# Patient Record
Sex: Female | Born: 1995 | Race: Black or African American | Hispanic: No | Marital: Single | Smoking: Never smoker
Health system: Southern US, Community
[De-identification: ages and names within clinical notes are randomized; demographics above are authoritative.]

## PROBLEM LIST (undated history)

## (undated) ENCOUNTER — Inpatient Hospital Stay (HOSPITAL_COMMUNITY): Payer: Self-pay

## (undated) DIAGNOSIS — Z9109 Other allergy status, other than to drugs and biological substances: Secondary | ICD-10-CM

## (undated) DIAGNOSIS — L309 Dermatitis, unspecified: Secondary | ICD-10-CM

## (undated) DIAGNOSIS — K8020 Calculus of gallbladder without cholecystitis without obstruction: Secondary | ICD-10-CM

## (undated) DIAGNOSIS — J302 Other seasonal allergic rhinitis: Secondary | ICD-10-CM

## (undated) DIAGNOSIS — B999 Unspecified infectious disease: Secondary | ICD-10-CM

## (undated) DIAGNOSIS — K5900 Constipation, unspecified: Secondary | ICD-10-CM

## (undated) DIAGNOSIS — R011 Cardiac murmur, unspecified: Secondary | ICD-10-CM

## (undated) DIAGNOSIS — B9689 Other specified bacterial agents as the cause of diseases classified elsewhere: Secondary | ICD-10-CM

## (undated) DIAGNOSIS — N760 Acute vaginitis: Secondary | ICD-10-CM

## (undated) HISTORY — PX: TONSILLECTOMY: SUR1361

## (undated) HISTORY — PX: ADENOIDECTOMY: SUR15

## (undated) HISTORY — PX: THERAPEUTIC ABORTION: SHX798

## (undated) HISTORY — DX: Dermatitis, unspecified: L30.9

---
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
## 2016-02-17 IMAGING — CR DG ABDOMEN ACUTE W/ 1V CHEST
3 series · 3 of 3 positions shown · non-contrast
Comparison: None.

CLINICAL DATA: Abdominal pain, headache

EXAM:
DG ABDOMEN ACUTE W/ 1V CHEST

[w chest pa]
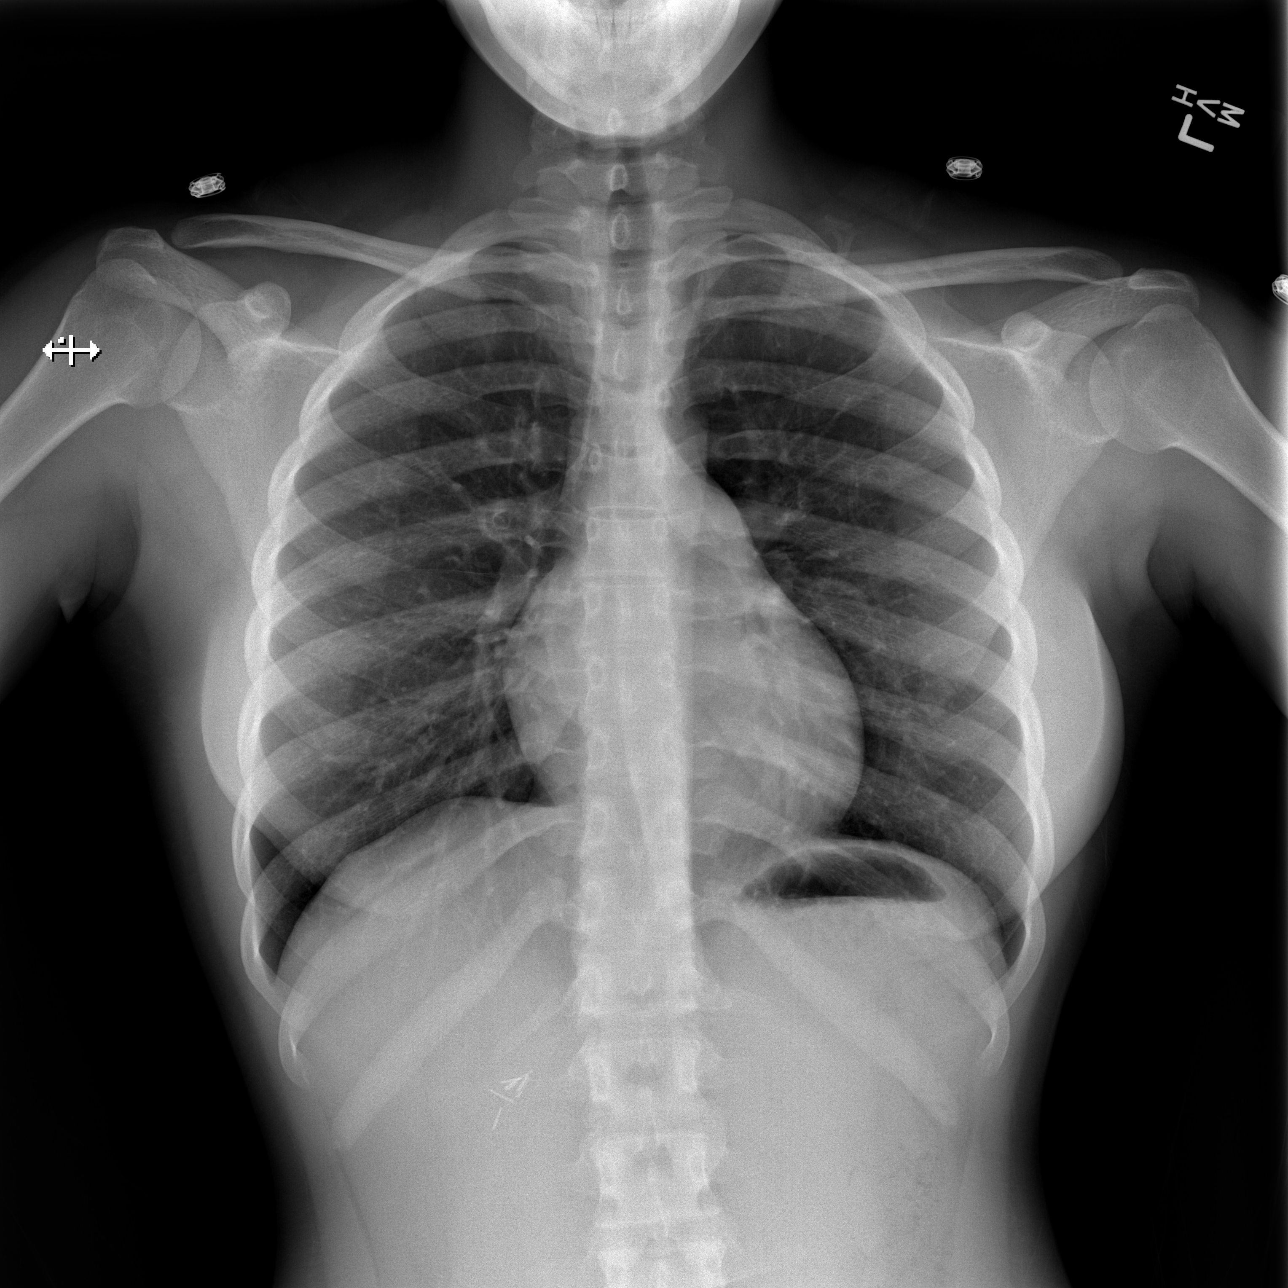

[w abdomen upright]
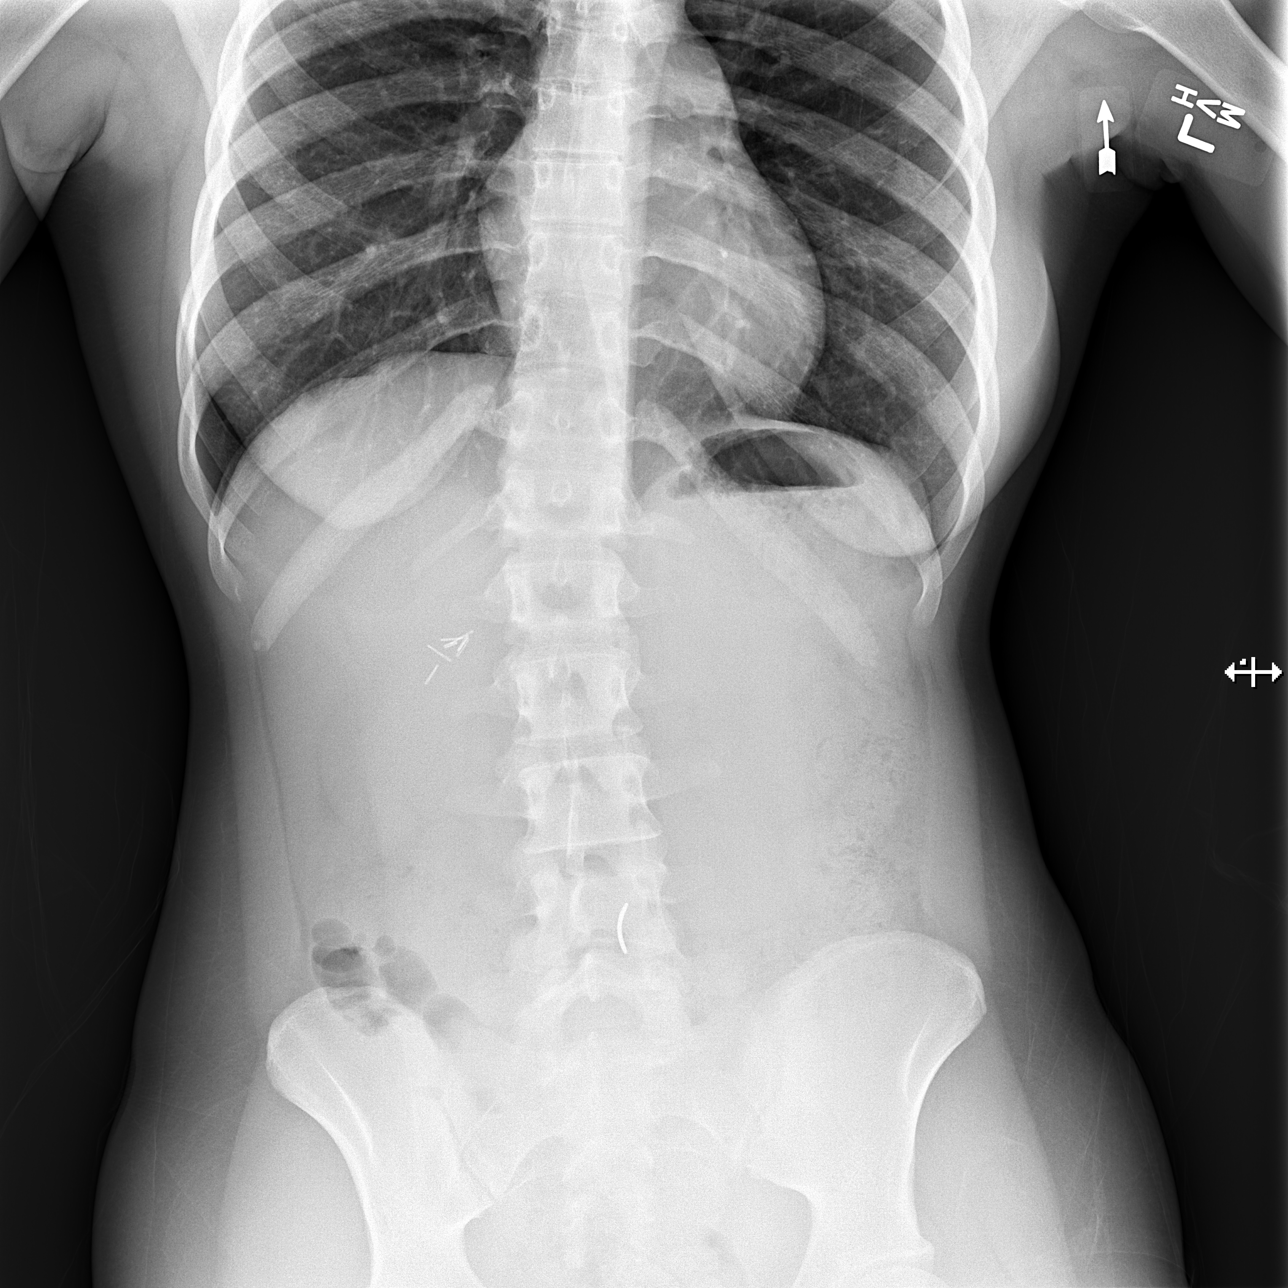

[t abdomen supine]
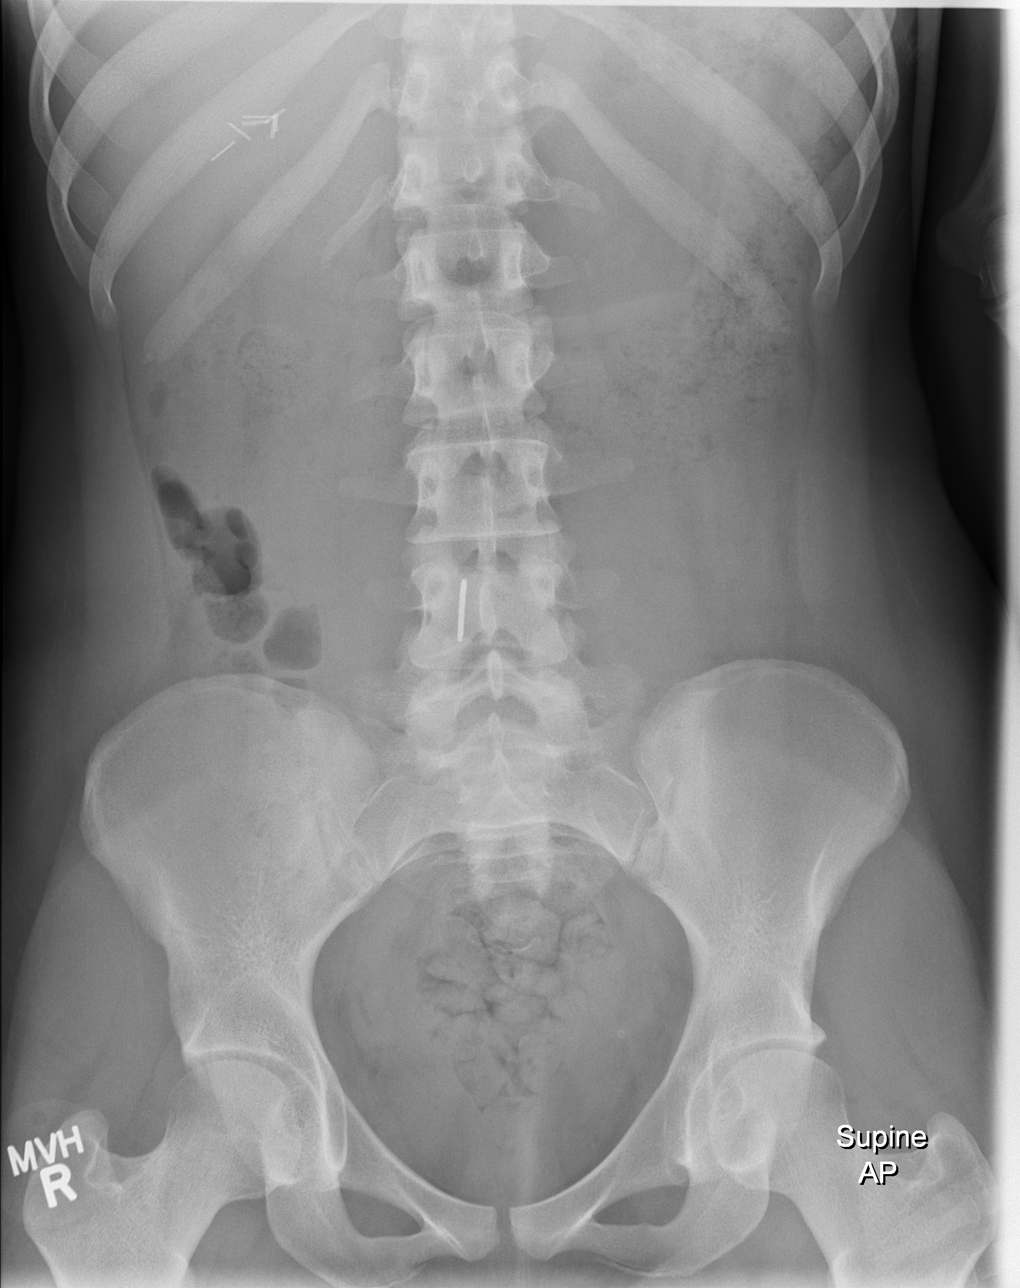

[3 of 3 positions shown; findings below may reference images not displayed]

FINDINGS: There is no evidence of dilated bowel loops or free intraperitoneal
air. Heart size and mediastinal contours are within normal limits.
Both lungs are clear. Postcholecystectomy surgical clips are noted.
Moderate stool noted in transverse colon. There is a metallic
artifact in mid lower abdomen is measures 1.9 cm. This may be in
umbilical region. Clinical correlation is necessary. Moderate stool
noted within rectum.
IMPRESSION: Moderate stool noted in transverse colon and rectum.
Postcholecystectomy surgical clips are noted. Linear metallic
artifact is noted in mid lower abdomen measures about 1.9 cm length.
This may be in umbilical region or represent a foreign body.
Clinical correlation is necessary No acute cardiopulmonary disease.

## 2016-03-21 ENCOUNTER — Encounter (HOSPITAL_COMMUNITY): Payer: Self-pay

## 2016-03-21 ENCOUNTER — Emergency Department (HOSPITAL_COMMUNITY)
Admission: EM | Admit: 2016-03-21 | Discharge: 2016-03-21 | Disposition: A | Discharge status: Home / Self Care | Payer: No Typology Code available for payment source | Attending: Emergency Medicine | Admitting: Emergency Medicine

## 2016-03-21 DIAGNOSIS — B379 Candidiasis, unspecified: Secondary | ICD-10-CM | POA: Diagnosis not present

## 2016-03-21 DIAGNOSIS — N3000 Acute cystitis without hematuria: Secondary | ICD-10-CM | POA: Diagnosis not present

## 2016-03-21 DIAGNOSIS — Z79899 Other long term (current) drug therapy: Secondary | ICD-10-CM | POA: Diagnosis not present

## 2016-03-21 DIAGNOSIS — R1030 Lower abdominal pain, unspecified: Secondary | ICD-10-CM | POA: Diagnosis present

## 2016-03-21 LAB — CBC
HEMATOCRIT: 39.9 % (ref 36.0–46.0)
HEMOGLOBIN: 13.8 g/dL (ref 12.0–15.0)
MCH: 31.3 pg (ref 26.0–34.0)
MCHC: 34.6 g/dL (ref 30.0–36.0)
MCV: 90.5 fL (ref 78.0–100.0)
Platelets: 224 10*3/uL (ref 150–400)
RBC: 4.41 MIL/uL (ref 3.87–5.11)
RDW: 12.6 % (ref 11.5–15.5)
WBC: 5.7 10*3/uL (ref 4.0–10.5)

## 2016-03-21 LAB — COMPREHENSIVE METABOLIC PANEL
ALBUMIN: 4.6 g/dL (ref 3.5–5.0)
ALT: 23 U/L (ref 14–54)
ANION GAP: 7 (ref 5–15)
AST: 24 U/L (ref 15–41)
Alkaline Phosphatase: 61 U/L (ref 38–126)
BILIRUBIN TOTAL: 0.6 mg/dL (ref 0.3–1.2)
BUN: 8 mg/dL (ref 6–20)
CO2: 25 mmol/L (ref 22–32)
Calcium: 9.4 mg/dL (ref 8.9–10.3)
Chloride: 107 mmol/L (ref 101–111)
Creatinine, Ser: 0.76 mg/dL (ref 0.44–1.00)
GFR calc non Af Amer: >60 mL/min (ref >60)
GLUCOSE: 95 mg/dL (ref 65–99)
POTASSIUM: 3.8 mmol/L (ref 3.5–5.1)
SODIUM: 139 mmol/L (ref 135–145)
TOTAL PROTEIN: 7.4 g/dL (ref 6.5–8.1)

## 2016-03-21 LAB — URINALYSIS, ROUTINE W REFLEX MICROSCOPIC
Glucose, UA: NEGATIVE mg/dL
Hgb urine dipstick: NEGATIVE
Ketones, ur: NEGATIVE mg/dL
NITRITE: POSITIVE — AB
PH: 7 (ref 5.0–8.0)
Protein, ur: NEGATIVE mg/dL
SPECIFIC GRAVITY, URINE: 1.028 (ref 1.005–1.030)

## 2016-03-21 LAB — URINE MICROSCOPIC-ADD ON

## 2016-03-21 LAB — POC URINE PREG, ED: PREG TEST UR: NEGATIVE

## 2016-03-21 LAB — LIPASE, BLOOD: Lipase: 25 U/L (ref 11–51)

## 2016-03-21 MED ORDER — CIPROFLOXACIN HCL 500 MG PO TABS: 500.0000 mg | ORAL_TABLET | Freq: Two times a day (BID) | ORAL | 0 refills | Status: DC

## 2016-03-21 NOTE — Discharge Instructions (Signed)
Please take antibiotics as directed. Please follow-up with her OB/GYN for reevaluation further management of her ongoing symptoms.

## 2016-03-21 NOTE — ED Triage Notes (Signed)
Pt presents with c/o lower abdominal pain for over a week. Pt reports that she has cramping pain in her lower abdomen with some nausea. Pt reports that she also has bilateral flank pain. Pt reports her urine was dark brown last night and yellow with a brown tint this morning. Pt denies any hx of kidney stones. Pt was recently diagnosed with BV last week.

## 2016-03-21 NOTE — ED Notes (Signed)
Urine sample collected in triage 

## 2016-03-21 NOTE — ED Provider Notes (Signed)
[facility]-EMERGENCY DEPT Provider Note   CSN: [id number] Arrival date & time: 03/21/16  1025     History   Chief Complaint Chief Complaint  Patient presents with  . Abdominal Pain    HPI Andrea Carson is a 20 y.o. female.  HPI   20 year old female presents today with complaints of abdominal cramping and dysuria. Patient reports approximately 2 weeks ago she started having dark colored urine and urinary frequency. She reports symptoms continue to persist and now is having very minor abdominal cramping. Patient reports she has vague back pain associated with cramping, denies any fever or chills, nausea or vomiting. Patient reports that she gets frequent bacterial vaginosis infections, most recently stopped taking antibiotics several days ago. She reports that every time she treated for BV she develops vaginal discharge consistent with yeast infection. She notes that she has 2 pills of fluconazole home that she supposed to take. Patient denies any abnormal vaginal discharge, pelvic pain.     Past Medical History:  Diagnosis Date  . Constipation   . Eczema   . Environmental allergies   . Gall stones   . Seasonal allergies     Patient Active Problem List   Diagnosis Date Noted  . Cholelithiasis with acute cholecystitis 07/30/2014  . Indication for care in labor and delivery, antepartum 05/31/2014  . NSVD (normal spontaneous vaginal delivery) 05/31/2014    Past Surgical History:  Procedure Laterality Date  . ADENOIDECTOMY    . CHOLECYSTECTOMY N/A 07/31/2014   Procedure: LAPAROSCOPIC CHOLECYSTECTOMY WITH INTRAOPERATIVE CHOLANGIOGRAM;  Surgeon: Avel Peace, MD;  Location: [facility];  Service: General;  Laterality: N/A;  . TONSILLECTOMY      OB History    Gravida Para Term Preterm AB Living   1 1 1  0 0 1   SAB TAB Ectopic Multiple Live Births   0 0 0 0 1       Home Medications    Prior to Admission medications   Medication Sig Start Date End Date Taking? Authorizing  Provider  EPINEPHrine 0.3 mg/0.3 mL IJ SOAJ injection Inject 0.3 mg into the muscle once as needed (For anaphylaxis.).  11/09/15  Yes Historical Provider, MD  etonogestrel (NEXPLANON) 68 MG IMPL implant 1 each by Subdermal route once.   Yes Historical Provider, MD  ibuprofen (ADVIL,MOTRIN) 200 MG tablet Take 400 mg by mouth every 6 (six) hours as needed for headache.   Yes Historical Provider, MD  azithromycin (ZITHROMAX) 250 MG tablet Take 1 tablet (250 mg total) by mouth daily. Take first 2 tablets together day 1 PO, then 1 every day until finished. Patient not taking: Reported on 03/21/2016 12/01/15   [name], PA-C  ciprofloxacin (CIPRO) 500 MG tablet Take 1 tablet (500 mg total) by mouth every 12 (twelve) hours. 03/21/16   Eyvonne Mechanic, PA-C  ibuprofen (ADVIL,MOTRIN) 600 MG tablet Take 1 tablet (600 mg total) by mouth every 6 (six) hours as needed. Patient not taking: Reported on 03/21/2016 12/01/15   [name], PA-C  polyethylene glycol powder (GLYCOLAX/MIRALAX) powder Take 17 g by mouth daily. Patient not taking: Reported on 03/21/2016 10/30/15   Danelle Berry, PA-C    Family History Family History  Problem Relation Age of Onset  . Hypertension Maternal Grandmother     Social History Social History  Substance Use Topics  . Smoking status: Never Smoker  . Smokeless tobacco: Never Used  . Alcohol use No     Allergies   Bee venom and Amoxicillin   Review of  Systems Review of Systems  All other systems reviewed and are negative.   Physical Exam Updated Vital Signs BP 131/79   Pulse 75   Temp 98.7 F (37.1 C) (Oral)   Resp 18   Ht 5' (1.524 m)   Wt 66.2 kg   LMP 03/03/2016 (Approximate)   SpO2 100%   BMI 28.51 kg/m   Physical Exam  Constitutional: She is oriented to person, place, and time. She appears well-developed and well-nourished.  HENT:  Head: Normocephalic and atraumatic.  Eyes: Conjunctivae are normal. Pupils are equal, round, and  reactive to light. Right eye exhibits no discharge. Left eye exhibits no discharge. No scleral icterus.  Neck: Normal range of motion. No JVD present. No tracheal deviation present.  Pulmonary/Chest: Effort normal. No stridor.  Abdominal: Soft. She exhibits no distension and no mass. There is tenderness. There is no rebound and no guarding. No hernia.  No CVA tenderness, very minor discomfort palpation of the suprapubic region  Neurological: She is alert and oriented to person, place, and time. Coordination normal.  Psychiatric: She has a normal mood and affect. Her behavior is normal. Judgment and thought content normal.  Nursing note and vitals reviewed.    ED Treatments / Results  Labs (all labs ordered are listed, but only abnormal results are displayed) Labs Reviewed  URINALYSIS, ROUTINE W REFLEX MICROSCOPIC ([facility]) - Abnormal; Notable for the following:       Result Value   Color, Urine RED (*)    APPearance CLOUDY (*)    Bilirubin Urine SMALL (*)    Nitrite POSITIVE (*)    Leukocytes, UA MODERATE (*)    All other components within normal limits  URINE MICROSCOPIC-ADD ON - Abnormal; Notable for the following:    Squamous Epithelial / LPF TOO NUMEROUS TO COUNT (*)    Bacteria, UA MANY (*)    All other components within normal limits  LIPASE, BLOOD  COMPREHENSIVE METABOLIC PANEL  CBC  POC URINE PREG, ED    EKG  EKG Interpretation None       Radiology No results found.  Procedures Procedures (including critical care time)  Medications Ordered in ED Medications - No data to display   Initial Impression / Assessment and Plan / ED Course  I have reviewed the triage vital signs and the nursing notes.  Pertinent labs & imaging results that were available during my care of the patient were reviewed by me and considered in my medical decision making (see chart for details).  Clinical Course      Final Clinical Impressions(s) / ED Diagnoses   Final  diagnoses:  Acute cystitis without hematuria  Yeast infection    Labs:Lipase, CMP, CBC, urinalysis  Imaging:  Consults:  Therapeutics:  Discharge Meds:   Assessment/Plan:   Patient's presentation is most consistent with cystitis. Patient has urinalysis consistent with this. She has vague complaints of back pain, no CVA tenderness afebrile nontoxic with no elevation in her white blood cells. Patient has no systemic signs that would indicate pyelonephritis, but with vague back pain complaints in 2 weeks of dysuria she will be treated with Cipro to cover for Pilo. Patient also having vaginal discharge, she reports this is normal after antibiotic exposure, an consistent with her yeast infections. She denies any abnormal complaints, and refused pelvic exam. Patient has fluconazole at home, she is instructed to take antibiotics for urinary tract infection followed by fluconazole as needed for yeast infection. She is instructed to follow-up  with her primary care for reevaluation, return to emergency room immediately if she expresses any new or worsening signs or symptoms. She verbalized understanding and agreement today's plan had no further questions or concerns   . New Prescriptions Discharge Medication List as of 03/21/2016  3:09 PM       Eyvonne Mechanic, PA-C 03/21/16 1906    Pricilla Loveless, MD 03/25/16 [phone]

## 2016-05-20 ENCOUNTER — Encounter (HOSPITAL_COMMUNITY): Payer: Self-pay | Admitting: *Deleted

## 2016-05-20 ENCOUNTER — Emergency Department (HOSPITAL_COMMUNITY)
Admission: EM | Admit: 2016-05-20 | Discharge: 2016-05-20 | Disposition: A | Discharge status: Home / Self Care | Payer: No Typology Code available for payment source | Attending: Emergency Medicine | Admitting: Emergency Medicine

## 2016-05-20 DIAGNOSIS — Z79899 Other long term (current) drug therapy: Secondary | ICD-10-CM | POA: Insufficient documentation

## 2016-05-20 DIAGNOSIS — Y939 Activity, unspecified: Secondary | ICD-10-CM | POA: Insufficient documentation

## 2016-05-20 DIAGNOSIS — S0083XA Contusion of other part of head, initial encounter: Secondary | ICD-10-CM | POA: Diagnosis not present

## 2016-05-20 DIAGNOSIS — Y92410 Unspecified street and highway as the place of occurrence of the external cause: Secondary | ICD-10-CM | POA: Insufficient documentation

## 2016-05-20 DIAGNOSIS — S0993XA Unspecified injury of face, initial encounter: Secondary | ICD-10-CM | POA: Diagnosis present

## 2016-05-20 DIAGNOSIS — Y999 Unspecified external cause status: Secondary | ICD-10-CM | POA: Insufficient documentation

## 2016-05-20 MED ORDER — CYCLOBENZAPRINE HCL 5 MG PO TABS: 5.0000 mg | ORAL_TABLET | Freq: Three times a day (TID) | ORAL | 0 refills | Status: DC | PRN

## 2016-05-20 MED ORDER — CYCLOBENZAPRINE HCL 10 MG PO TABS
10.0000 mg | ORAL_TABLET | Freq: Once | ORAL | Status: AC
  Administered 2016-05-20: 10 mg via ORAL
  Filled 2016-05-20: qty 1

## 2016-05-20 MED ORDER — NAPROXEN 375 MG PO TABS: 375.0000 mg | ORAL_TABLET | Freq: Two times a day (BID) | ORAL | 0 refills | Status: DC
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
## 2016-05-25 IMAGING — CR DG ABDOMEN 1V
1 series · 1 of 1 positions shown · non-contrast
Comparison: 11/26/2014

CLINICAL DATA: Mid abdominal pain for 1 month. History of
constipation.

EXAM:
ABDOMEN - 1 VIEW

[abdomen kub]
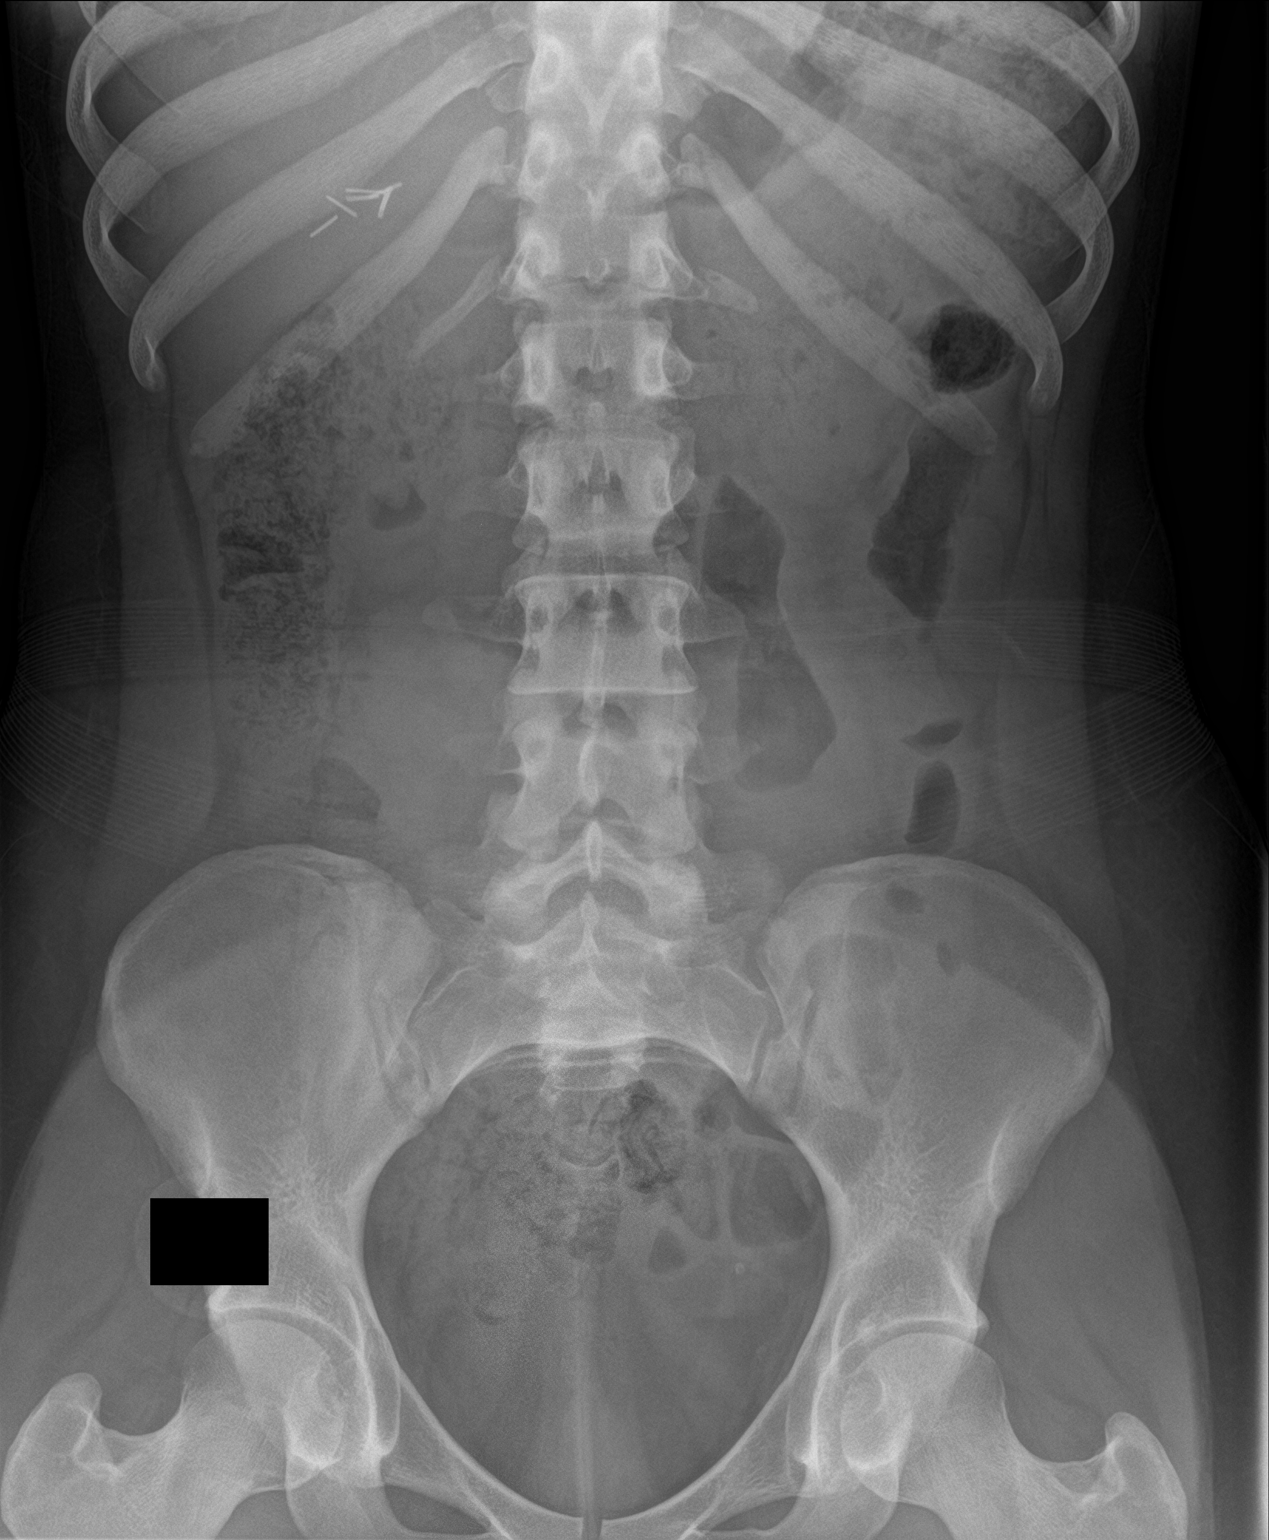

[1 of 1 positions shown; findings below may reference images not displayed]

FINDINGS: Moderate stool throughout the colon, similar to prior study. Prior
cholecystectomy. There is a non obstructive bowel gas pattern. No
supine evidence of free air. No organomegaly or suspicious
calcification.No acute bony abnormality.
IMPRESSION: Stable moderate stool burden.  No acute findings.

## 2016-07-16 ENCOUNTER — Encounter (HOSPITAL_COMMUNITY): Payer: Self-pay | Admitting: Vascular Surgery

## 2016-07-16 ENCOUNTER — Emergency Department (HOSPITAL_COMMUNITY)
Admission: EM | Admit: 2016-07-16 | Discharge: 2016-07-16 | Disposition: A | Discharge status: Home / Self Care | Payer: Self-pay | Attending: Emergency Medicine | Admitting: Emergency Medicine

## 2016-07-16 DIAGNOSIS — B3731 Acute candidiasis of vulva and vagina: Secondary | ICD-10-CM

## 2016-07-16 DIAGNOSIS — B373 Candidiasis of vulva and vagina: Secondary | ICD-10-CM | POA: Insufficient documentation

## 2016-07-16 LAB — URINALYSIS, ROUTINE W REFLEX MICROSCOPIC
Bilirubin Urine: NEGATIVE
GLUCOSE, UA: NEGATIVE mg/dL
HGB URINE DIPSTICK: NEGATIVE
Ketones, ur: NEGATIVE mg/dL
NITRITE: NEGATIVE
Protein, ur: NEGATIVE mg/dL
SPECIFIC GRAVITY, URINE: 1.019 (ref 1.005–1.030)
pH: 8 (ref 5.0–8.0)

## 2016-07-16 LAB — WET PREP, GENITAL
CLUE CELLS WET PREP: NONE SEEN
Sperm: NONE SEEN
Trich, Wet Prep: NONE SEEN
Yeast Wet Prep HPF POC: NONE SEEN

## 2016-07-16 LAB — POC URINE PREG, ED: Preg Test, Ur: NEGATIVE

## 2016-07-16 MED ORDER — FLUCONAZOLE 100 MG PO TABS
150.0000 mg | ORAL_TABLET | Freq: Once | ORAL | Status: AC
  Administered 2016-07-16: 150 mg via ORAL
  Filled 2016-07-16: qty 2

## 2016-07-16 NOTE — ED Triage Notes (Signed)
Pt reports to the ED for eval of vaginal itching and irritation. States that she and her boyfriend used some KY jelly the next day she developed these symptoms. She states she has sensitive skin there and often develops irritation. Pt denies any new sexual partners.

## 2016-07-16 NOTE — ED Provider Notes (Signed)
[facility]ERG[facility] DEPT Provider Note   CSN: [id number] Arrival date & time: 07/16/16 1843     History    Chief Complaint  Patient presents with  . Vaginal Itching     HPI Chancy Milroykera Soderholm is a 21 y.o. female.  20yo F who p/w Vaginal itching. Pt endorses 2-3 days of vaginal itching and irritation ever since she and her boyfriend use KY jelly during intercourse. She does have a history of skin sensitivity. She denies any abnormal vaginal discharge or bleeding. No abdominal pain, fevers, or vomiting. She otherwise feels well. She is sexually active with one partner, sometimes uses condoms.   Past Medical History:  Diagnosis Date  . Constipation   . Eczema   . Environmental allergies   . Gall stones   . Seasonal allergies      Patient Active Problem List   Diagnosis Date Noted  . Cholelithiasis with acute cholecystitis 07/30/2014  . Indication for care in labor and delivery, antepartum 05/31/2014  . NSVD (normal spontaneous vaginal delivery) 05/31/2014    Past Surgical History:  Procedure Laterality Date  . ADENOIDECTOMY    . CHOLECYSTECTOMY N/A 07/31/2014   Procedure: LAPAROSCOPIC CHOLECYSTECTOMY WITH INTRAOPERATIVE CHOLANGIOGRAM;  Surgeon: Avel Peaceodd Rosenbower, MD;  Location: [facility];  Service: General;  Laterality: N/A;  . TONSILLECTOMY      OB History    Gravida Para Term Preterm AB Living   1 1 1  0 0 1   SAB TAB Ectopic Multiple Live Births   0 0 0 0 1        Home Medications    Prior to Admission medications   Medication Sig Start Date End Date Taking? Authorizing Provider  azithromycin (ZITHROMAX) 250 MG tablet Take 1 tablet (250 mg total) by mouth daily. Take first 2 tablets together day 1 PO, then 1 every day until finished. Patient not taking: Reported on 03/21/2016 12/01/15   [name], PA-C  ciprofloxacin (CIPRO) 500 MG tablet Take 1 tablet (500 mg total) by mouth every 12 (twelve) hours. 03/21/16   [name], PA-C  cyclobenzaprine (FLEXERIL) 5 MG  tablet Take 1 tablet (5 mg total) by mouth 3 (three) times daily as needed for muscle spasms. 05/20/16   Hope Orlene OchM Neese, NP  EPINEPHrine 0.3 mg/0.3 mL IJ SOAJ injection Inject 0.3 mg into the muscle once as needed (For anaphylaxis.).  11/09/15   Historical Provider, MD  etonogestrel (NEXPLANON) 68 MG IMPL implant 1 each by Subdermal route once.    Historical Provider, MD  ibuprofen (ADVIL,MOTRIN) 200 MG tablet Take 400 mg by mouth every 6 (six) hours as needed for headache.    Historical Provider, MD  ibuprofen (ADVIL,MOTRIN) 600 MG tablet Take 1 tablet (600 mg total) by mouth every 6 (six) hours as needed. Patient not taking: Reported on 03/21/2016 12/01/15   [name], PA-C  naproxen (NAPROSYN) 375 MG tablet Take 1 tablet (375 mg total) by mouth 2 (two) times daily. 05/20/16   Hope Orlene OchM Neese, NP  polyethylene glycol powder (GLYCOLAX/MIRALAX) powder Take 17 g by mouth daily. Patient not taking: Reported on 03/21/2016 10/30/15   Danelle BerryLeisa Tapia, PA-C      Family History  Problem Relation Age of Onset  . Hypertension Maternal Grandmother      Social History  Substance Use Topics  . Smoking status: Never Smoker  . Smokeless tobacco: Never Used  . Alcohol use No     Allergies     Bee venom and Amoxicillin    Review of  Systems  10 Systems reviewed and are negative for acute change except as noted in the HPI.   Physical Exam Updated Vital Signs BP 109/57 (BP Location: Right Arm)   Pulse 78   Temp 98.8 F (37.1 C) (Oral)   Resp 16   SpO2 100%   Physical Exam  Constitutional: She is oriented to person, place, and time. She appears well-developed and well-nourished. No distress.  HENT:  Head: Normocephalic and atraumatic.  Moist mucous membranes  Eyes: Conjunctivae are normal. Pupils are equal, round, and reactive to light.  Neck: Neck supple.  Cardiovascular: Normal rate, regular rhythm and normal heart sounds.   No murmur heard. Pulmonary/Chest: Effort normal and  breath sounds normal.  Abdominal: Soft. Bowel sounds are normal. She exhibits no distension. There is no tenderness.  Genitourinary: Vaginal discharge found.  Genitourinary Comments: White, cottage cheese-like discharge in vaginal vault No cervical motion or adnexal tenderness  Musculoskeletal: She exhibits no edema.  Neurological: She is alert and oriented to person, place, and time.  Fluent speech  Skin: Skin is warm and dry.  Psychiatric: She has a normal mood and affect. Judgment normal.  Nursing note and vitals reviewed. Chaperone was present during exam.     ED Treatments / Results  Labs (all labs ordered are listed, but only abnormal results are displayed) Labs Reviewed  WET PREP, GENITAL - Abnormal; Notable for the following:       Result Value   WBC, Wet Prep HPF POC MANY (*)    All other components within normal limits  URINALYSIS, ROUTINE W REFLEX MICROSCOPIC - Abnormal; Notable for the following:    APPearance HAZY (*)    Leukocytes, UA SMALL (*)    Bacteria, UA RARE (*)    Squamous Epithelial / LPF 6-30 (*)    All other components within normal limits  POC URINE PREG, ED  GC/CHLAMYDIA PROBE AMP ([place]) NOT AT [facility]     EKG  EKG Interpretation  Date/Time:    Ventricular Rate:    PR Interval:    QRS Duration:   QT Interval:    QTC Calculation:   R Axis:     Text Interpretation:           Radiology No results found.  Procedures Procedures (including critical care time) Procedures  Medications Ordered in ED  Medications - No data to display   Initial Impression / Assessment and Plan / ED Course  I have reviewed the triage vital signs and the nursing notes.  Pertinent labs that were available during my care of the patient were reviewed by me and considered in my medical decision making (see chart for details).     PT w/ a few days of vaginal itching and irritation. White discharge on exam c/w yeast. Wet prep shows no clue cells or  trich. Given immediate onset of sx after sex, I suspect vaginal candidiasis especially given appearance. Gave diflucan.  Discussed supportive care and instructed to discontinue any scented soaps. Counseled patient on safe sex practices. Reviewed return precautions including fever, abdominal pain, or vomiting. Patient voiced understanding and was discharged in satisfactory condition.  Final Clinical Impressions(s) / ED Diagnoses   Final diagnoses:  None     New Prescriptions   No medications on file       Laurence Spates, MD 07/16/16 2159

## 2016-07-18 LAB — GC/CHLAMYDIA PROBE AMP (~~LOC~~) NOT AT ARMC
CHLAMYDIA, DNA PROBE: NEGATIVE
NEISSERIA GONORRHEA: NEGATIVE

## 2017-02-14 ENCOUNTER — Inpatient Hospital Stay (HOSPITAL_COMMUNITY)
Admission: AD | Admit: 2017-02-14 | Discharge: 2017-02-14 | Disposition: A | Discharge status: Home / Self Care | Payer: Self-pay | Source: Ambulatory Visit | Attending: Family Medicine | Admitting: Family Medicine

## 2017-02-14 ENCOUNTER — Encounter (HOSPITAL_COMMUNITY): Payer: Self-pay | Admitting: *Deleted

## 2017-02-14 DIAGNOSIS — O219 Vomiting of pregnancy, unspecified: Secondary | ICD-10-CM | POA: Insufficient documentation

## 2017-02-14 DIAGNOSIS — Z3A08 8 weeks gestation of pregnancy: Secondary | ICD-10-CM | POA: Insufficient documentation

## 2017-02-14 LAB — URINALYSIS, ROUTINE W REFLEX MICROSCOPIC
Bilirubin Urine: NEGATIVE
Glucose, UA: NEGATIVE mg/dL
Hgb urine dipstick: NEGATIVE
Ketones, ur: 80 mg/dL — AB
Leukocytes, UA: NEGATIVE
Nitrite: NEGATIVE
PROTEIN: 30 mg/dL — AB
Specific Gravity, Urine: 1.03 (ref 1.005–1.030)
pH: 5 (ref 5.0–8.0)

## 2017-02-14 LAB — POCT PREGNANCY, URINE: PREG TEST UR: POSITIVE — AB

## 2017-02-14 MED ORDER — PROMETHAZINE HCL 25 MG/ML IJ SOLN
25.0000 mg | Freq: Once | INTRAVENOUS | Status: AC
  Administered 2017-02-14: 25 mg via INTRAVENOUS
  Filled 2017-02-14: qty 1

## 2017-02-14 MED ORDER — DOXYLAMINE-PYRIDOXINE 10-10 MG PO TBEC: DELAYED_RELEASE_TABLET | ORAL | 0 refills | Status: DC

## 2017-02-14 MED ORDER — PROMETHAZINE HCL 25 MG RE SUPP: 25.0000 mg | Freq: Four times a day (QID) | RECTAL | 0 refills | Status: DC | PRN

## 2017-02-14 NOTE — MAU Provider Note (Signed)
Chief Complaint: Nausea and Emesis   SUBJECTIVE HPI: Andrea Carson is a 21 y.o. G2P1001 at [redacted]w[redacted]d who presents to MAU with nausea vomiting. She states that for the last 2 days she has been super sick. She states that she always gets hyperemesis gravidarum with her pregnancies. Has not taken any medications. She does have a Rx for promethazine but states that it does not work so has not taken it in over a week. Patient states that yesterday she had approximately 11 episodes of emesis. She denies any blood. She has emesis with food and drink. Says that she also started to develop a headache after she was throwing up. Denies headache at this time. She has not started prenatal care yet. Denies abdominal pain, fevers, vaginal discharge.  Patient's last menstrual period was 12/22/2016.   Past Medical History:  Diagnosis Date  . Constipation   . Eczema   . Environmental allergies   . Gall stones   . Seasonal allergies    OB History  Gravida Para Term Preterm AB Living  0 0 1  SAB TAB Ectopic Multiple Live Births  0 0 0 0 1    # Outcome Date GA Lbr Len/2nd Weight Sex Delivery Anes PTL Lv  2 Current           1 Term 05/31/14 [redacted]w[redacted]d 06:31 / 00:50 2.92 kg (6 lb 7 oz) F Vag-Spont EPI  LIV     Past Surgical History:  Procedure Laterality Date  . ADENOIDECTOMY    . CHOLECYSTECTOMY N/A 07/31/2014   Procedure: LAPAROSCOPIC CHOLECYSTECTOMY WITH INTRAOPERATIVE CHOLANGIOGRAM;  Surgeon: Avel Peace, MD;  Location: [facility];  Service: General;  Laterality: N/A;  . TONSILLECTOMY     Social History   Social History  . Marital status: Single    Spouse name: N/A  . Number of children: N/A  . Years of education: N/A   Occupational History  . Not on file.   Social History Main Topics  . Smoking status: Never Smoker  . Smokeless tobacco: Never Used  . Alcohol use No  . Drug use: No  . Sexual activity: Yes   Other Topics Concern  . Not on file   Social History Narrative  . No narrative  on file   No current facility-administered medications on file prior to encounter.    Current Outpatient Prescriptions on File Prior to Encounter  Medication Sig Dispense Refill  . azithromycin (ZITHROMAX) 250 MG tablet Take 1 tablet (250 mg total) by mouth daily. Take first 2 tablets together day 1 PO, then 1 every day until finished. (Patient not taking: Reported on 03/21/2016) 6 tablet 0  . ciprofloxacin (CIPRO) 500 MG tablet Take 1 tablet (500 mg total) by mouth every 12 (twelve) hours. 14 tablet 0  . cyclobenzaprine (FLEXERIL) 5 MG tablet Take 1 tablet (5 mg total) by mouth 3 (three) times daily as needed for muscle spasms. 30 tablet 0  . EPINEPHrine 0.3 mg/0.3 mL IJ SOAJ injection Inject 0.3 mg into the muscle once as needed (For anaphylaxis.).     Marland Kitchen etonogestrel (NEXPLANON) 68 MG IMPL implant 1 each by Subdermal route once.    Marland Kitchen ibuprofen (ADVIL,MOTRIN) 200 MG tablet Take 400 mg by mouth every 6 (six) hours as needed for headache.    . ibuprofen (ADVIL,MOTRIN) 600 MG tablet Take 1 tablet (600 mg total) by mouth every 6 (six) hours as needed. (Patient not taking: Reported on 03/21/2016) 30 tablet 0  . naproxen (NAPROSYN)  375 MG tablet Take 1 tablet (375 mg total) by mouth 2 (two) times daily. 20 tablet 0  . polyethylene glycol powder (GLYCOLAX/MIRALAX) powder Take 17 g by mouth daily. (Patient not taking: Reported on 03/21/2016) 255 g 0   Allergies  Allergen Reactions  . Bee Venom Anaphylaxis and Other (See Comments)    Pt has an epipen  . Amoxicillin Rash    Has patient had a PCN reaction causing immediate rash, facial/tongue/throat swelling, SOB or lightheadedness with hypotension: Yes Has patient had a PCN reaction causing severe rash involving mucus membranes or skin necrosis: No Has patient had a PCN reaction that required hospitalization No Has patient had a PCN reaction occurring within the last 10 years: No  If all of the above answers are "NO", then may proceed with  Cephalosporin use.     I have reviewed the past Medical Hx, Surgical Hx, Social Hx, Allergies and Medications.   REVIEW OF SYSTEMS All systems reviewed and are negative for acute change except as noted in the HPI.   OBJECTIVE BP 119/73 (BP Location: Left Arm)   Pulse 66   Temp 98.5 F (36.9 C) (Oral)   Resp 18   Wt 57.2 kg (126 lb)   LMP 12/22/2016   SpO2 100%   BMI 24.61 kg/m    PHYSICAL EXAM Constitutional: Well-developed, well-nourished female in no acute distress.  Cardiovascular: normal rate and rhythm, pulses intact Respiratory: normal rate and effort.  Throat: o/p clear, tachy mucous membranes GI: Abd soft, non-tender, non-distended. Pos BS x 4 MS: Extremities nontender, no edema, normal ROM Neurologic: Alert and oriented x 4. No focal deficits  LAB RESULTS Results for orders placed or performed during the hospital encounter of 02/14/17 (from the past 72 hour(s))  Urinalysis, Routine w reflex microscopic     Status: Abnormal   Collection Time: 02/14/17 11:33 AM  Result Value Ref Range   Color, Urine YELLOW YELLOW   APPearance CLOUDY (A) CLEAR   Specific Gravity, Urine 1.030 1.005 - 1.030   pH 5.0 5.0 - 8.0   Glucose, UA NEGATIVE NEGATIVE mg/dL   Hgb urine dipstick NEGATIVE NEGATIVE   Bilirubin Urine NEGATIVE NEGATIVE   Ketones, ur 80 (A) NEGATIVE mg/dL   Protein, ur 30 (A) NEGATIVE mg/dL   Nitrite NEGATIVE NEGATIVE   Leukocytes, UA NEGATIVE NEGATIVE   RBC / HPF 0-5 0 - 5 RBC/hpf   WBC, UA 0-5 0 - 5 WBC/hpf   Bacteria, UA RARE (A) NONE SEEN   Squamous Epithelial / LPF 6-30 (A) NONE SEEN   Mucus PRESENT   Pregnancy, urine POC     Status: Abnormal   Collection Time: 02/14/17 11:52 AM  Result Value Ref Range   Preg Test, Ur POSITIVE (A) NEGATIVE    Comment:        THE SENSITIVITY OF THIS METHODOLOGY IS >24 mIU/mL     IMAGING No results found.  MAU COURSE Vitals and nursing notes reviewed I have ordered labs and reviewed them Urine consistent  with presentation of n/v Treatments given in MAU: !L fluid bolus with phenergan Patietn had PO trial after fluids and tolerated without emesis  MDM Plan of care reviewed with patient, including labs and tests ordered and medical treatment.   ASSESSMENT 1. Nausea and vomiting during pregnancy     PLAN Discharge home in stable condition Rx given for phenergan suppository and Diclegis to help with n/v Encouraged patient to set up prenatal care Handout given   Caryl AdaJazma Phelps,  DO [facility], [facility] 02/14/2017, 12:14 PM

## 2017-02-14 NOTE — MAU Note (Signed)
Pt was arguing with significant other when RN entered room. Pt teary eyed but both stopped talking when RN entered

## 2017-02-14 NOTE — MAU Note (Signed)
Last 2 days, has been getting super sick.  Was given an rx for promethazine, doesn't work, hasn't taken it in over a wk.  Just makes her sleep and then she wakes up sick again.

## 2017-02-14 NOTE — Discharge Instructions (Signed)
Morning Sickness °Morning sickness is when you feel sick to your stomach (nauseous) during pregnancy. You may feel sick to your stomach and throw up (vomit). You may feel sick in the morning, but you can feel this way any time of day. Some women feel very sick to their stomach and cannot stop throwing up (hyperemesis gravidarum). °Follow these instructions at home: °· Only take medicines as told by your doctor. °· Take multivitamins as told by your doctor. Taking multivitamins before getting pregnant can stop or lessen the harshness of morning sickness. °· Eat dry toast or unsalted crackers before getting out of bed. °· Eat 5 to 6 small meals a day. °· Eat dry and bland foods like rice and baked potatoes. °· Do not drink liquids with meals. Drink between meals. °· Do not eat greasy, fatty, or spicy foods. °· Have someone cook for you if the smell of food causes you to feel sick or throw up. °· If you feel sick to your stomach after taking prenatal vitamins, take them at night or with a snack. °· Eat protein when you need a snack (nuts, yogurt, cheese). °· Eat unsweetened gelatins for dessert. °· Wear a bracelet used for sea sickness (acupressure wristband). °· Go to a doctor that puts thin needles into certain body points (acupuncture) to improve how you feel. °· Do not smoke. °· Use a humidifier to keep the air in your house free of odors. °· Get lots of fresh air. °Contact a doctor if: °· You need medicine to feel better. °· You feel dizzy or lightheaded. °· You are losing weight. °Get help right away if: °· You feel very sick to your stomach and cannot stop throwing up. °· You pass out (faint). °This information is not intended to replace advice given to you by your health care provider. Make sure you discuss any questions you have with your health care provider. °Document Released: 06/30/2004 Document Revised: 10/29/2015 Document Reviewed: 11/07/2012 °Elsevier Interactive Patient Education © 2017 [employer] °Eating Plan for Hyperemesis Gravidarum °Hyperemesis gravidarum is a severe form of morning sickness. Because this condition causes severe nausea and vomiting, it can lead to dehydration, malnutrition, and weight loss. One way to lessen the symptoms of nausea and vomiting is to follow the eating plan for hyperemesis gravidarum. It is often used along with prescribed medicines to control your symptoms. °What can I do to relieve my symptoms? °Listen to your body. Everyone is different and has different preferences. Find what works best for you. Take any of the following actions that are helpful to you: °· Eat and drink slowly. °· Eat 5-6 small meals daily instead of 3 large meals. °· Eat crackers before you get out of bed in the morning. °· Try having a snack in the middle of the night. °· Starchy foods are usually tolerated well. Examples include cereal, toast, bread, potatoes, pasta, rice, and pretzels. °· Ginger may help with nausea. Add ¼ tsp ground ginger to hot tea or choose ginger tea. °· Try drinking 100% fruit juice or an electrolyte drink. An electrolyte drink contains sodium, potassium, and chloride. °· Continue to take your prenatal vitamins as told by your health care provider. If you are having trouble taking your prenatal vitamins, talk with your health care provider about different options. °· Include at least 1 serving of protein with your meals and snacks. Protein options include meats or poultry, beans, nuts, eggs, and yogurt. Try eating a protein-rich snack before bed. Examples   of these snacks include cheese and crackers or half of a peanut butter or turkey sandwich. °· Consider eliminating foods that trigger your symptoms. These may include spicy foods, coffee, high-fat foods, very sweet foods, and acidic foods. °· Try meals that have more protein combined with bland, salty, lower-fat, and dry foods, such as nuts, seeds, pretzels, crackers, and cereal. °· Talk with your healthcare provider  about starting a supplement of vitamin B6. °· Have fluids that are cold, clear, and carbonated or sour. Examples include lemonade, ginger ale, lemon-lime soda, ice water, and sparkling water. °· Try lemon or mint tea. °· Try brushing your teeth or using a mouth rinse after meals. ° °What should I avoid to reduce my symptoms? °Avoiding some of the following things may help reduce your symptoms. °· Foods with strong smells. Try eating meals in well-ventilated areas that are free of odors. °· Drinking water or other beverages with meals. Try not to drink anything during the 30 minutes before and after your meals. °· Drinking more than 1 cup of fluid at a time. Sometimes using a straw helps. °· Fried or high-fat foods, such as butter and cream sauces. °· Spicy foods. °· Skipping meals as best as you can. Nausea can be more intense on an empty stomach. If you cannot tolerate food at that time, do not force it. Try sucking on ice chips or other frozen items, and make up for missed calories later. °· Lying down within 2 hours after eating. °· Environmental triggers. These may include smoky rooms, closed spaces, rooms with strong smells, warm or humid places, overly loud and noisy rooms, and rooms with motion or flickering lights. °· Quick and sudden changes in your movement. ° °This information is not intended to replace advice given to you by your health care provider. Make sure you discuss any questions you have with your health care provider. °Document Released: 03/20/2007 Document Revised: 01/20/2016 Document Reviewed: 12/22/2015 °Elsevier Interactive Patient Education © 2018 Elsevier Inc. ° °

## 2017-02-14 NOTE — Progress Notes (Signed)
Dr Doroteo GlassmanPhelps in to discuss d/c plan with pt. Written and verbal d/c instructions given and understanding voiced

## 2017-04-16 ENCOUNTER — Encounter (HOSPITAL_COMMUNITY): Payer: Self-pay | Admitting: *Deleted

## 2017-04-16 ENCOUNTER — Inpatient Hospital Stay (HOSPITAL_COMMUNITY)
Admission: AD | Admit: 2017-04-16 | Discharge: 2017-04-16 | Disposition: A | Discharge status: Home / Self Care | Payer: Self-pay | Source: Ambulatory Visit | Attending: Obstetrics & Gynecology | Admitting: Obstetrics & Gynecology

## 2017-04-16 DIAGNOSIS — R109 Unspecified abdominal pain: Secondary | ICD-10-CM

## 2017-04-16 DIAGNOSIS — Z880 Allergy status to penicillin: Secondary | ICD-10-CM | POA: Insufficient documentation

## 2017-04-16 DIAGNOSIS — N946 Dysmenorrhea, unspecified: Secondary | ICD-10-CM | POA: Insufficient documentation

## 2017-04-16 DIAGNOSIS — O26891 Other specified pregnancy related conditions, first trimester: Secondary | ICD-10-CM

## 2017-04-16 LAB — WET PREP, GENITAL
Clue Cells Wet Prep HPF POC: NONE SEEN
SPERM: NONE SEEN
TRICH WET PREP: NONE SEEN
YEAST WET PREP: NONE SEEN

## 2017-04-16 LAB — CBC WITH DIFFERENTIAL/PLATELET
BASOS ABS: 0 10*3/uL (ref 0.0–0.1)
BASOS PCT: 0 %
EOS ABS: 0.1 10*3/uL (ref 0.0–0.7)
EOS PCT: 1 %
HCT: 38 % (ref 36.0–46.0)
Hemoglobin: 13.2 g/dL (ref 12.0–15.0)
Lymphocytes Relative: 36 %
Lymphs Abs: 2.6 10*3/uL (ref 0.7–4.0)
MCH: 32.8 pg (ref 26.0–34.0)
MCHC: 34.7 g/dL (ref 30.0–36.0)
MCV: 94.3 fL (ref 78.0–100.0)
MONO ABS: 0.5 10*3/uL (ref 0.1–1.0)
MONOS PCT: 6 %
Neutro Abs: 4.1 10*3/uL (ref 1.7–7.7)
Neutrophils Relative %: 56 %
PLATELETS: 225 10*3/uL (ref 150–400)
RBC: 4.03 MIL/uL (ref 3.87–5.11)
RDW: 12.6 % (ref 11.5–15.5)
WBC: 7.2 10*3/uL (ref 4.0–10.5)

## 2017-04-16 LAB — URINALYSIS, ROUTINE W REFLEX MICROSCOPIC
Bacteria, UA: NONE SEEN
Bilirubin Urine: NEGATIVE
GLUCOSE, UA: NEGATIVE mg/dL
KETONES UR: NEGATIVE mg/dL
LEUKOCYTES UA: NEGATIVE
NITRITE: NEGATIVE
PH: 6 (ref 5.0–8.0)
PROTEIN: 30 mg/dL — AB
Specific Gravity, Urine: 1.029 (ref 1.005–1.030)

## 2017-04-16 LAB — POCT PREGNANCY, URINE: Preg Test, Ur: NEGATIVE

## 2017-04-16 LAB — HCG, QUANTITATIVE, PREGNANCY: HCG, BETA CHAIN, QUANT, S: 1 m[IU]/mL (ref <5)

## 2017-04-16 MED ORDER — NAPROXEN SODIUM 275 MG PO TABS: 275.0000 mg | ORAL_TABLET | Freq: Two times a day (BID) | ORAL | 0 refills | Status: DC

## 2017-04-16 NOTE — Discharge Instructions (Signed)
Follow up with the health department to discuss birth control, until then be sure to use condoms.

## 2017-04-16 NOTE — MAU Provider Note (Signed)
[facility] Provider [facility]   CSN: [id number] Arrival date & time: 04/16/17  1630     History   Chief Complaint Chief Complaint  Patient presents with  . Vaginal Bleeding  . Abdominal Pain    HPI Andrea Carson is a 21 y.o. [id number] who presents to the MAU with vaginal bleeding. Patient reports that she was pregnant and had EAB September 17. She has not had a period since then and has had unprotected sex. For the past week patient has had abdominal cramping. The bleeding started today. Patient states she has not taken a HPT but when the cramping started today and then passing clots she wanted to be sure she wasn't pregnant and having a miscarriage.   HPI  Past Medical History:  Diagnosis Date  . Constipation   . Eczema   . Environmental allergies   . Gall stones   . Seasonal allergies     Patient Active Problem List   Diagnosis Date Noted  . Cholelithiasis with acute cholecystitis 07/30/2014  . Indication for care in labor and delivery, antepartum 05/31/2014  . NSVD (normal spontaneous vaginal delivery) 05/31/2014    Past Surgical History:  Procedure Laterality Date  . ADENOIDECTOMY    . TONSILLECTOMY      OB History    Gravida Para Term Preterm AB Living   3 1 1  0 1 1   SAB TAB Ectopic Multiple Live Births   0 0 0 0 1       Home Medications    Prior to Admission medications   Medication Sig Start Date End Date Taking? Authorizing Provider  Doxylamine-Pyridoxine 10-10 MG TBEC Two tablets at bedtime on day 1 and 2; if symptoms persist, take 1 tablet in morning and 2 tablets at bedtime on day 3. 02/14/17   Pincus LargePhelps, Jazma Y, DO  EPINEPHrine 0.3 mg/0.3 mL IJ SOAJ injection Inject 0.3 mg into the muscle once as needed (For anaphylaxis.).  11/09/15   [provider]  naproxen sodium (ANAPROX) 275 MG tablet Take 1 tablet (275 mg total) 2 (two) times daily with a meal by mouth. 04/16/17   Janne NapoleonNeese, Hope M, NP  promethazine  (PHENERGAN) 25 MG suppository Place 1 suppository (25 mg total) rectally every 6 (six) hours as needed for nausea or vomiting. 02/14/17   Pincus LargePhelps, Jazma Y, DO  promethazine (PHENERGAN) 25 MG tablet Take 25 mg by mouth every 6 (six) hours as needed for nausea or vomiting.    [provider]    Family History Family History  Problem Relation Age of Onset  . Hypertension Maternal Grandmother     Social History Social History   Tobacco Use  . Smoking status: Never Smoker  . Smokeless tobacco: Never Used  Substance Use Topics  . Alcohol use: No  . Drug use: No     Allergies   Bee venom and Amoxicillin   Review of Systems Review of Systems  Constitutional: Negative for chills and fever.  HENT: Negative.   Eyes: Negative for visual disturbance.  Respiratory: Negative for cough.   Gastrointestinal: Positive for abdominal pain and nausea. Negative for vomiting.  Genitourinary: Positive for frequency and vaginal bleeding. Negative for dysuria and urgency.  Musculoskeletal: Positive for back pain.  Skin: Negative for rash.  Neurological: Positive for headaches.  Psychiatric/Behavioral: Negative for confusion.     Physical Exam Updated Vital Signs BP 96/69   Pulse (!) 18   Temp 98 F (36.7 C)  Resp 18   Ht 5' (1.524 m)   Wt 131 lb (59.4 kg)   LMP 12/22/2016   BMI 25.58 kg/m   Physical Exam  Constitutional: She is oriented to person, place, and time. She appears well-developed and well-nourished. No distress.  HENT:  Head: Normocephalic and atraumatic.  Eyes: EOM are normal.  Neck: Neck supple.  Cardiovascular: Normal rate and regular rhythm.  Pulmonary/Chest: Effort normal and breath sounds normal.  Abdominal: Soft. Normal appearance and bowel sounds are normal. There is no tenderness. There is no CVA tenderness.  Genitourinary: Uterus is not enlarged. Cervix exhibits no motion tenderness. Right adnexum displays no tenderness. Left adnexum displays no  tenderness. There is bleeding (moderate) in the vagina. No foreign body in the vagina.  Musculoskeletal: Normal range of motion.  Neurological: She is alert and oriented to person, place, and time. No cranial nerve deficit.  Skin: Skin is warm and dry.  Psychiatric: She has a normal mood and affect.  Nursing note and vitals reviewed.    ED Treatments / Results  Labs (all labs ordered are listed, but only abnormal results are displayed) Labs Reviewed  WET PREP, GENITAL - Abnormal; Notable for the following components:      Result Value   WBC, Wet Prep HPF POC FEW (*)    All other components within normal limits  URINALYSIS, ROUTINE W REFLEX MICROSCOPIC - Abnormal; Notable for the following components:   APPearance HAZY (*)    Hgb urine dipstick LARGE (*)    Protein, ur 30 (*)    Squamous Epithelial / LPF 6-30 (*)    All other components within normal limits  CBC WITH DIFFERENTIAL/PLATELET  HCG, QUANTITATIVE, PREGNANCY  RPR  HIV ANTIBODY (ROUTINE TESTING)  POCT PREGNANCY, URINE  GC/CHLAMYDIA PROBE AMP ([place]) [facility]    Radiology No results found.  Procedures Procedures (including critical care time)  Medications Ordered in ED Medications - No data to display   Initial Impression / Assessment and Plan / ED Course  I have reviewed the triage vital signs and the nursing notes. 21 y.o. female s/p EAB 02/20/17 here today with heavy vaginal bleeding, stable for d/c without heavy bleeding and Bhcg<1. Discussed with the patient importance of protection during intercourse and need for f/u with the [facility] if she wants birth control other than condoms. Patient agrees with plan. Rx anaprox for cramping.   Final Clinical Impressions(s) / ED Diagnoses   Final diagnoses:  Dysmenorrhea    ED Discharge Orders        Ordered    naproxen sodium (ANAPROX) 275 MG tablet  2 times daily with meals     04/16/17 1805

## 2017-04-16 NOTE — MAU Note (Addendum)
Pt was pregnant in September. Had an Abortion on September 17 . Has not had period since and has had unprotected sex. Pt reports she has ben having abd cramping off and on for 1 week. Pt went to the [facility] and and lot of blood came out.

## 2017-04-17 LAB — GC/CHLAMYDIA PROBE AMP (~~LOC~~) NOT AT ARMC
Chlamydia: NEGATIVE
Neisseria Gonorrhea: NEGATIVE

## 2017-04-17 LAB — RPR: RPR Ser Ql: NONREACTIVE

## 2017-04-17 LAB — HIV ANTIBODY (ROUTINE TESTING W REFLEX): HIV Screen 4th Generation wRfx: NONREACTIVE

## 2017-05-25 ENCOUNTER — Emergency Department (HOSPITAL_COMMUNITY)
Admission: EM | Admit: 2017-05-25 | Discharge: 2017-05-25 | Disposition: A | Discharge status: Home / Self Care | Payer: Self-pay | Attending: Emergency Medicine | Admitting: Emergency Medicine

## 2017-05-25 ENCOUNTER — Other Ambulatory Visit: Payer: Self-pay

## 2017-05-25 ENCOUNTER — Emergency Department (HOSPITAL_COMMUNITY): Payer: Self-pay

## 2017-05-25 ENCOUNTER — Encounter (HOSPITAL_COMMUNITY): Payer: Self-pay | Admitting: Emergency Medicine

## 2017-05-25 DIAGNOSIS — Z3A01 Less than 8 weeks gestation of pregnancy: Secondary | ICD-10-CM

## 2017-05-25 DIAGNOSIS — R197 Diarrhea, unspecified: Secondary | ICD-10-CM

## 2017-05-25 DIAGNOSIS — R112 Nausea with vomiting, unspecified: Secondary | ICD-10-CM

## 2017-05-25 DIAGNOSIS — O9989 Other specified diseases and conditions complicating pregnancy, childbirth and the puerperium: Secondary | ICD-10-CM | POA: Insufficient documentation

## 2017-05-25 LAB — COMPREHENSIVE METABOLIC PANEL
ALK PHOS: 61 U/L (ref 38–126)
ALT: 15 U/L (ref 14–54)
ANION GAP: 5 (ref 5–15)
AST: 19 U/L (ref 15–41)
Albumin: 4.5 g/dL (ref 3.5–5.0)
BUN: 7 mg/dL (ref 6–20)
CALCIUM: 9.1 mg/dL (ref 8.9–10.3)
CO2: 23 mmol/L (ref 22–32)
Chloride: 108 mmol/L (ref 101–111)
Creatinine, Ser: 0.64 mg/dL (ref 0.44–1.00)
GFR calc non Af Amer: >60 mL/min (ref >60)
Glucose, Bld: 89 mg/dL (ref 65–99)
POTASSIUM: 3.8 mmol/L (ref 3.5–5.1)
SODIUM: 136 mmol/L (ref 135–145)
TOTAL PROTEIN: 7 g/dL (ref 6.5–8.1)
Total Bilirubin: 0.5 mg/dL (ref 0.3–1.2)

## 2017-05-25 LAB — URINALYSIS, ROUTINE W REFLEX MICROSCOPIC
Bilirubin Urine: NEGATIVE
Glucose, UA: NEGATIVE mg/dL
Hgb urine dipstick: NEGATIVE
Ketones, ur: NEGATIVE mg/dL
Leukocytes, UA: NEGATIVE
NITRITE: NEGATIVE
Protein, ur: 30 mg/dL — AB
SPECIFIC GRAVITY, URINE: 1.023 (ref 1.005–1.030)
pH: 8 (ref 5.0–8.0)

## 2017-05-25 LAB — CBC
HCT: 38.6 % (ref 36.0–46.0)
HEMOGLOBIN: 13.5 g/dL (ref 12.0–15.0)
MCH: 32.7 pg (ref 26.0–34.0)
MCHC: 35 g/dL (ref 30.0–36.0)
MCV: 93.5 fL (ref 78.0–100.0)
Platelets: 237 10*3/uL (ref 150–400)
RBC: 4.13 MIL/uL (ref 3.87–5.11)
RDW: 12.3 % (ref 11.5–15.5)
WBC: 6.3 10*3/uL (ref 4.0–10.5)

## 2017-05-25 LAB — I-STAT BETA HCG BLOOD, ED (MC, WL, AP ONLY)

## 2017-05-25 LAB — LIPASE, BLOOD: LIPASE: 22 U/L (ref 11–51)

## 2017-05-25 MED ORDER — SODIUM CHLORIDE 0.9 % IV BOLUS (SEPSIS): 1000.0000 mL | Freq: Once | INTRAVENOUS | Status: DC

## 2017-05-25 MED ORDER — PRENATAL COMPLETE 14-0.4 MG PO TABS: 1.0000 | ORAL_TABLET | Freq: Once | ORAL | 0 refills | Status: AC

## 2017-05-25 MED ORDER — DIPHENHYDRAMINE HCL 25 MG PO CAPS
25.0000 mg | ORAL_CAPSULE | Freq: Once | ORAL | Status: AC
  Administered 2017-05-25: 25 mg via ORAL
  Filled 2017-05-25: qty 1

## 2017-05-25 MED ORDER — DIPHENHYDRAMINE HCL 50 MG/ML IJ SOLN
25.0000 mg | Freq: Once | INTRAMUSCULAR | Status: DC
  Filled 2017-05-25: qty 1

## 2017-05-25 NOTE — ED Provider Notes (Signed)
[facility]-EMERGENCY DEPT Provider Note   CSN: [id number] Arrival date & time: 05/25/17  1055     History   Chief Complaint Chief Complaint  Patient presents with  . Diarrhea  . Nausea    HPI Andrea Carson is a 21 y.o. female.  HPI 21 year old African-American female past medical history significant for cholecystitis presents to the emergency department today with complaints of nausea and diarrhea.  Patient states her symptoms have been ongoing and are intermittent for the past week.  Denies any vomiting.  Does report some abdominal cramping with diarrhea however currently denies.  Denies any cramping without diarrhea.  She denies any associated fevers, urinary symptoms, vaginal bleeding or discharge.  Patient states that she is sexually active.  Last period was 1 month ago.  Patient denies any recent antibiotic use, recent travel.  States that she does have a history of cholecystectomy that was removed 3 years ago during her last pregnancy.  Patient reports prior history of hyperemesis during pregnancy.  Patient has not training of her symptoms.  Nothing makes better or worse.  Patient reports passing flatulence.  Pt denies any fever, chill, ha, vision changes, lightheadedness, dizziness, congestion, neck pain, cp, sob, cough, abd pain, v/d, urinary symptoms, melena, hematochezia, lower extremity paresthesias.  Past Medical History:  Diagnosis Date  . Constipation   . Eczema   . Environmental allergies   . Gall stones   . Seasonal allergies     Patient Active Problem List   Diagnosis Date Noted  . Cholelithiasis with acute cholecystitis 07/30/2014  . Indication for care in labor and delivery, antepartum 05/31/2014  . NSVD (normal spontaneous vaginal delivery) 05/31/2014    Past Surgical History:  Procedure Laterality Date  . ADENOIDECTOMY    . CHOLECYSTECTOMY N/A 07/31/2014   Procedure: LAPAROSCOPIC CHOLECYSTECTOMY WITH INTRAOPERATIVE CHOLANGIOGRAM;   Surgeon: Avel Peaceodd Rosenbower, MD;  Location: [facility];  Service: General;  Laterality: N/A;  . TONSILLECTOMY      OB History    Gravida Para Term Preterm AB Living   3 1 1  0 1 1   SAB TAB Ectopic Multiple Live Births   0 0 0 0 1       Home Medications    Prior to Admission medications   Medication Sig Start Date End Date Taking? Authorizing Provider  Doxylamine-Pyridoxine 10-10 MG TBEC Two tablets at bedtime on day 1 and 2; if symptoms persist, take 1 tablet in morning and 2 tablets at bedtime on day 3. Patient not taking: Reported on 05/25/2017 02/14/17   Pincus LargePhelps, Jazma Y, DO  naproxen sodium (ANAPROX) 275 MG tablet Take 1 tablet (275 mg total) 2 (two) times daily with a meal by mouth. Patient not taking: Reported on 05/25/2017 04/16/17   Janne NapoleonNeese, Hope M, NP  promethazine (PHENERGAN) 25 MG suppository Place 1 suppository (25 mg total) rectally every 6 (six) hours as needed for nausea or vomiting. Patient not taking: Reported on 05/25/2017 02/14/17   Pincus LargePhelps, Jazma Y, DO    Family History Family History  Problem Relation Age of Onset  . Hypertension Maternal Grandmother     Social History Social History   Tobacco Use  . Smoking status: Never Smoker  . Smokeless tobacco: Never Used  Substance Use Topics  . Alcohol use: No  . Drug use: No     Allergies   Bee venom; Fire [employer]; and Amoxicillin   Review of Systems Review of Systems  Constitutional: Negative for chills and fever.  HENT:  Negative for congestion.   Eyes: Negative for visual disturbance.  Respiratory: Negative for cough and shortness of breath.   Cardiovascular: Negative for chest pain.  Gastrointestinal: Positive for abdominal pain (with diarrhea), diarrhea and nausea. Negative for blood in stool, rectal pain and vomiting.  Genitourinary: Negative for dysuria, flank pain, frequency, hematuria, urgency, vaginal bleeding and vaginal discharge.  Musculoskeletal: Negative for arthralgias and myalgias.  Skin: Negative  for rash.  Neurological: Negative for dizziness, syncope, weakness, light-headedness, numbness and headaches.  Psychiatric/Behavioral: Negative for sleep disturbance. The patient is not nervous/anxious.      Physical Exam Updated Vital Signs BP 123/73 (BP Location: Right Arm)   Pulse 75   Temp 98.3 F (36.8 C) (Oral)   Resp 18   Ht 5' (1.524 m)   Wt 59 kg (130 lb)   LMP 04/16/2017   SpO2 100%   Breastfeeding? Unknown   BMI 25.39 kg/m   Physical Exam  Constitutional: She is oriented to person, place, and time. She appears well-developed and well-nourished.  Non-toxic appearance. No distress.  Resting comfortably on the bed playing on her cell phone.  HENT:  Head: Normocephalic and atraumatic.  Nose: Nose normal.  Mouth/Throat: Oropharynx is clear and moist.  Eyes: Conjunctivae are normal. Pupils are equal, round, and reactive to light. Right eye exhibits no discharge. Left eye exhibits no discharge.  Neck: Normal range of motion. Neck supple.  Cardiovascular: Normal rate, regular rhythm, normal heart sounds and intact distal pulses. Exam reveals no gallop and no friction rub.  No murmur heard. Pulmonary/Chest: Effort normal and breath sounds normal. No stridor. No respiratory distress. She has no wheezes. She has no rales. She exhibits no tenderness.  Abdominal: Soft. Bowel sounds are normal. There is no tenderness. There is no rigidity, no rebound, no guarding, no CVA tenderness, no tenderness at McBurney's point and negative Murphy's sign.  Musculoskeletal: Normal range of motion. She exhibits no tenderness.  Lymphadenopathy:    She has no cervical adenopathy.  Neurological: She is alert and oriented to person, place, and time.  Skin: Skin is warm and dry. Capillary refill takes less than 2 seconds.  Psychiatric: Her behavior is normal. Judgment and thought content normal.  Nursing note and vitals reviewed.    ED Treatments / Results  Labs (all labs ordered are listed,  but only abnormal results are displayed) Labs Reviewed  URINALYSIS, ROUTINE W REFLEX MICROSCOPIC - Abnormal; Notable for the following components:      Result Value   Protein, ur 30 (*)    All other components within normal limits  I-STAT BETA HCG BLOOD, ED ([facility], [facility], [facility] ONLY) - Abnormal; Notable for the following components:   I-stat hCG, quantitative >2,000.0 (*)    All other components within normal limits  LIPASE, BLOOD  COMPREHENSIVE METABOLIC PANEL  CBC    EKG  EKG Interpretation None       Radiology No results found.  Procedures Procedures (including critical care time)  Medications Ordered in ED Medications  diphenhydrAMINE (BENADRYL) capsule 25 mg (25 mg Oral Given 05/25/17 1514)     Initial Impression / Assessment and Plan / ED Course  I have reviewed the triage vital signs and the nursing notes.  Pertinent labs & imaging results that were available during my care of the patient were reviewed by me and considered in my medical decision making (see chart for details).     Patient presents to the ED with complaints of nausea and diarrhea.  She  reports some abdominal cramping with the diarrhea.  Denies any blood in her stool.  Denies any emesis.  Denies any associated fevers.  Denies any vaginal bleeding or discharge.  Patient is overall well-appearing and nontoxic.  Vital signs are very reassuring.  Patient is afebrile.  Lab work reveals no leukocytosis.  Lipase is normal.  Liver enzymes are normal.  Kidney function is normal.  Electrolytes are reassuring.  Her pregnancy test is positive.  UA shows no signs of infection or asymptomatic bacteriuria.  On exam abdominal exam shows no focal tenderness or signs of peritonitis.  Symptoms have been intermittent over the past week.  Patient has no acute pain at this time.  Doubt obstruction or intra-abdominal infection.  Given normal lipase without pancreatitis.  Ultrasound was ordered that shows a uterine pregnancy  without fetal heart tones at this time.  Recommends ultrasound in 10-14 days to assess for viability.  Note small subchorionic hemorrhage.  Patient is likely very early in her pregnancy.  Does have a history of hyperemesis in her past pregnancy which is likely causing symptoms today.  Have encouraged her to change her diet at home.  Also told her to take B6 and Unisom at night.  Also given her handout on food that can help with diarrhea.  Patient is passing gas.  No signs of peritonitis.  Doubt obstructive pathology.  Did discussed ultrasound findings with patient.  She is able to tolerate p.o. fluids in the ED without any emesis.  No further episodes of diarrhea in the ED.  Patient encouraged start taking prenatal vitamins.  Encouraged her to follow-up with her PCP and OB/GYN doctor.  Have given her strict return precautions if she is not passing gas, is having vomiting or worsening abdominal pain or fevers or vaginal bleeding to return to the ED for evaluation immediately.   Pt is hemodynamically stable, in NAD, & able to ambulate in the ED. Evaluation does not show pathology that would require ongoing emergent intervention or inpatient treatment. I explained the diagnosis to the patient. Pain has been managed & has no complaints prior to dc. Pt is comfortable with above plan and is stable for discharge at this time. All questions were answered prior to disposition. Strict return precautions for f/u to the ED were discussed. Encouraged follow up with PCP.    Final Clinical Impressions(s) / ED Diagnoses   Final diagnoses:  Nausea vomiting and diarrhea  Less than [redacted] weeks gestation of pregnancy    ED Discharge Orders    None       Wallace KellerLeaphart, Marybelle Giraldo T, PA-C 05/25/17 1535    Doug SouJacubowitz, Sam, MD 05/25/17 [phone]

## 2017-05-25 NOTE — ED Triage Notes (Signed)
Pt complaint of diarrhea and nausea for a week; denies vomiting or pain.

## 2017-05-25 NOTE — Discharge Instructions (Signed)
Your lab work has been very reassuring.  You are pregnant.  Very early in your pregnancy.  Have given you a few choices to help with diarrhea.  I would also try B6 3 times a day and Unisom at night for your nausea associated with her pregnancy.  Your ultrasound reveals an early pregnancy without any heartbeat.  Need a follow-up ultrasound in 10-14 days to assess for heartbeat of the fetus.  If you develop any worsening abdominal pain, vaginal bleeding return to the ED for evaluation.  Follow-up with a primary care doctor and OB/GYN doctor.  Have given you a referral.  Start taking prenatal vitamins.

## 2017-08-23 ENCOUNTER — Encounter (HOSPITAL_COMMUNITY): Payer: Self-pay | Admitting: *Deleted

## 2017-08-23 ENCOUNTER — Other Ambulatory Visit: Payer: Self-pay

## 2017-08-23 ENCOUNTER — Emergency Department (HOSPITAL_COMMUNITY)
Admission: EM | Admit: 2017-08-23 | Discharge: 2017-08-23 | Discharge status: Against Medical Advice | Payer: Self-pay | Attending: Emergency Medicine | Admitting: Emergency Medicine

## 2017-08-23 DIAGNOSIS — Z5321 Procedure and treatment not carried out due to patient leaving prior to being seen by health care provider: Secondary | ICD-10-CM | POA: Insufficient documentation

## 2017-08-23 LAB — I-STAT BETA HCG BLOOD, ED (MC, WL, AP ONLY): I-stat hCG, quantitative: <5 m[IU]/mL (ref <5)

## 2017-08-23 NOTE — ED Notes (Signed)
Drew a light green and lavender and sent to main lab

## 2017-08-23 NOTE — ED Triage Notes (Signed)
Pt states she is having abnormal vaginal bleeding, Period on Feb 11 for  9 days, started again on Feb 28 -March 9th another episode of vag bleeding, then followed by spotting on March 11. C/o of abd bloating.

## 2017-08-23 NOTE — ED Notes (Signed)
Pt gave stickers to registration and reported that she was leaving.

## 2017-08-24 ENCOUNTER — Other Ambulatory Visit: Payer: Self-pay

## 2017-08-24 ENCOUNTER — Inpatient Hospital Stay (HOSPITAL_COMMUNITY)
Admission: AD | Admit: 2017-08-24 | Discharge: 2017-08-24 | Disposition: A | Discharge status: Home / Self Care | Payer: Self-pay | Source: Ambulatory Visit | Attending: Obstetrics and Gynecology | Admitting: Obstetrics and Gynecology

## 2017-08-24 ENCOUNTER — Encounter (HOSPITAL_COMMUNITY): Payer: Self-pay | Admitting: Advanced Practice Midwife

## 2017-08-24 DIAGNOSIS — R197 Diarrhea, unspecified: Secondary | ICD-10-CM | POA: Insufficient documentation

## 2017-08-24 DIAGNOSIS — R195 Other fecal abnormalities: Secondary | ICD-10-CM

## 2017-08-24 DIAGNOSIS — R1084 Generalized abdominal pain: Secondary | ICD-10-CM | POA: Insufficient documentation

## 2017-08-24 DIAGNOSIS — R140 Abdominal distension (gaseous): Secondary | ICD-10-CM | POA: Insufficient documentation

## 2017-08-24 DIAGNOSIS — Z9189 Other specified personal risk factors, not elsewhere classified: Secondary | ICD-10-CM

## 2017-08-24 DIAGNOSIS — R1110 Vomiting, unspecified: Secondary | ICD-10-CM | POA: Insufficient documentation

## 2017-08-24 DIAGNOSIS — N926 Irregular menstruation, unspecified: Secondary | ICD-10-CM | POA: Insufficient documentation

## 2017-08-24 LAB — CBC WITH DIFFERENTIAL/PLATELET
Basophils Absolute: 0 10*3/uL (ref 0.0–0.1)
Basophils Relative: 1 %
EOS ABS: 0 10*3/uL (ref 0.0–0.7)
Eosinophils Relative: 0 %
HEMATOCRIT: 37.3 % (ref 36.0–46.0)
HEMOGLOBIN: 13 g/dL (ref 12.0–15.0)
LYMPHS ABS: 2.7 10*3/uL (ref 0.7–4.0)
Lymphocytes Relative: 42 %
MCH: 32.9 pg (ref 26.0–34.0)
MCHC: 34.9 g/dL (ref 30.0–36.0)
MCV: 94.4 fL (ref 78.0–100.0)
Monocytes Absolute: 0.3 10*3/uL (ref 0.1–1.0)
Monocytes Relative: 4 %
NEUTROS ABS: 3.4 10*3/uL (ref 1.7–7.7)
NEUTROS PCT: 53 %
Platelets: 191 10*3/uL (ref 150–400)
RBC: 3.95 MIL/uL (ref 3.87–5.11)
RDW: 12.8 % (ref 11.5–15.5)
WBC: 6.5 10*3/uL (ref 4.0–10.5)

## 2017-08-24 LAB — URINALYSIS, ROUTINE W REFLEX MICROSCOPIC
BILIRUBIN URINE: NEGATIVE
GLUCOSE, UA: NEGATIVE mg/dL
HGB URINE DIPSTICK: NEGATIVE
KETONES UR: NEGATIVE mg/dL
Leukocytes, UA: NEGATIVE
Nitrite: NEGATIVE
PROTEIN: NEGATIVE mg/dL
Specific Gravity, Urine: 1.01 (ref 1.005–1.030)
pH: 8.5 — ABNORMAL HIGH (ref 5.0–8.0)

## 2017-08-24 LAB — COMPREHENSIVE METABOLIC PANEL
ALBUMIN: 4.3 g/dL (ref 3.5–5.0)
ALT: 49 U/L (ref 14–54)
AST: 42 U/L — ABNORMAL HIGH (ref 15–41)
Alkaline Phosphatase: 56 U/L (ref 38–126)
Anion gap: 7 (ref 5–15)
BUN: 9 mg/dL (ref 6–20)
CO2: 25 mmol/L (ref 22–32)
CREATININE: 0.72 mg/dL (ref 0.44–1.00)
Calcium: 9 mg/dL (ref 8.9–10.3)
Chloride: 103 mmol/L (ref 101–111)
GFR calc non Af Amer: >60 mL/min (ref >60)
GLUCOSE: 82 mg/dL (ref 65–99)
Potassium: 3.7 mmol/L (ref 3.5–5.1)
SODIUM: 135 mmol/L (ref 135–145)
Total Bilirubin: 0.5 mg/dL (ref 0.3–1.2)
Total Protein: 7 g/dL (ref 6.5–8.1)

## 2017-08-24 LAB — WET PREP, GENITAL
CLUE CELLS WET PREP: NONE SEEN
Sperm: NONE SEEN
Trich, Wet Prep: NONE SEEN
Yeast Wet Prep HPF POC: NONE SEEN

## 2017-08-24 LAB — POCT PREGNANCY, URINE: Preg Test, Ur: NEGATIVE

## 2017-08-24 MED ORDER — SIMETHICONE 80 MG PO CHEW: 80.0000 mg | CHEWABLE_TABLET | Freq: Four times a day (QID) | ORAL | 0 refills | Status: DC | PRN

## 2017-08-24 NOTE — MAU Provider Note (Signed)
Chief Complaint:  Abdominal Pain; Abdominal Cramping; and Back Pain   First Provider Initiated Contact with Patient 08/24/17 2113      HPI: Andrea Carson is a 22 y.o. [id number] who presents to maternity admissions reporting abdominal cramping and bloating.  Worried she has PID.  States thinks boyfriend is cheating. Has had irregular bleeding but does admit to taking Plan B a week or two ago.  Had an abortion in December. Has had loose stools for a week and intermittent nausea and vomiting.  She reports vaginal bleeding, but no vaginal itching/burning, urinary symptoms, h/a, dizziness, or fever/chills.    Abdominal Pain  This is a new problem. The current episode started 1 to 4 weeks ago. The onset quality is gradual. The problem occurs intermittently. The problem has been unchanged. The pain is located in the generalized abdominal region. The pain is moderate. The quality of the pain is cramping and colicky. The abdominal pain does not radiate. Associated symptoms include diarrhea, nausea and vomiting. Pertinent negatives include no constipation, dysuria, fever, frequency, headaches or myalgias. Nothing aggravates the pain. The pain is relieved by nothing. She has tried nothing for the symptoms.  Abdominal Cramping  This is a new problem. The current episode started 1 to 4 weeks ago. The onset quality is gradual. The problem occurs intermittently. The pain is located in the generalized abdominal region. The quality of the pain is colicky and cramping. The abdominal pain does not radiate. Associated symptoms include diarrhea, nausea and vomiting. Pertinent negatives include no constipation, dysuria, fever, frequency, headaches or myalgias.  Back Pain  This is a new problem. The current episode started 1 to 4 weeks ago. The problem occurs intermittently. The problem has been waxing and waning since onset. The pain is present in the lumbar spine. Associated symptoms include abdominal pain and pelvic pain.  Pertinent negatives include no dysuria, fever, headaches, numbness, paresis, tingling or weakness.   RN note: Pt presents to MAU c/o abdominal swelling. Pt states she had some unusual bleeding after her period. Pt states she had her normal period in Feb then 3days later she started having heavy bleeding that bleeding subsided then more bleeding started after she had intercourse. Pt states the pain is in her lower abdomen she states it is a cramping feeling that comes and goes. Pt denies bleeding now. No d/c. Pt reports pain between her shoulder blades that comes and goes.     Past Medical History: Past Medical History:  Diagnosis Date  . Constipation   . Eczema   . Environmental allergies   . Gall stones   . Seasonal allergies     Past obstetric history: OB History  Gravida Para Term Preterm AB Living  3 1 1  0 2 1  SAB TAB Ectopic Multiple Live Births  0 2 0 0 1    # Outcome Date GA Lbr Len/2nd Weight Sex Delivery Anes PTL Lv  3 TAB 05/26/17 [redacted]w[redacted]d         2 TAB 02/20/17 [redacted]w[redacted]d         1 Term 05/31/14 [redacted]w[redacted]d 06:31 / 00:50 6 lb 7 oz (2.92 kg) F Vag-Spont EPI  LIV    Past Surgical History: Past Surgical History:  Procedure Laterality Date  . ADENOIDECTOMY    . CHOLECYSTECTOMY N/A 07/31/2014   Procedure: LAPAROSCOPIC CHOLECYSTECTOMY WITH INTRAOPERATIVE CHOLANGIOGRAM;  Surgeon: Avel Peace, MD;  Location: [facility];  Service: General;  Laterality: N/A;  . TONSILLECTOMY      Family History:  Family History  Problem Relation Age of Onset  . Hypertension Maternal Grandmother     Social History: Social History   Tobacco Use  . Smoking status: Never Smoker  . Smokeless tobacco: Never Used  Substance Use Topics  . Alcohol use: No  . Drug use: Yes    Types: Marijuana    Comment: last use 22 Aug 2017    Allergies:  Allergies  Allergen Reactions  . Bee Venom Anaphylaxis and Other (See Comments)    Pt has an epipen  . Fire [employer] Anaphylaxis  . Amoxicillin Rash    CHILDHOOD  ALLERGY Has patient had a PCN reaction causing immediate rash, facial/tongue/throat swelling, SOB or lightheadedness with hypotension: Yes Has patient had a PCN reaction causing severe rash involving mucus membranes or skin necrosis: No Has patient had a PCN reaction that required hospitalization No Has patient had a PCN reaction occurring within the last 10 years: No  If all of the above answers are "NO", then may proceed with Cephalosporin use.     Meds:  Medications Prior to Admission  Medication Sig Dispense Refill Last Dose  . Doxylamine-Pyridoxine 10-10 MG TBEC Two tablets at bedtime on day 1 and 2; if symptoms persist, take 1 tablet in morning and 2 tablets at bedtime on day 3. (Patient not taking: Reported on 05/25/2017) 60 tablet 0 More than a month at Unknown time  . naproxen sodium (ANAPROX) 275 MG tablet Take 1 tablet (275 mg total) 2 (two) times daily with a meal by mouth. (Patient not taking: Reported on 05/25/2017) 20 tablet 0 More than a month at Unknown time  . promethazine (PHENERGAN) 25 MG suppository Place 1 suppository (25 mg total) rectally every 6 (six) hours as needed for nausea or vomiting. (Patient not taking: Reported on 05/25/2017) 12 each 0 More than a month at Unknown time    I have reviewed patient's Past Medical Hx, Surgical Hx, Family Hx, Social Hx, medications and allergies.  ROS:  Review of Systems  Constitutional: Negative for fever.  Gastrointestinal: Positive for abdominal pain, diarrhea, nausea and vomiting. Negative for constipation.  Genitourinary: Positive for pelvic pain. Negative for dysuria and frequency.  Musculoskeletal: Positive for back pain. Negative for myalgias.  Neurological: Negative for tingling, weakness, numbness and headaches.   Other systems negative     Physical Exam   Patient Vitals for the past 24 hrs:  BP Temp Temp src Pulse Resp SpO2 Height Weight  08/24/17 2024 (!) 113/58 98.7 F (37.1 C) Oral 98 17 100 % 5' (1.524  m) 136 lb 0.6 oz (61.7 kg)   Constitutional: Well-developed, well-nourished female in no acute distress.  Cardiovascular: normal rate and rhythm Respiratory: normal effort, no distress.  GI: Abd soft, non-tender.  Nondistended.  No rebound, No guarding.  Bowel Sounds audible  MS: Extremities nontender, no edema, normal ROM Neurologic: Alert and oriented x 4.   Grossly nonfocal. GU: Neg CVAT. Skin:  Warm and Dry Psych:  Affect appropriate.  PELVIC EXAM: Cervix pink, visually closed, without lesion, scant white creamy discharge, vaginal walls and external genitalia normal Bimanual exam: Cervix firm, anterior, mild CMT, uterus mildly tender, nonenlarged, adnexa without tenderness, enlargement, or mass    Labs: Results for orders placed or performed during the hospital encounter of 08/24/17 (from the past 24 hour(s))  Urinalysis, Routine w reflex microscopic     Status: Abnormal   Collection Time: 08/24/17  8:25 PM  Result Value Ref Range   Color, Urine YELLOW YELLOW  APPearance CLEAR CLEAR   Specific Gravity, Urine 1.010 1.005 - 1.030   pH 8.5 (H) 5.0 - 8.0   Glucose, UA NEGATIVE NEGATIVE mg/dL   Hgb urine dipstick NEGATIVE NEGATIVE   Bilirubin Urine NEGATIVE NEGATIVE   Ketones, ur NEGATIVE NEGATIVE mg/dL   Protein, ur NEGATIVE NEGATIVE mg/dL   Nitrite NEGATIVE NEGATIVE   Leukocytes, UA NEGATIVE NEGATIVE  Pregnancy, urine POC     Status: None   Collection Time: 08/24/17  8:36 PM  Result Value Ref Range   Preg Test, Ur NEGATIVE NEGATIVE  Wet prep, genital     Status: Abnormal   Collection Time: 08/24/17  9:30 PM  Result Value Ref Range   Yeast Wet Prep HPF POC NONE SEEN NONE SEEN   Trich, Wet Prep NONE SEEN NONE SEEN   Clue Cells Wet Prep HPF POC NONE SEEN NONE SEEN   WBC, Wet Prep HPF POC FEW (A) NONE SEEN   Sperm NONE SEEN   CBC with Differential/Platelet     Status: None   Collection Time: 08/24/17  9:38 PM  Result Value Ref Range   WBC 6.5 4.0 - 10.5 K/uL   RBC 3.95  3.87 - 5.11 MIL/uL   Hemoglobin 13.0 12.0 - 15.0 g/dL   HCT 29.5 62.1 - 30.8 %   MCV 94.4 78.0 - 100.0 fL   MCH 32.9 26.0 - 34.0 pg   MCHC 34.9 30.0 - 36.0 g/dL   RDW 65.7 84.6 - 96.2 %   Platelets 191 150 - 400 K/uL   Neutrophils Relative % 53 %   Neutro Abs 3.4 1.7 - 7.7 K/uL   Lymphocytes Relative 42 %   Lymphs Abs 2.7 0.7 - 4.0 K/uL   Monocytes Relative 4 %   Monocytes Absolute 0.3 0.1 - 1.0 K/uL   Eosinophils Relative 0 %   Eosinophils Absolute 0.0 0.0 - 0.7 K/uL   Basophils Relative 1 %   Basophils Absolute 0.0 0.0 - 0.1 K/uL  Comprehensive metabolic panel     Status: Abnormal   Collection Time: 08/24/17  9:38 PM  Result Value Ref Range   Sodium 135 135 - 145 mmol/L   Potassium 3.7 3.5 - 5.1 mmol/L   Chloride 103 101 - 111 mmol/L   CO2 25 22 - 32 mmol/L   Glucose, Bld 82 65 - 99 mg/dL   BUN 9 6 - 20 mg/dL   Creatinine, Ser 9.52 0.44 - 1.00 mg/dL   Calcium 9.0 8.9 - 84.1 mg/dL   Total Protein 7.0 6.5 - 8.1 g/dL   Albumin 4.3 3.5 - 5.0 g/dL   AST 42 (H) 15 - 41 U/L   ALT 49 14 - 54 U/L   Alkaline Phosphatase 56 38 - 126 U/L   Total Bilirubin 0.5 0.3 - 1.2 mg/dL   GFR calc non Af Amer >60 >60 mL/min   GFR calc Af Amer >60 >60 mL/min   Anion gap 7 5 - 15      Imaging:  No results found.  MAU Course/MDM: I have ordered labs as follows: CBC and cmet to rule out abdominal processes or infection, UA which is clear of signs of infection.  Cultures and wet prep.  Cultures pending, Wet prep negative Imaging ordered: none Results reviewed. Discussed this bloating and cramping is likely due to gastroenteritis.  Recommend bland diet and probiotics. Will also add mylicon  Pt stable at time of discharge.  Assessment: Generalized abdominal pain - Plan: Discharge patient  Bloating - Plan:  Discharge patient  Loose stools - Plan: Discharge patient  Non-intractable vomiting, presence of nausea not specified, unspecified vomiting type - Plan: Discharge patient  At risk for  sexually transmitted disease due to unprotected sex - Plan: Discharge patient  Irregular menses - Due to multiple ABs and Recent Plan B - Plan: Discharge patient   Plan: Discharge home Recommend probiotics and greek yogurt.  Add Mylicon prn.  Advance diet slowly .  Rx sent for Mylicon  for bloating Discussed safe sex and contraception. Discussed risky unprotected IC can lead to more serious infections also, including HIV Recommend trying health dept for STD clinics and contraception clinics, since she has had 2 abortions and one use of morning after pill in past 6 months.  Encouraged to return here or to other Urgent Care/ED if she develops worsening of symptoms, increase in pain, fever, or other concerning symptoms.   Wynelle BourgeoisMarie Williams CNM, MSN Certified Nurse-Midwife 08/24/2017 10:31 PM

## 2017-08-24 NOTE — MAU Note (Signed)
Pt presents to MAU c/o abdominal swelling. Pt states she had some unusual bleeding after her period. Pt states she had her normal period in Feb then 3days later she started having heavy bleeding that bleeding subsided then more bleeding started after she had intercourse. Pt states the pain is in her lower abdomen she states it is a cramping feeling that comes and goes. Pt denies bleeding now. No d/c. Pt reports pain between her shoulder blades that comes and goes.

## 2017-08-24 NOTE — Discharge Instructions (Signed)
Safe Sex Practicing safe sex means taking steps before and during sex to reduce your risk of:  Getting an STD (sexually transmitted disease).  Giving your partner an STD.  Unwanted pregnancy.  How can I practice safe sex?  To practice safe sex:  Limit your sexual partners to only one partner who is having sex with only you.  Avoid using alcohol and recreational drugs before having sex. These substances can affect your judgment.  Before having sex with a new partner: ? Talk to your partner about past partners, past STDs, and drug use. ? You and your partner should be screened for STDs and discuss the results with each other.  Check your body regularly for sores, blisters, rashes, or unusual discharge. If you notice any of these problems, visit your health care provider.  If you have symptoms of an infection or you are being treated for an STD, avoid sexual contact.  While having sex, use a condom. Make sure to: ? Use a condom every time you have vaginal, oral, or anal sex. Both females and males should wear condoms during oral sex. ? Keep condoms in place from the beginning to the end of sexual activity. ? Use a latex condom, if possible. Latex condoms offer the best protection. ? Use only water-based lubricants or oils to lubricate a condom. Using petroleum-based lubricants or oils will weaken the condom and increase the chance that it will break.  See your health care provider for regular screenings, exams, and tests for STDs.  Talk with your health care provider about the form of birth control (contraception) that is best for you.  Get vaccinated against hepatitis B and human papillomavirus (HPV).  If you are at risk of being infected with HIV (human immunodeficiency virus), talk with your health care provider about taking a prescription medicine to prevent HIV infection. You are considered at risk for HIV if: ? You are a man who has sex with other men. ? You are a  heterosexual man or woman who is sexually active with more than one partner. ? You take drugs by injection. ? You are sexually active with a partner who has HIV.  This information is not intended to replace advice given to you by your health care provider. Make sure you discuss any questions you have with your health care provider. Document Released: 06/30/2004 Document Revised: 10/07/2015 Document Reviewed: 04/12/2015 Elsevier Interactive Patient Education  2018 [place] emergency contraceptive kit What is this medicine? LEVONORGESTREL (LEE voe nor jes trel) is an emergency contraceptive (birth control pill). It prevents pregnancy if taken within the 72 hours after unprotected sex. This medicine will not work if you are already pregnant. This medicine may be used for other purposes; ask your health care provider or pharmacist if you have questions. COMMON BRAND NAME(S): [place] EZ, Fallback Solo, My Way, Next Choice, Next Choice One Dose, [place] One-Step, Plan B, Plan B One-Step, React, Take Action What should I tell my health care provider before I take this medicine? They need to know if you have or ever had any of these conditions: -blood sugar problems, like diabetes -cancer of the breast, cervix, ovary, uterus, vagina, or unusual vaginal bleeding -fibroids -liver disease -menstrual problems -migraine headaches -an unusual or allergic reaction to levonorgestrel, other medicines, foods, dyes, or preservatives -pregnant or trying to get pregnant -breast-feeding How should I use this medicine? Take this medicine by mouth. Your doctor may want you to use a quick-response pregnancy test prior  to using the tablets. Take your medicine as soon as you can after having unprotected sex, preferably in the first 24 hours, but no later than 72 hours (3 days) after the event. Follow the dose instructions of your health care provider exactly. Do not take any extra pills. Extra  pills will not decrease your risk of pregnancy, but may increase your risk of side effects. A patient package insert for the product will be given with each prescription and refill. Read this sheet carefully each time. The sheet may change frequently. Contact your pediatrician regarding the use of this medicine in children. Special care may be needed. This medicine has been used in female children who have started having menstrual periods. Overdosage: If you think you have taken too much of this medicine contact a poison control center or emergency room at once. NOTE: This medicine is only for you. Do not share this medicine with others. What if I miss a dose? If you miss a dose or vomit within 1 hour of taking your dose, you MUST contact your health care professional for instructions. What may interact with this medicine? Do not take this medicine with any of the following medications: -amprenavir -bosentan -fosamprenavir This medicine may also interact with the following medications: -aprepitant -armodafinil -barbiturate medicines for inducing sleep or treating seizures -bexarotene -boceprevir -griseofulvin -medicines to treat seizures like carbamazepine, ethotoin, felbamate, oxcarbazepine, phenytoin, topiramate -modafinil -pioglitazone -rifabutin -rifampin -rifapentine -some medicines to treat HIV infection like atazanavir, efavirenz, indinavir, lopinavir, nelfinavir, tipranavir, ritonavir -St. John's wort -warfarin This list may not describe all possible interactions. Give your health care provider a list of all the medicines, herbs, non-prescription drugs, or dietary supplements you use. Also tell them if you smoke, drink alcohol, or use illegal drugs. Some items may interact with your medicine. What should I watch for while using this medicine? Emergency birth control is not to be used routinely to prevent pregnancy. Discuss birth control options with your health care provider.  Make a follow-up appointment to see your health care provider in 3 to 4 weeks after using this medicine. It is common to have spotting after using this medicine. If you miss your next period, the possibility of pregnancy must be considered. See your health care professional as soon as you can and get a pregnancy test. Smoking increases the risk of getting a blood clot or having a stroke while you are taking birth control pills, especially if you are more than 21 years old. You are strongly advised not to smoke. This medicine does not protect you against HIV infection (AIDS) or any other sexually transmitted diseases. What side effects may I notice from receiving this medicine? Side effects that you should report to your doctor or health care professional as soon as possible: -Severe side effects are not common. However, the potential for severe side effects may exist and you may want to discuss these with your health care provider. Side effects that usually do not require medical attention (report to your doctor or health care professional if they continue or are bothersome): -abdominal pain or cramping -breast tenderness -dizziness -nausea -spotting This list may not describe all possible side effects. Call your doctor for medical advice about side effects. You may report side effects to FDA at 1-800-FDA-1088. Where should I keep my medicine? Keep out of the reach of children. Store at room temperature between 15 and 30 degrees C (59 and 86 degrees F). Throw away any unused medicine after the expiration date.  NOTE: This sheet is a summary. It may not cover all possible information. If you have questions about this medicine, talk to your doctor, pharmacist, or health care provider.  2018 Elsevier/Gold Standard (2016-03-04 13:48:52) Viral Gastroenteritis, Adult Viral gastroenteritis is also known as the stomach flu. This condition is caused by certain germs (viruses). These germs can be passed  from person to person very easily (are very contagious). This condition can cause sudden watery poop (diarrhea), fever, and throwing up (vomiting). Having watery poop and throwing up can make you feel weak and cause you to get dehydrated. Dehydration can make you tired and thirsty, make you have a dry mouth, and make it so you pee (urinate) less often. Older adults and people with other diseases or a weak defense system (immune system) are at higher risk for dehydration. It is important to replace the fluids that you lose from having watery poop and throwing up. Follow these instructions at home: Follow instructions from your doctor about how to care for yourself at home. Eating and drinking  Follow these instructions as told by your doctor:  Take an oral rehydration solution (ORS). This is a drink that is sold at pharmacies and stores.  Drink clear fluids in small amounts as you are able, such as: ? Water. ? Ice chips. ? Diluted fruit juice. ? Low-calorie sports drinks.  Eat bland, easy-to-digest foods in small amounts as you are able, such as: ? Bananas. ? Applesauce. ? Rice. ? Low-fat (lean) meats. ? Toast. ? Crackers.  Avoid fluids that have a lot of sugar or caffeine in them.  Avoid alcohol.  Avoid spicy or fatty foods.  General instructions  Drink enough fluid to keep your pee (urine) clear or pale yellow.  Wash your hands often. If you cannot use soap and water, use hand sanitizer.  Make sure that all people in your home wash their hands well and often.  Rest at home while you get better.  Take over-the-counter and prescription medicines only as told by your doctor.  Watch your condition for any changes.  Take a warm bath to help with any burning or pain from having watery poop.  Keep all follow-up visits as told by your doctor. This is important. Contact a doctor if:  You cannot keep fluids down.  Your symptoms get worse.  You have new symptoms.  You  feel light-headed or dizzy.  You have muscle cramps. Get help right away if:  You have chest pain.  You feel very weak or you pass out (faint).  You see blood in your throw-up.  Your throw-up looks like coffee grounds.  You have bloody or black poop (stools) or poop that look like tar.  You have a very bad headache, a stiff neck, or both.  You have a rash.  You have very bad pain, cramping, or bloating in your belly (abdomen).  You have trouble breathing.  You are breathing very quickly.  Your heart is beating very quickly.  Your skin feels cold and clammy.  You feel confused.  You have pain when you pee.  You have signs of dehydration, such as: ? Dark pee, hardly any pee, or no pee. ? Cracked lips. ? Dry mouth. ? Sunken eyes. ? Sleepiness. ? Weakness. This information is not intended to replace advice given to you by your health care provider. Make sure you discuss any questions you have with your health care provider. Document Released: 11/09/2007 Document Revised: 12/11/2015 Document Reviewed: 01/27/2015 Elsevier  Interactive Patient Education  2017 [employer].

## 2017-08-25 LAB — GC/CHLAMYDIA PROBE AMP (~~LOC~~) NOT AT ARMC
Chlamydia: NEGATIVE
NEISSERIA GONORRHEA: NEGATIVE

## 2017-08-25 LAB — HIV ANTIBODY (ROUTINE TESTING W REFLEX): HIV SCREEN 4TH GENERATION: NONREACTIVE

## 2017-11-15 ENCOUNTER — Encounter (HOSPITAL_COMMUNITY): Payer: Self-pay | Admitting: *Deleted

## 2017-11-15 ENCOUNTER — Other Ambulatory Visit: Payer: Self-pay

## 2017-11-15 ENCOUNTER — Inpatient Hospital Stay (HOSPITAL_COMMUNITY): Payer: Medicaid Other

## 2017-11-15 ENCOUNTER — Inpatient Hospital Stay (HOSPITAL_COMMUNITY)
Admission: AD | Admit: 2017-11-15 | Discharge: 2017-11-15 | Disposition: A | Discharge status: Home / Self Care | Payer: Medicaid Other | Source: Ambulatory Visit | Attending: Obstetrics and Gynecology | Admitting: Obstetrics and Gynecology

## 2017-11-15 ENCOUNTER — Other Ambulatory Visit: Payer: Self-pay | Admitting: Student

## 2017-11-15 DIAGNOSIS — O3481 Maternal care for other abnormalities of pelvic organs, first trimester: Secondary | ICD-10-CM | POA: Insufficient documentation

## 2017-11-15 DIAGNOSIS — Z3A01 Less than 8 weeks gestation of pregnancy: Secondary | ICD-10-CM | POA: Insufficient documentation

## 2017-11-15 DIAGNOSIS — O9989 Other specified diseases and conditions complicating pregnancy, childbirth and the puerperium: Secondary | ICD-10-CM | POA: Diagnosis not present

## 2017-11-15 DIAGNOSIS — R109 Unspecified abdominal pain: Secondary | ICD-10-CM | POA: Diagnosis present

## 2017-11-15 DIAGNOSIS — R51 Headache: Secondary | ICD-10-CM | POA: Insufficient documentation

## 2017-11-15 DIAGNOSIS — O418X10 Other specified disorders of amniotic fluid and membranes, first trimester, not applicable or unspecified: Secondary | ICD-10-CM | POA: Diagnosis not present

## 2017-11-15 DIAGNOSIS — Z880 Allergy status to penicillin: Secondary | ICD-10-CM | POA: Diagnosis not present

## 2017-11-15 DIAGNOSIS — Z8249 Family history of ischemic heart disease and other diseases of the circulatory system: Secondary | ICD-10-CM | POA: Insufficient documentation

## 2017-11-15 DIAGNOSIS — N8312 Corpus luteum cyst of left ovary: Secondary | ICD-10-CM | POA: Insufficient documentation

## 2017-11-15 DIAGNOSIS — O3680X0 Pregnancy with inconclusive fetal viability, not applicable or unspecified: Secondary | ICD-10-CM

## 2017-11-15 DIAGNOSIS — O210 Mild hyperemesis gravidarum: Secondary | ICD-10-CM | POA: Diagnosis present

## 2017-11-15 DIAGNOSIS — O468X1 Other antepartum hemorrhage, first trimester: Secondary | ICD-10-CM | POA: Diagnosis not present

## 2017-11-15 DIAGNOSIS — R197 Diarrhea, unspecified: Secondary | ICD-10-CM | POA: Insufficient documentation

## 2017-11-15 DIAGNOSIS — O26891 Other specified pregnancy related conditions, first trimester: Secondary | ICD-10-CM

## 2017-11-15 LAB — URINALYSIS, ROUTINE W REFLEX MICROSCOPIC
Bilirubin Urine: NEGATIVE
GLUCOSE, UA: NEGATIVE mg/dL
HGB URINE DIPSTICK: NEGATIVE
Ketones, ur: 20 mg/dL — AB
Leukocytes, UA: NEGATIVE
NITRITE: NEGATIVE
Protein, ur: 30 mg/dL — AB
SPECIFIC GRAVITY, URINE: 1.025 (ref 1.005–1.030)
pH: 6 (ref 5.0–8.0)

## 2017-11-15 LAB — COMPREHENSIVE METABOLIC PANEL
ALK PHOS: 49 U/L (ref 38–126)
ALT: 27 U/L (ref 14–54)
AST: 21 U/L (ref 15–41)
Albumin: 4.3 g/dL (ref 3.5–5.0)
Anion gap: 10 (ref 5–15)
BILIRUBIN TOTAL: 0.8 mg/dL (ref 0.3–1.2)
BUN: 6 mg/dL (ref 6–20)
CALCIUM: 9.3 mg/dL (ref 8.9–10.3)
CO2: 22 mmol/L (ref 22–32)
CREATININE: 0.62 mg/dL (ref 0.44–1.00)
Chloride: 104 mmol/L (ref 101–111)
GFR calc Af Amer: >60 mL/min (ref >60)
GFR calc non Af Amer: >60 mL/min (ref >60)
GLUCOSE: 84 mg/dL (ref 65–99)
POTASSIUM: 3.8 mmol/L (ref 3.5–5.1)
Sodium: 136 mmol/L (ref 135–145)
TOTAL PROTEIN: 7.3 g/dL (ref 6.5–8.1)

## 2017-11-15 LAB — WET PREP, GENITAL
Clue Cells Wet Prep HPF POC: NONE SEEN
Sperm: NONE SEEN
Trich, Wet Prep: NONE SEEN
Yeast Wet Prep HPF POC: NONE SEEN

## 2017-11-15 LAB — CBC
HEMATOCRIT: 36.5 % (ref 36.0–46.0)
HEMOGLOBIN: 12.7 g/dL (ref 12.0–15.0)
MCH: 32.6 pg (ref 26.0–34.0)
MCHC: 34.8 g/dL (ref 30.0–36.0)
MCV: 93.6 fL (ref 78.0–100.0)
Platelets: 218 10*3/uL (ref 150–400)
RBC: 3.9 MIL/uL (ref 3.87–5.11)
RDW: 12.6 % (ref 11.5–15.5)
WBC: 6.4 10*3/uL (ref 4.0–10.5)

## 2017-11-15 LAB — GLUCOSE, CAPILLARY: Glucose-Capillary: 80 mg/dL (ref 65–99)

## 2017-11-15 LAB — HCG, QUANTITATIVE, PREGNANCY: hCG, Beta Chain, Quant, S: 38314 m[IU]/mL — ABNORMAL HIGH (ref <5)

## 2017-11-15 LAB — POCT PREGNANCY, URINE: PREG TEST UR: POSITIVE — AB

## 2017-11-15 MED ORDER — DOXYLAMINE-PYRIDOXINE 10-10 MG PO TBEC: DELAYED_RELEASE_TABLET | ORAL | 0 refills | Status: DC

## 2017-11-15 MED ORDER — ONDANSETRON 8 MG PO TBDP
8.0000 mg | ORAL_TABLET | Freq: Once | ORAL | Status: AC
  Administered 2017-11-15: 8 mg via ORAL
  Filled 2017-11-15: qty 1

## 2017-11-15 MED ORDER — DOXYLAMINE-PYRIDOXINE 10-10 MG PO TBEC: DELAYED_RELEASE_TABLET | ORAL | 1 refills | Status: DC

## 2017-11-15 MED ORDER — ONDANSETRON HCL 8 MG PO TABS: 8.0000 mg | ORAL_TABLET | Freq: Three times a day (TID) | ORAL | 0 refills | Status: DC | PRN

## 2017-11-15 NOTE — MAU Note (Signed)
3 days ago started being sick  (vomiting and diarrhea- loose not watery).  Started at 0400.  Could barely eat.  Was a little better yesterday, though still vomiting.  Woke about 0300, stomach was hurting really bad. Still throwing up and having loose stools. Body aches, has a headache and her eyes hurt.

## 2017-11-15 NOTE — MAU Provider Note (Addendum)
History     CSN: [id number]  Arrival date and time: 11/15/17 1116   None     Chief Complaint  Patient presents with  . Abdominal Pain  . Headache  . Emesis  . Diarrhea  . Generalized Body Aches   Andrea Carson is a 22 yo G3P1021 with a LMP of 5/11-5/15 who presents to MAU with 3 day history of N/V, loose stools, abdominal cramping, HA. She states that symptoms started suddenly with N/V 3 days ago. She has had N/V multiple times a day since then, with 6 episodes today. She describes the vomit as green and watery. She states that the smell of food makes her nauseous. She has not been able to keep down food or water for 3 days. She has about 4-5 loose stools a day. She states that last week she had eaten some crab legs with her BF and they both had felt sick for a few days afterwards, but neither had N/V. She states that nothing else has changed recently in her life. She also endorses fatigue and myalgia. She denies fever, cough, SOB, CP. She also states that she has had polyuria for the last 2 months which she states she needs to go to the bathroom every "15 minutes." She denies polyphagia, or polydipsia, or significant weight loss. She has had 2 abortions in the last year, in Oct 2018, which was a procedural abortion, and Dec 2018, which was medical.    OB History    Gravida  4   Para  1   Term  1   Preterm  0   AB  2   Living  1     SAB  0   TAB  2   Ectopic  0   Multiple  0   Live Births  1           Past Medical History:  Diagnosis Date  . Constipation   . Eczema   . Environmental allergies   . Gall stones   . Seasonal allergies     Past Surgical History:  Procedure Laterality Date  . ADENOIDECTOMY    . CHOLECYSTECTOMY N/A 07/31/2014   Procedure: LAPAROSCOPIC CHOLECYSTECTOMY WITH INTRAOPERATIVE CHOLANGIOGRAM;  Surgeon: Avel Peace, MD;  Location: [facility];  Service: General;  Laterality: N/A;  . TONSILLECTOMY      Family History  Problem Relation  Age of Onset  . Hypertension Maternal Grandmother   . Cancer Maternal Grandmother   . Hypertension Mother   . Cancer Maternal Aunt   . Anxiety disorder Maternal Uncle   . Diabetes Paternal Grandmother     Social History   Tobacco Use  . Smoking status: Never Smoker  . Smokeless tobacco: Never Used  Substance Use Topics  . Alcohol use: No  . Drug use: Yes    Types: Marijuana    Comment: last use 22 Aug 2017    Allergies:  Allergies  Allergen Reactions  . Bee Venom Anaphylaxis and Other (See Comments)    Pt has an epipen  . Fire [employer] Anaphylaxis  . Amoxicillin Rash    CHILDHOOD ALLERGY Has patient had a PCN reaction causing immediate rash, facial/tongue/throat swelling, SOB or lightheadedness with hypotension: Yes Has patient had a PCN reaction causing severe rash involving mucus membranes or skin necrosis: No Has patient had a PCN reaction that required hospitalization No Has patient had a PCN reaction occurring within the last 10 years: No  If all of the above answers are "  NO", then may proceed with Cephalosporin use.     Medications Prior to Admission  Medication Sig Dispense Refill Last Dose  . Doxylamine-Pyridoxine 10-10 MG TBEC Two tablets at bedtime on day 1 and 2; if symptoms persist, take 1 tablet in morning and 2 tablets at bedtime on day 3. (Patient not taking: Reported on 05/25/2017) 60 tablet 0 More than a month at Unknown time  . naproxen sodium (ANAPROX) 275 MG tablet Take 1 tablet (275 mg total) 2 (two) times daily with a meal by mouth. (Patient not taking: Reported on 05/25/2017) 20 tablet 0 More than a month at Unknown time  . promethazine (PHENERGAN) 25 MG suppository Place 1 suppository (25 mg total) rectally every 6 (six) hours as needed for nausea or vomiting. (Patient not taking: Reported on 05/25/2017) 12 each 0 More than a month at Unknown time  . simethicone (GAS-X) 80 MG chewable tablet Chew 1 tablet (80 mg total) by mouth every 6 (six) hours as  needed for flatulence. 30 tablet 0     Review of Systems  Constitutional: Positive for appetite change and fatigue. Negative for fever and unexpected weight change.  HENT: Negative.   Eyes: Negative.   Respiratory: Negative for cough, shortness of breath and wheezing.   Cardiovascular: Negative.   Gastrointestinal: Positive for abdominal pain, nausea and vomiting. Negative for blood in stool.  Endocrine: Positive for polyuria. Negative for polydipsia and polyphagia.  Genitourinary: Negative for dysuria, hematuria, urgency, vaginal bleeding, vaginal discharge and vaginal pain.  Musculoskeletal: Positive for myalgias.  Neurological: Positive for headaches. Negative for dizziness and light-headedness.  Psychiatric/Behavioral: Negative.    Physical Exam   Blood pressure 103/60, pulse 74, temperature 98.7 F (37.1 C), temperature source Oral, resp. rate 16, weight 59.3 kg (130 lb 12 oz), last menstrual period 10/14/2017, SpO2 100 %, unknown if currently breastfeeding.  Physical Exam  Constitutional: She is oriented to person, place, and time. She appears well-developed and well-nourished. No distress.  HENT:  Head: Normocephalic and atraumatic.  Right Ear: External ear normal.  Left Ear: External ear normal.  Nose: Nose normal.  Eyes: Pupils are equal, round, and reactive to light. Conjunctivae are normal. Right eye exhibits no discharge. Left eye exhibits no discharge. No scleral icterus.  Neck: Normal range of motion. No tracheal deviation present. No thyromegaly present.  Cardiovascular: Normal rate and regular rhythm. Exam reveals no gallop and no friction rub.  No murmur heard. Respiratory: Effort normal and breath sounds normal. No respiratory distress. She has no wheezes. She has no rales.  GI: Normal appearance. Bowel sounds are increased. There is tenderness in the right lower quadrant, suprapubic area and left lower quadrant. There is no rebound and no guarding.  Genitourinary:  Vagina normal and uterus normal. Uterus is not tender. Cervix exhibits no motion tenderness, no discharge and no friability. Right adnexum displays no mass, no tenderness and no fullness. Left adnexum displays no mass, no tenderness and no fullness. No erythema or bleeding in the vagina. No vaginal discharge found.  Genitourinary Comments: Uterus is gravid.  Musculoskeletal: Normal range of motion.  Neurological: She is alert and oriented to person, place, and time.  Skin: Skin is warm and dry. No rash noted. She is not diaphoretic. No erythema.  Psychiatric: She has a normal mood and affect. Her behavior is normal.    MAU Course  Procedures  MDM Pregnancy test, hCG, CBC, CMP, urinalysis, TVUS ordered for pregnancy/ectopic workup. GC/Chlamydia, Wet prep ordered for abdominal cramping workup.  Blood glucose ordered for diabetes rule out. Patient assessed and determined to be safe and stable for discharge after ectopic workup was negative.   Assessment and Plan   Assessment: Pregnancy  Plan: - Pregnancy test positive- results and education provided to pt - CBC, CMP, urinalysis wnl - hCG results support IUP - TVUS shows IUP at 5w 5d w/ fetal bradycardia (76 bpm) and small subchorionic hemorrhage - Pt education and return precautions provided on fetal bradycardia and subchorionic hemorrhage - Pt to have follow-up U/S in 1 week to reassess fetal heartrate - blood glucose wnl - Wet prep negative - Zofran prescribed for nausea - Patient education and return precautions given    Andrea Carson 11/15/2017, 12:49 PM   I confirm that I have verified the information documented in the medical student's note's note and that I have also personally reperformed the physical exam and all medical decision making activities.  Andrea Carson   Assessment and Plan:  Patient tolerated ginger ale; has had no episodes of  vomiting or diarrhea since she has been in MAU.   Discharge home with RX  for Zofran and  Diclegis. Instructed patient on how to take. Reviewed diet changes for morning sickness.   Patient stable for discharge; all questions answered.

## 2017-11-15 NOTE — Discharge Instructions (Signed)
Morning Sickness Morning sickness is when you feel sick to your stomach (nauseous) during pregnancy. You may feel sick to your stomach and throw up (vomit). You may feel sick in the morning, but you can feel this way any time of day. Some women feel very sick to their stomach and cannot stop throwing up (hyperemesis gravidarum). Follow these instructions at home:  Only take medicines as told by your doctor.  Take multivitamins as told by your doctor. Taking multivitamins before getting pregnant can stop or lessen the harshness of morning sickness.  Eat dry toast or unsalted crackers before getting out of bed.  Eat 5 to 6 small meals a day.  Eat dry and bland foods like rice and baked potatoes.  Do not drink liquids with meals. Drink between meals.  Do not eat greasy, fatty, or spicy foods.  Have someone cook for you if the smell of food causes you to feel sick or throw up.  If you feel sick to your stomach after taking prenatal vitamins, take them at night or with a snack.  Eat protein when you need a snack (nuts, yogurt, cheese).  Eat unsweetened gelatins for dessert.  Wear a bracelet used for sea sickness (acupressure wristband).  Go to a doctor that puts thin needles into certain body points (acupuncture) to improve how you feel.  Do not smoke.  Use a humidifier to keep the air in your house free of odors.  Get lots of fresh air. Contact a doctor if:  You need medicine to feel better.  You feel dizzy or lightheaded.  You are losing weight. Get help right away if:  You feel very sick to your stomach and cannot stop throwing up.  You pass out (faint). This information is not intended to replace advice given to you by your health care provider. Make sure you discuss any questions you have with your health care provider. Document Released: 06/30/2004 Document Revised: 10/29/2015 Document Reviewed: 11/07/2012 Elsevier Interactive Patient Education  2017 Elsevier  Inc. Subchorionic Hematoma A subchorionic hematoma is a gathering of blood between the outer wall of the placenta and the inner wall of the womb (uterus). The placenta is the organ that connects the fetus to the wall of the uterus. The placenta performs the feeding, breathing (oxygen to the fetus), and waste removal (excretory work) of the fetus. Subchorionic hematoma is the most common abnormality found on a result from ultrasonography done during the first trimester or early second trimester of pregnancy. If there has been little or no vaginal bleeding, early small hematomas usually shrink on their own and do not affect your baby or pregnancy. The blood is gradually absorbed over 1-2 weeks. When bleeding starts later in pregnancy or the hematoma is larger or occurs in an older pregnant woman, the outcome may not be as good. Larger hematomas may get bigger, which increases the chances for miscarriage. Subchorionic hematoma also increases the risk of premature detachment of the placenta from the uterus, preterm (premature) labor, and stillbirth. Follow these instructions at home:  Stay on bed rest if your health care provider recommends this. Although bed rest will not prevent more bleeding or prevent a miscarriage, your health care provider may recommend bed rest until you are advised otherwise.  Avoid heavy lifting (more than 10 lb [4.5 kg]), exercise, sexual intercourse, or douching as directed by your health care provider.  Keep track of the number of pads you use each day and how soaked (saturated) they are. Write  down this information.  Do not use tampons.  Keep all follow-up appointments as directed by your health care provider. Your health care provider may ask you to have follow-up blood tests or ultrasound tests or both. Get help right away if:  You have severe cramps in your stomach, back, abdomen, or pelvis.  You have a fever.  You pass large clots or tissue. Save any tissue for your  health care provider to look at.  Your bleeding increases or you become lightheaded, feel weak, or have fainting episodes. This information is not intended to replace advice given to you by your health care provider. Make sure you discuss any questions you have with your health care provider. Document Released: 09/07/2006 Document Revised: 10/29/2015 Document Reviewed: 12/20/2012 Elsevier Interactive Patient Education  2017 Elsevier Inc. Pelvic Rest Pelvic rest may be recommended if:  Your placenta is partially or completely covering the opening of your cervix (placenta previa).  There is bleeding between the wall of the uterus and the amniotic sac in the first trimester of pregnancy (subchorionic hemorrhage).  You went into labor too early (preterm labor).  Based on your overall health and the health of your baby, your health care provider will decide if pelvic rest is right for you. How do I rest my pelvis? For as long as told by your health care provider:  Do not have sex, sexual stimulation, or an orgasm.  Do not use tampons. Do not douche. Do not put anything in your vagina.  Do not lift anything that is heavier than 10 lb (4.5 kg).  Avoid activities that take a lot of effort (are strenuous).  Avoid any activity in which your pelvic muscles could become strained.  When should I seek medical care? Seek medical care if you have:  Cramping pain in your lower abdomen.  Vaginal discharge.  A low, dull backache.  Regular contractions.  Uterine tightening.  When should I seek immediate medical care? Seek immediate medical care if:  You have vaginal bleeding and you are pregnant.  This information is not intended to replace advice given to you by your health care provider. Make sure you discuss any questions you have with your health care provider. Document Released: 09/17/2010 Document Revised: 10/29/2015 Document Reviewed: 11/24/2014 Elsevier Interactive Patient  Education  [employer]2018 Elsevier Inc.

## 2017-11-15 NOTE — Progress Notes (Addendum)
G3P1 @ ? Pregancy. No HPT done. Here for n/v and "not really runny but loose stool".  Last vomitting was water she drank at 0940. Loose stool at 0830. Pain level 5/10 lower abd. "my body is aching" started yesterday. No fever. Denies bleeding.   1235: provider and medical student Fredricka BonineConnor at bs assessing. Pelvic exam and cultures collected.   Instructed pt to use call light once lab finish drawing labs for next test. Pt verbalized understanding.   BS done by tech. FSBS 80  1400: pt taken to U/S by sonographer via ambulatory   1409: Provider placed zofran PO med order. Pt presently in U/S.   1500: relinquished care over to RN Solmon Iceiki to go to lunch  1530: assumed care. Pending d/c orders  1650: d/c orders received. D/c instructions given with pt understanding. Pt left unit via ambulatory with family

## 2017-11-16 LAB — GC/CHLAMYDIA PROBE AMP (~~LOC~~) NOT AT ARMC
CHLAMYDIA, DNA PROBE: NEGATIVE
Neisseria Gonorrhea: NEGATIVE

## 2018-01-16 ENCOUNTER — Inpatient Hospital Stay (HOSPITAL_COMMUNITY): Payer: Medicaid Other

## 2018-01-16 ENCOUNTER — Other Ambulatory Visit: Payer: Self-pay

## 2018-01-16 ENCOUNTER — Inpatient Hospital Stay (HOSPITAL_COMMUNITY)
Admission: AD | Admit: 2018-01-16 | Discharge: 2018-01-16 | Disposition: A | Discharge status: Home / Self Care | Payer: Medicaid Other | Source: Ambulatory Visit | Attending: Obstetrics and Gynecology | Admitting: Obstetrics and Gynecology

## 2018-01-16 ENCOUNTER — Encounter (HOSPITAL_COMMUNITY): Payer: Self-pay | Admitting: *Deleted

## 2018-01-16 DIAGNOSIS — O26891 Other specified pregnancy related conditions, first trimester: Secondary | ICD-10-CM | POA: Insufficient documentation

## 2018-01-16 DIAGNOSIS — Z8249 Family history of ischemic heart disease and other diseases of the circulatory system: Secondary | ICD-10-CM | POA: Insufficient documentation

## 2018-01-16 DIAGNOSIS — R109 Unspecified abdominal pain: Secondary | ICD-10-CM | POA: Insufficient documentation

## 2018-01-16 DIAGNOSIS — O09291 Supervision of pregnancy with other poor reproductive or obstetric history, first trimester: Secondary | ICD-10-CM | POA: Insufficient documentation

## 2018-01-16 DIAGNOSIS — O211 Hyperemesis gravidarum with metabolic disturbance: Secondary | ICD-10-CM | POA: Insufficient documentation

## 2018-01-16 DIAGNOSIS — Z3A01 Less than 8 weeks gestation of pregnancy: Secondary | ICD-10-CM | POA: Insufficient documentation

## 2018-01-16 DIAGNOSIS — R1032 Left lower quadrant pain: Secondary | ICD-10-CM | POA: Diagnosis not present

## 2018-01-16 DIAGNOSIS — O219 Vomiting of pregnancy, unspecified: Secondary | ICD-10-CM

## 2018-01-16 DIAGNOSIS — Z880 Allergy status to penicillin: Secondary | ICD-10-CM | POA: Insufficient documentation

## 2018-01-16 DIAGNOSIS — O26899 Other specified pregnancy related conditions, unspecified trimester: Secondary | ICD-10-CM

## 2018-01-16 HISTORY — DX: Unspecified infectious disease: B99.9

## 2018-01-16 LAB — WET PREP, GENITAL
CLUE CELLS WET PREP: NONE SEEN
Sperm: NONE SEEN
TRICH WET PREP: NONE SEEN
Yeast Wet Prep HPF POC: NONE SEEN

## 2018-01-16 LAB — URINALYSIS, ROUTINE W REFLEX MICROSCOPIC
BILIRUBIN URINE: NEGATIVE
GLUCOSE, UA: NEGATIVE mg/dL
Hgb urine dipstick: NEGATIVE
Ketones, ur: 80 mg/dL — AB
Leukocytes, UA: NEGATIVE
Nitrite: NEGATIVE
Protein, ur: 100 mg/dL — AB
Specific Gravity, Urine: 1.029 (ref 1.005–1.030)
pH: 6 (ref 5.0–8.0)

## 2018-01-16 LAB — CBC WITH DIFFERENTIAL/PLATELET
Basophils Absolute: 0 10*3/uL (ref 0.0–0.1)
Basophils Relative: 0 %
EOS ABS: 0 10*3/uL (ref 0.0–0.7)
Eosinophils Relative: 0 %
HCT: 35.6 % — ABNORMAL LOW (ref 36.0–46.0)
Hemoglobin: 12.7 g/dL (ref 12.0–15.0)
LYMPHS PCT: 21 %
Lymphs Abs: 1.5 10*3/uL (ref 0.7–4.0)
MCH: 33.7 pg (ref 26.0–34.0)
MCHC: 35.7 g/dL (ref 30.0–36.0)
MCV: 94.4 fL (ref 78.0–100.0)
MONO ABS: 0.2 10*3/uL (ref 0.1–1.0)
Monocytes Relative: 2 %
Neutro Abs: 5.7 10*3/uL (ref 1.7–7.7)
Neutrophils Relative %: 77 %
Platelets: 185 10*3/uL (ref 150–400)
RBC: 3.77 MIL/uL — AB (ref 3.87–5.11)
RDW: 12.7 % (ref 11.5–15.5)
WBC: 7.4 10*3/uL (ref 4.0–10.5)

## 2018-01-16 LAB — COMPREHENSIVE METABOLIC PANEL
ALT: 19 U/L (ref 0–44)
AST: 21 U/L (ref 15–41)
Albumin: 4.3 g/dL (ref 3.5–5.0)
Alkaline Phosphatase: 54 U/L (ref 38–126)
Anion gap: 10 (ref 5–15)
BUN: 9 mg/dL (ref 6–20)
CO2: 21 mmol/L — AB (ref 22–32)
Calcium: 9.1 mg/dL (ref 8.9–10.3)
Chloride: 102 mmol/L (ref 98–111)
Creatinine, Ser: 0.63 mg/dL (ref 0.44–1.00)
GFR calc Af Amer: >60 mL/min (ref >60)
GFR calc non Af Amer: >60 mL/min (ref >60)
Glucose, Bld: 84 mg/dL (ref 70–99)
POTASSIUM: 3.7 mmol/L (ref 3.5–5.1)
SODIUM: 133 mmol/L — AB (ref 135–145)
Total Bilirubin: 1.5 mg/dL — ABNORMAL HIGH (ref 0.3–1.2)
Total Protein: 7.2 g/dL (ref 6.5–8.1)

## 2018-01-16 LAB — HCG, QUANTITATIVE, PREGNANCY: hCG, Beta Chain, Quant, S: 25402 m[IU]/mL — ABNORMAL HIGH (ref <5)

## 2018-01-16 MED ORDER — PROMETHAZINE HCL 25 MG PO TABS
12.5000 mg | ORAL_TABLET | Freq: Once | ORAL | Status: AC
  Administered 2018-01-16: 12.5 mg via ORAL
  Filled 2018-01-16: qty 1

## 2018-01-16 MED ORDER — ONDANSETRON HCL 4 MG/2ML IJ SOLN
4.0000 mg | Freq: Once | INTRAMUSCULAR | Status: AC
  Administered 2018-01-16: 4 mg via INTRAVENOUS
  Filled 2018-01-16: qty 2

## 2018-01-16 MED ORDER — PROMETHAZINE HCL 12.5 MG PO TABS: 12.5000 mg | ORAL_TABLET | Freq: Four times a day (QID) | ORAL | 0 refills | Status: DC | PRN

## 2018-01-16 MED ORDER — LACTATED RINGERS IV BOLUS
1000.0000 mL | Freq: Once | INTRAVENOUS | Status: AC
  Administered 2018-01-16: 1000 mL via INTRAVENOUS

## 2018-01-16 MED ORDER — DOXYLAMINE-PYRIDOXINE 10-10 MG PO TBEC: DELAYED_RELEASE_TABLET | ORAL | 1 refills | Status: DC

## 2018-01-16 NOTE — MAU Note (Signed)
+  preg test today, additional labs added. eval ? Residual, new preg, r/o ectopic due to pain.

## 2018-01-16 NOTE — MAU Note (Addendum)
Started throwing up yesterday.   Having pain on left side of abd.  4 loose stools in the morning, then started throwing up. No bm since. No longer preg, had abortion in June

## 2018-01-16 NOTE — Discharge Instructions (Signed)
Eating Plan for Nausea and Vomiting in Pregnancy °Hyperemesis gravidarum is a severe form of morning sickness. Because this condition causes severe nausea and vomiting, it can lead to dehydration, malnutrition, and weight loss. One way to lessen the symptoms of nausea and vomiting is to follow the eating plan for hyperemesis gravidarum. It is often used along with prescribed medicines to control your symptoms. °What can I do to relieve my symptoms? °Listen to your body. Everyone is different and has different preferences. Find what works best for you. Take any of the following actions that are helpful to you: °· Eat and drink slowly. °· Eat 5-6 small meals daily instead of 3 large meals. °· Eat crackers before you get out of bed in the morning. °· Try having a snack in the middle of the night. °· Starchy foods are usually tolerated well. Examples include cereal, toast, bread, potatoes, pasta, rice, and pretzels. °· Ginger may help with nausea. Add ¼ tsp ground ginger to hot tea or choose ginger tea. °· Try drinking 100% fruit juice or an electrolyte drink. An electrolyte drink contains sodium, potassium, and chloride. °· Continue to take your prenatal vitamins as told by your health care provider. If you are having trouble taking your prenatal vitamins, talk with your health care provider about different options. °· Include at least 1 serving of protein with your meals and snacks. Protein options include meats or poultry, beans, nuts, eggs, and yogurt. Try eating a protein-rich snack before bed. Examples of these snacks include cheese and crackers or half of a peanut butter or turkey sandwich. °· Consider eliminating foods that trigger your symptoms. These may include spicy foods, coffee, high-fat foods, very sweet foods, and acidic foods. °· Try meals that have more protein combined with bland, salty, lower-fat, and dry foods, such as nuts, seeds, pretzels, crackers, and cereal. °· Talk with your healthcare  provider about starting a supplement of vitamin B6. °· Have fluids that are cold, clear, and carbonated or sour. Examples include lemonade, ginger ale, lemon-lime soda, ice water, and sparkling water. °· Try lemon or mint tea. °· Try brushing your teeth or using a mouth rinse after meals. ° °What should I avoid to reduce my symptoms? °Avoiding some of the following things may help reduce your symptoms. °· Foods with strong smells. Try eating meals in well-ventilated areas that are free of odors. °· Drinking water or other beverages with meals. Try not to drink anything during the 30 minutes before and after your meals. °· Drinking more than 1 cup of fluid at a time. Sometimes using a straw helps. °· Fried or high-fat foods, such as butter and cream sauces. °· Spicy foods. °· Skipping meals as best as you can. Nausea can be more intense on an empty stomach. If you cannot tolerate food at that time, do not force it. Try sucking on ice chips or other frozen items, and make up for missed calories later. °· Lying down within 2 hours after eating. °· Environmental triggers. These may include smoky rooms, closed spaces, rooms with strong smells, warm or humid places, overly loud and noisy rooms, and rooms with motion or flickering lights. °· Quick and sudden changes in your movement. ° °This information is not intended to replace advice given to you by your health care provider. Make sure you discuss any questions you have with your health care provider. °Document Released: 03/20/2007 Document Revised: 01/20/2016 Document Reviewed: 12/22/2015 °Elsevier Interactive Patient Education © 2018 Elsevier Inc. ° °

## 2018-01-16 NOTE — MAU Provider Note (Signed)
History     CSN: [id number]  Arrival date and time: 01/16/18 [id number]   First Provider Initiated Contact with Patient 01/16/18 0920      Chief Complaint  Patient presents with  . Abdominal Pain  . Emesis   HPI  Ms. Andrea Carson is a 22 y.o. [phone] at 7128w5d who presents to MAU today with complaint of N/V/D since yesterday. She states she should no longer be pregnant and had an abortion in June. She had LMP 12/14/17. She has been sexually active since her last TAB and has used Plan B when condoms were not used. She denies fever, sick contact, or vaginal bleeding. She has not tried anything at home for her symptoms. She has had LLQ abdominal pain that is mild.   OB History    Gravida  5   Para  1   Term  1   Preterm  0   AB  3   Living  1     SAB  0   TAB  3   Ectopic  0   Multiple  0   Live Births  1           Past Medical History:  Diagnosis Date  . Constipation   . Eczema   . Eczema   . Environmental allergies   . Gall stones   . Infection    UTI  . Seasonal allergies     Past Surgical History:  Procedure Laterality Date  . ADENOIDECTOMY    . CHOLECYSTECTOMY N/A 07/31/2014   Procedure: LAPAROSCOPIC CHOLECYSTECTOMY WITH INTRAOPERATIVE CHOLANGIOGRAM;  Surgeon: Avel Peaceodd Rosenbower, MD;  Location: [facility];  Service: General;  Laterality: N/A;  . THERAPEUTIC ABORTION     x3  . TONSILLECTOMY      Family History  Problem Relation Age of Onset  . Hypertension Maternal Grandmother   . Cancer Maternal Grandmother   . Hypertension Mother   . Cancer Maternal Aunt   . Anxiety disorder Maternal Uncle   . Diabetes Paternal Grandmother     Social History   Tobacco Use  . Smoking status: Never Smoker  . Smokeless tobacco: Never Used  Substance Use Topics  . Alcohol use: No  . Drug use: Yes    Frequency: 7.0 times per week    Types: Marijuana    Allergies:  Allergies  Allergen Reactions  . Bee Venom Anaphylaxis and Other (See Comments)    Pt has an  epipen  . Fire [employer] Anaphylaxis  . Amoxicillin Rash    CHILDHOOD ALLERGY Has patient had a PCN reaction causing immediate rash, facial/tongue/throat swelling, SOB or lightheadedness with hypotension: Yes Has patient had a PCN reaction causing severe rash involving mucus membranes or skin necrosis: No Has patient had a PCN reaction that required hospitalization No Has patient had a PCN reaction occurring within the last 10 years: No  If all of the above answers are "NO", then may proceed with Cephalosporin use.     Medications Prior to Admission  Medication Sig Dispense Refill Last Dose  . ondansetron (ZOFRAN) 8 MG tablet Take 1 tablet (8 mg total) by mouth every 8 (eight) hours as needed for nausea or vomiting. 20 tablet 0   . [DISCONTINUED] Doxylamine-Pyridoxine 10-10 MG TBEC Two tablets at bedtime on day 1 and 2; if symptoms persist, take 1 tablet in morning and 2 tablets at bedtime on day 3. 60 tablet 1     Review of Systems  Constitutional: Negative for fever.  Gastrointestinal:  Positive for diarrhea, nausea and vomiting. Negative for abdominal pain and constipation.  Genitourinary: Negative for dysuria, frequency, urgency, vaginal bleeding and vaginal discharge.   Physical Exam   Blood pressure 116/78, pulse 70, temperature 98.7 F (37.1 C), resp. rate 16, last menstrual period 12/14/2017, unknown if currently breastfeeding.  Physical Exam  Nursing note and vitals reviewed. Constitutional: She is oriented to person, place, and time. She appears well-developed and well-nourished. No distress.  HENT:  Head: Normocephalic and atraumatic.  Cardiovascular: Normal rate.  Respiratory: Effort normal.  GI: Soft. She exhibits no distension. There is no tenderness.  Neurological: She is alert and oriented to person, place, and time.  Skin: Skin is warm and dry. No erythema.  Psychiatric: She has a normal mood and affect.    Results for orders placed or performed during the hospital  encounter of 01/16/18 (from the past 24 hour(s))  Urinalysis, Routine w reflex microscopic     Status: Abnormal   Collection Time: 01/16/18  8:45 AM  Result Value Ref Range   Color, Urine YELLOW YELLOW   APPearance HAZY (A) CLEAR   Specific Gravity, Urine 1.029 1.005 - 1.030   pH 6.0 5.0 - 8.0   Glucose, UA NEGATIVE NEGATIVE mg/dL   Hgb urine dipstick NEGATIVE NEGATIVE   Bilirubin Urine NEGATIVE NEGATIVE   Ketones, ur 80 (A) NEGATIVE mg/dL   Protein, ur 716 (A) NEGATIVE mg/dL   Nitrite NEGATIVE NEGATIVE   Leukocytes, UA NEGATIVE NEGATIVE   RBC / HPF 0-5 0 - 5 RBC/hpf   WBC, UA 0-5 0 - 5 WBC/hpf   Bacteria, UA RARE (A) NONE SEEN   Squamous Epithelial / LPF 21-50 0 - 5   Mucus PRESENT   CBC with Differential/Platelet     Status: Abnormal   Collection Time: 01/16/18  9:27 AM  Result Value Ref Range   WBC 7.4 4.0 - 10.5 K/uL   RBC 3.77 (L) 3.87 - 5.11 MIL/uL   Hemoglobin 12.7 12.0 - 15.0 g/dL   HCT 96.7 (L) 89.3 - 81.0 %   MCV 94.4 78.0 - 100.0 fL   MCH 33.7 26.0 - 34.0 pg   MCHC 35.7 30.0 - 36.0 g/dL   RDW 17.5 10.2 - 58.5 %   Platelets 185 150 - 400 K/uL   Neutrophils Relative % 77 %   Neutro Abs 5.7 1.7 - 7.7 K/uL   Lymphocytes Relative 21 %   Lymphs Abs 1.5 0.7 - 4.0 K/uL   Monocytes Relative 2 %   Monocytes Absolute 0.2 0.1 - 1.0 K/uL   Eosinophils Relative 0 %   Eosinophils Absolute 0.0 0.0 - 0.7 K/uL   Basophils Relative 0 %   Basophils Absolute 0.0 0.0 - 0.1 K/uL  Comprehensive metabolic panel     Status: Abnormal   Collection Time: 01/16/18  9:27 AM  Result Value Ref Range   Sodium 133 (L) 135 - 145 mmol/L   Potassium 3.7 3.5 - 5.1 mmol/L   Chloride 102 98 - 111 mmol/L   CO2 21 (L) 22 - 32 mmol/L   Glucose, Bld 84 70 - 99 mg/dL   BUN 9 6 - 20 mg/dL   Creatinine, Ser 2.77 0.44 - 1.00 mg/dL   Calcium 9.1 8.9 - 82.4 mg/dL   Total Protein 7.2 6.5 - 8.1 g/dL   Albumin 4.3 3.5 - 5.0 g/dL   AST 21 15 - 41 U/L   ALT 19 0 - 44 U/L   Alkaline Phosphatase 54 38 -  126 U/L   Total Bilirubin 1.5 (H) 0.3 - 1.2 mg/dL   GFR calc non Af Amer >60 >60 mL/min   GFR calc Af Amer >60 >60 mL/min   Anion gap 10 5 - 15  hCG, quantitative, pregnancy     Status: Abnormal   Collection Time: 01/16/18  9:27 AM  Result Value Ref Range   hCG, Beta Chain, Quant, S 25,402 (H) <5 mIU/mL  Wet prep, genital     Status: Abnormal   Collection Time: 01/16/18 11:16 AM  Result Value Ref Range   Yeast Wet Prep HPF POC NONE SEEN NONE SEEN   Trich, Wet Prep NONE SEEN NONE SEEN   Clue Cells Wet Prep HPF POC NONE SEEN NONE SEEN   WBC, Wet Prep HPF POC FEW (A) NONE SEEN   Sperm NONE SEEN    US Ob Comp Less 14 Wks  Result Date: 01/16/2018 CLINICAL DATA:  Early pregnancy.  Abdominal pain. EXAM: OBSTETRIC <14 WK [place] AND TRANSVAGINAL OB US TECHNIQUE: Both transabdominal and transvaginal ultrasound examinations were performed for complete evaluation of the gestation as well as the maternal uterus, adnexal regions, and pelvic cul-de-sac. Transvaginal technique was performed to assess early pregnancy. COMPARISON:  11/15/2017. FINDINGS: Intrauterine gestational sac: Single Yolk sac:  Yes Embryo:  No MSD: 13.3 mm   6 w   1 d Subchorionic hemorrhage:  None visualized. Maternal uterus/adnexae: Subchorionic hemorrhage: None visualized Right ovary: Normal Left ovary: Normal containing corpus luteal remnant. Other :None Free fluid: A small amount of free fluid is identified within the cul-de-sac and left adnexa. IMPRESSION: 1. Probable early intrauterine gestational sac containing a yolk sac, but no fetal pole, or cardiac activity yet visualized. Recommend follow-up quantitative B-HCG levels and follow-up US in 14 days to confirm and assess viability. This recommendation follows SRU consensus guidelines: Diagnostic Criteria for Nonviable Pregnancy Early in the First Trimester. Malva Limes Med 2013; 161:0960-45. Electronically Signed   By: Signa Kell M.D.   On: 01/16/2018 11:57   US Ob  Transvaginal  Result Date: 01/16/2018 CLINICAL DATA:  Early pregnancy.  Abdominal pain. EXAM: OBSTETRIC <14 WK [place] AND TRANSVAGINAL OB US TECHNIQUE: Both transabdominal and transvaginal ultrasound examinations were performed for complete evaluation of the gestation as well as the maternal uterus, adnexal regions, and pelvic cul-de-sac. Transvaginal technique was performed to assess early pregnancy. COMPARISON:  11/15/2017. FINDINGS: Intrauterine gestational sac: Single Yolk sac:  Yes Embryo:  No MSD: 13.3 mm   6 w   1 d Subchorionic hemorrhage:  None visualized. Maternal uterus/adnexae: Subchorionic hemorrhage: None visualized Right ovary: Normal Left ovary: Normal containing corpus luteal remnant. Other :None Free fluid: A small amount of free fluid is identified within the cul-de-sac and left adnexa. IMPRESSION: 1. Probable early intrauterine gestational sac containing a yolk sac, but no fetal pole, or cardiac activity yet visualized. Recommend follow-up quantitative B-HCG levels and follow-up US in 14 days to confirm and assess viability. This recommendation follows SRU consensus guidelines: Diagnostic Criteria for Nonviable Pregnancy Early in the First Trimester. Malva Limes Med 2013; 409:8119-14. Electronically Signed   By: Signa Kell M.D.   On: 01/16/2018 11:57    MAU Course  Procedures  None  MDM UA today with significant dehydration noted IV LR bolus with 4 mg Zofran IV ordered CBC and CMP today  +UPT today  UA, wet prep, GC/chlamydia, quant hCG, HIV, RPR and [place] today to rule out ectopic pregnancy Patient has not had any episodes of emesis  while in MAU but still complaining of nausea. 12.5 mg PO phenergan given prior to discharge.  Assessment and Plan  A: SIUP at 6738w5d Abdominal pain in pregnancy, first trimester Nausea and vomiting in pregnancy prior to [redacted] weeks gestation  P: Discharge home Rx for Phenergan given to patient First trimester precautions discussed Diet for N/V in  pregnancy provided Patient declined list of area OB providers  Patient advised to follow-up with a provider of choice to start prenatal care  Patient may return to MAU as needed or if her condition were to change or worsen  Vonzella NippleJulie Wenzel, PA-C 01/16/2018, 12:12 PM

## 2018-01-17 LAB — GC/CHLAMYDIA PROBE AMP (~~LOC~~) NOT AT ARMC
Chlamydia: NEGATIVE
NEISSERIA GONORRHEA: NEGATIVE

## 2018-01-17 LAB — HIV ANTIBODY (ROUTINE TESTING W REFLEX): HIV Screen 4th Generation wRfx: NONREACTIVE

## 2018-01-17 LAB — RPR: RPR: NONREACTIVE

## 2018-01-20 DIAGNOSIS — O210 Mild hyperemesis gravidarum: Secondary | ICD-10-CM | POA: Diagnosis not present

## 2018-01-21 DIAGNOSIS — O211 Hyperemesis gravidarum with metabolic disturbance: Secondary | ICD-10-CM | POA: Diagnosis not present

## 2018-01-21 DIAGNOSIS — E872 Acidosis: Secondary | ICD-10-CM | POA: Diagnosis not present

## 2018-01-21 DIAGNOSIS — E876 Hypokalemia: Secondary | ICD-10-CM | POA: Diagnosis not present

## 2018-01-21 DIAGNOSIS — Z3A01 Less than 8 weeks gestation of pregnancy: Secondary | ICD-10-CM | POA: Diagnosis not present

## 2018-01-22 DIAGNOSIS — O211 Hyperemesis gravidarum with metabolic disturbance: Secondary | ICD-10-CM | POA: Diagnosis not present

## 2018-01-22 DIAGNOSIS — Z3A01 Less than 8 weeks gestation of pregnancy: Secondary | ICD-10-CM | POA: Diagnosis not present

## 2018-01-23 DIAGNOSIS — Z3A01 Less than 8 weeks gestation of pregnancy: Secondary | ICD-10-CM | POA: Diagnosis not present

## 2018-01-23 DIAGNOSIS — R109 Unspecified abdominal pain: Secondary | ICD-10-CM | POA: Diagnosis not present

## 2018-01-23 DIAGNOSIS — O26891 Other specified pregnancy related conditions, first trimester: Secondary | ICD-10-CM | POA: Diagnosis not present

## 2018-02-13 DIAGNOSIS — Z3491 Encounter for supervision of normal pregnancy, unspecified, first trimester: Secondary | ICD-10-CM | POA: Diagnosis not present

## 2018-02-13 DIAGNOSIS — E876 Hypokalemia: Secondary | ICD-10-CM | POA: Diagnosis not present

## 2018-02-13 DIAGNOSIS — Z13220 Encounter for screening for lipoid disorders: Secondary | ICD-10-CM | POA: Diagnosis not present

## 2018-04-11 ENCOUNTER — Inpatient Hospital Stay (HOSPITAL_COMMUNITY)
Admission: AD | Admit: 2018-04-11 | Discharge: 2018-04-12 | Disposition: A | Discharge status: Home / Self Care | Payer: Medicaid Other | Source: Ambulatory Visit | Attending: Obstetrics & Gynecology | Admitting: Obstetrics & Gynecology

## 2018-04-11 ENCOUNTER — Encounter (HOSPITAL_COMMUNITY): Payer: Self-pay

## 2018-04-11 ENCOUNTER — Inpatient Hospital Stay (HOSPITAL_COMMUNITY): Payer: Medicaid Other

## 2018-04-11 DIAGNOSIS — O26891 Other specified pregnancy related conditions, first trimester: Secondary | ICD-10-CM | POA: Insufficient documentation

## 2018-04-11 DIAGNOSIS — Z3A01 Less than 8 weeks gestation of pregnancy: Secondary | ICD-10-CM | POA: Diagnosis not present

## 2018-04-11 DIAGNOSIS — O30041 Twin pregnancy, dichorionic/diamniotic, first trimester: Secondary | ICD-10-CM | POA: Diagnosis not present

## 2018-04-11 DIAGNOSIS — Z880 Allergy status to penicillin: Secondary | ICD-10-CM | POA: Insufficient documentation

## 2018-04-11 DIAGNOSIS — R1030 Lower abdominal pain, unspecified: Secondary | ICD-10-CM | POA: Diagnosis present

## 2018-04-11 DIAGNOSIS — R102 Pelvic and perineal pain: Secondary | ICD-10-CM | POA: Diagnosis not present

## 2018-04-11 DIAGNOSIS — O26899 Other specified pregnancy related conditions, unspecified trimester: Secondary | ICD-10-CM

## 2018-04-11 LAB — URINALYSIS, ROUTINE W REFLEX MICROSCOPIC
Bilirubin Urine: NEGATIVE
GLUCOSE, UA: NEGATIVE mg/dL
Hgb urine dipstick: NEGATIVE
KETONES UR: NEGATIVE mg/dL
Leukocytes, UA: NEGATIVE
Nitrite: NEGATIVE
PROTEIN: NEGATIVE mg/dL
Specific Gravity, Urine: 1.014 (ref 1.005–1.030)
pH: 7 (ref 5.0–8.0)

## 2018-04-11 LAB — CBC
HEMATOCRIT: 36.9 % (ref 36.0–46.0)
HEMOGLOBIN: 12.4 g/dL (ref 12.0–15.0)
MCH: 32.7 pg (ref 26.0–34.0)
MCHC: 33.6 g/dL (ref 30.0–36.0)
MCV: 97.4 fL (ref 80.0–100.0)
NRBC: 0 % (ref 0.0–0.2)
Platelets: 244 10*3/uL (ref 150–400)
RBC: 3.79 MIL/uL — ABNORMAL LOW (ref 3.87–5.11)
RDW: 12.7 % (ref 11.5–15.5)
WBC: 7.2 10*3/uL (ref 4.0–10.5)

## 2018-04-11 LAB — WET PREP, GENITAL
Clue Cells Wet Prep HPF POC: NONE SEEN
Sperm: NONE SEEN
TRICH WET PREP: NONE SEEN
Yeast Wet Prep HPF POC: NONE SEEN

## 2018-04-11 LAB — HCG, QUANTITATIVE, PREGNANCY: hCG, Beta Chain, Quant, S: 13736 m[IU]/mL — ABNORMAL HIGH (ref <5)

## 2018-04-11 LAB — POCT PREGNANCY, URINE: PREG TEST UR: POSITIVE — AB

## 2018-04-11 NOTE — MAU Note (Signed)
Pt unsure if she is pregnant. Pt reports lower abdominal cramping that started yesterday. States her stomach "feels heavy". States she was not able to go to work. Denies vaginal bleeding or discharge. LMP: sometime in October. Thinks she stopped bleeding on the 9th.

## 2018-04-12 ENCOUNTER — Encounter (HOSPITAL_COMMUNITY): Payer: Self-pay | Admitting: Advanced Practice Midwife

## 2018-04-12 DIAGNOSIS — O30041 Twin pregnancy, dichorionic/diamniotic, first trimester: Secondary | ICD-10-CM

## 2018-04-12 DIAGNOSIS — O26891 Other specified pregnancy related conditions, first trimester: Secondary | ICD-10-CM

## 2018-04-12 DIAGNOSIS — R102 Pelvic and perineal pain: Secondary | ICD-10-CM | POA: Diagnosis not present

## 2018-04-12 DIAGNOSIS — Z3A01 Less than 8 weeks gestation of pregnancy: Secondary | ICD-10-CM | POA: Diagnosis not present

## 2018-04-12 LAB — GC/CHLAMYDIA PROBE AMP (~~LOC~~) NOT AT ARMC
CHLAMYDIA, DNA PROBE: NEGATIVE
Neisseria Gonorrhea: NEGATIVE

## 2018-04-12 LAB — HIV ANTIBODY (ROUTINE TESTING W REFLEX): HIV Screen 4th Generation wRfx: NONREACTIVE

## 2018-04-12 NOTE — Discharge Instructions (Signed)
Abdominal Pain During Pregnancy Abdominal pain is common in pregnancy. Most of the time, it does not cause harm. There are many causes of abdominal pain. Some causes are more serious than others and sometimes the cause is not known. Abdominal pain can be a sign that something is very wrong with the pregnancy or the pain may have nothing to do with the pregnancy. Always tell your health care provider if you have any abdominal pain. Follow these instructions at home:  Do not have sex or put anything in your vagina until your symptoms go away completely.  Watch your abdominal pain for any changes.  Get plenty of rest until your pain improves.  Drink enough fluid to keep your urine clear or pale yellow.  Take over-the-counter or prescription medicines only as told by your health care provider.  Keep all follow-up visits as told by your health care provider. This is important. Contact a health care provider if:  You have a fever.  Your pain gets worse or you have cramping.  Your pain continues after resting. Get help right away if:  You are bleeding, leaking fluid, or passing tissue from the vagina.  You have vomiting or diarrhea that does not go away.  You have painful or bloody urination.  You notice a decrease in your baby's movements.  You feel very weak or faint.  You have shortness of breath.  You develop a severe headache with abdominal pain.  You have abnormal vaginal discharge with abdominal pain. This information is not intended to replace advice given to you by your health care provider. Make sure you discuss any questions you have with your health care provider. Document Released: 05/23/2005 Document Revised: 03/03/2016 Document Reviewed: 12/20/2012 Elsevier Interactive Patient Education  [employer]. Multiple Pregnancy Having a multiple pregnancy means that a woman is carrying more than one baby at a time. She may be pregnant with twins, triplets, or  more. The majority of multiple pregnancies are twins. Naturally conceiving triplets or more (higher-order multiples) is rare. Multiple pregnancies are riskier than single pregnancies. A woman with a multiple pregnancy is more likely to have certain problems during her pregnancy. Therefore, she will need to have more frequent appointments for prenatal care. How does a multiple pregnancy happen? A multiple pregnancy happens when:  The woman's body releases more than one egg at a time, and then each egg gets fertilized by a different sperm. ? This is the most common type of multiple pregnancy. ? Twins or other multiples produced this way are fraternal. They are no more alike than non-multiple siblings are.  One sperm fertilizes one egg, which then divides into more than one embryo. ? Twins or other multiples produced this way are identical. Identical multiples are always the same gender, and they look very much alike.  Who is most likely to have a multiple pregnancy? A multiple pregnancy is more likely to develop in women who:  Have had fertility treatment, especially if the treatment included fertility drugs.  Are older than 21 years of age.  Have already had four or more children.  Have a family history of multiple pregnancy.  How is a multiple pregnancy diagnosed? A multiple pregnancy may be diagnosed based on:  Symptoms such as: ? Rapid weight gain in the first 3 months of pregnancy (first trimester). ? More severe nausea and breast tenderness than what is typical of a single pregnancy. ? The uterus measuring larger than what is normal for the stage  of the pregnancy.  Blood tests that detect a higher-than-normal level of human chorionic gonadotropin (hCG). This is a hormone that your body produces in early pregnancy.  Ultrasound exam. This is used to confirm that you are carrying multiples.  What risks are associated with multiple pregnancy? A multiple pregnancy puts you at a  higher risk for certain problems during or after your pregnancy, including:  Having your babies delivered before you have reached a full-term pregnancy (preterm birth). A full-term pregnancy lasts for at least 37 weeks. Babies born before 37 weeks may have a higher risk of a variety of health problems, such as breathing problems, feeding difficulties, cerebral palsy, and learning disabilities.  Diabetes.  Preeclampsia. This is a serious condition that causes high blood pressure along with other symptoms, such as swelling and headaches, during pregnancy.  Excessive blood loss after childbirth (postpartum hemorrhage).  Postpartum depression.  Low birth weight of the babies.  How will having a multiple pregnancy affect my care? Your health care provider will want to monitor you more closely during your pregnancy to make sure that your babies are growing normally and that you are healthy. Follow these instructions at home: Because your pregnancy is considered to be high risk, you will need to work closely with your health care team. You may also need to make some lifestyle changes. These may include the following: Eating and drinking  Increase your nutrition. ? Follow your health care providers recommendations for weight gain. You may need to gain a little extra weight when you are pregnant with multiples. ? Eat healthy snacks often throughout the day. This can add calories and reduce nausea.  Drink enough fluid to keep your urine clear or pale yellow.  Take prenatal vitamins. Activity By 20-24 weeks, you may need to limit your activities.  Avoid activities and work that take a lot of effort (are strenuous).  Ask your health care provider when you should stop having sexual intercourse.  Rest often.  General instructions  Do not use any products that contain nicotine or tobacco, such as cigarettes and e-cigarettes. If you need help quitting, ask your health care provider.  Do not  drink alcohol or use illegal drugs.  Take over-the-counter and prescription medicines only as told by your health care provider.  Arrange for extra help around the house.  Keep all follow-up visits and all prenatal visits as told by your health care provider. This is important. Contact a health care provider if:  You have dizziness.  You have persistent nausea, vomiting, or diarrhea.  You are having trouble gaining weight.  You have feelings of depression or other emotions that are interfering with your normal activities. Get help right away if:  You have a fever.  You have pain with urination.  You have fluid leaking from your vagina.  You have a bad-smelling vaginal discharge.  You notice increased swelling in your face, hands, legs, or ankles.  You have spotting or bleeding from your vagina.  You have pelvic cramps, pelvic pressure, or nagging pain in your abdomen or lower back.  You are having regular contractions.  You develop a severe headache, with or without visual changes.  You have shortness of breath or chest pain.  You notice less fetal movement, or no fetal movement. This information is not intended to replace advice given to you by your health care provider. Make sure you discuss any questions you have with your health care provider. Document Released: 03/01/2008 Document Revised: 01/22/2016  Document Reviewed: 01/22/2016 Elsevier Interactive Patient Education  2018 [employer].   [place] [facility] Providers    [facility] [employer] at [facility]       Phone: [phone]  [facility] [employer] at [place] Phone: [phone]  [facility] [employer] at [place]  Phone: [phone]  Center for [facility] at [facility]  Phone: [phone]  Center for [facility] at [place]  Phone: [phone]  [facility]       Phone: [phone]  [facility]    Phone: [phone]   [facility])    Phone: [phone]  [facility]    Phone: [phone]  [facility]    Phone: [phone]  [facility]    Phone: [phone]  [facility]       Phone: [phone]   [facility]  Phone: [phone]  [facility]    Phone: [phone]  [facility]   Phone: [phone]  [facility]      Phone: [phone]  [facility]    Phone: [phone]

## 2018-04-12 NOTE — MAU Provider Note (Signed)
Chief Complaint: Abdominal Pain   First Provider Initiated Contact with Patient 04/11/18 2216        SUBJECTIVE HPI: Andrea Carson is a 22 y.o. [id number] at [redacted]w[redacted]d by LMP who presents to maternity admissions reporting lower abdominal cramping.  Had a medical elective abortion in August but did not start any contraception. . She denies vaginal bleeding, vaginal itching/burning, urinary symptoms, h/a, dizziness, n/v, or fever/chills.    States she plans to keep this pregnancy  Abdominal Pain  This is a new problem. The current episode started in the past 7 days. The onset quality is gradual. The problem occurs intermittently. The problem has been unchanged. The pain is mild. The quality of the pain is cramping. The abdominal pain does not radiate. Pertinent negatives include no constipation, diarrhea, dysuria, fever, frequency, headaches, myalgias, nausea or vomiting. Associated symptoms comments: Bloating . Nothing aggravates the pain. The pain is relieved by nothing. She has tried nothing for the symptoms.   RN Note: Pt unsure if she is pregnant. Pt reports lower abdominal cramping that started yesterday. States her stomach "feels heavy". States she was not able to go to work. Denies vaginal bleeding or discharge. LMP: sometime in October. Thinks she stopped bleeding on the 9th.   Past Medical History:  Diagnosis Date  . Constipation   . Eczema   . Eczema   . Environmental allergies   . Gall stones   . Infection    UTI  . Seasonal allergies    Past Surgical History:  Procedure Laterality Date  . ADENOIDECTOMY    . CHOLECYSTECTOMY N/A 07/31/2014   Procedure: LAPAROSCOPIC CHOLECYSTECTOMY WITH INTRAOPERATIVE CHOLANGIOGRAM;  Surgeon: Avel Peace, MD;  Location: [facility];  Service: General;  Laterality: N/A;  . THERAPEUTIC ABORTION     x3  . TONSILLECTOMY     Social History   Socioeconomic History  . Marital status: Single    Spouse name: Not on file  . Number of children: Not on  file  . Years of education: Not on file  . Highest education level: Not on file  Occupational History  . Not on file  Social Needs  . Financial resource strain: Not on file  . Food insecurity:    Worry: Not on file    Inability: Not on file  . Transportation needs:    Medical: Not on file    Non-medical: Not on file  Tobacco Use  . Smoking status: Never Smoker  . Smokeless tobacco: Never Used  Substance and Sexual Activity  . Alcohol use: No  . Drug use: Not Currently    Frequency: 7.0 times per week    Types: Marijuana  . Sexual activity: Yes    Birth control/protection: None  Lifestyle  . Physical activity:    Days per week: Not on file    Minutes per session: Not on file  . Stress: Not on file  Relationships  . Social connections:    Talks on phone: Not on file    Gets together: Not on file    Attends religious service: Not on file    Active member of club or organization: Not on file    Attends meetings of clubs or organizations: Not on file    Relationship status: Not on file  . Intimate partner violence:    Fear of current or ex partner: Not on file    Emotionally abused: Not on file    Physically abused: Not on file    Forced sexual  activity: Not on file  Other Topics Concern  . Not on file  Social History Narrative  . Not on file   No current facility-administered medications on file prior to encounter.    Current Outpatient Medications on File Prior to Encounter  Medication Sig Dispense Refill  . Doxylamine-Pyridoxine 10-10 MG TBEC Two tablets at bedtime on day 1 and 2; if symptoms persist, take 1 tablet in morning and 2 tablets at bedtime on day 3. 60 tablet 1  . promethazine (PHENERGAN) 12.5 MG tablet Take 1 tablet (12.5 mg total) by mouth every 6 (six) hours as needed for nausea or vomiting. 30 tablet 0   Allergies  Allergen Reactions  . Bee Venom Anaphylaxis and Other (See Comments)    Pt has an epipen  . Fire [employer] Anaphylaxis  . Amoxicillin Rash     CHILDHOOD ALLERGY Has patient had a PCN reaction causing immediate rash, facial/tongue/throat swelling, SOB or lightheadedness with hypotension: Yes Has patient had a PCN reaction causing severe rash involving mucus membranes or skin necrosis: No Has patient had a PCN reaction that required hospitalization No Has patient had a PCN reaction occurring within the last 10 years: No  If all of the above answers are "NO", then may proceed with Cephalosporin use.     I have reviewed patient's Past Medical Hx, Surgical Hx, Family Hx, Social Hx, medications and allergies.   ROS:  Review of Systems  Constitutional: Negative for fever.  Gastrointestinal: Positive for abdominal pain. Negative for constipation, diarrhea, nausea and vomiting.  Genitourinary: Negative for dysuria and frequency.  Musculoskeletal: Negative for myalgias.  Neurological: Negative for headaches.   Review of Systems  Other systems negative   Physical Exam  Physical Exam Patient Vitals for the past 24 hrs:  BP Temp Temp src Pulse Resp SpO2 Height Weight  04/11/18 2147 112/74 98.4 F (36.9 C) Oral 85 17 98 % 5' (1.524 m) 65.8 kg   Constitutional: Well-developed, well-nourished female in no acute distress.  Cardiovascular: normal rate Respiratory: normal effort GI: Abd soft, non-tender. Pos BS x 4 MS: Extremities nontender, no edema, normal ROM Neurologic: Alert and oriented x 4.  GU: Neg CVAT.  PELVIC EXAM: Cervix pink, visually closed, without lesion, scant white creamy discharge, vaginal walls and external genitalia normal Bimanual exam: Cervix 0/long/high, firm, anterior, neg CMT, uterus nontender, 6-7wk size enlarged, adnexa without tenderness, enlargement, or mass   LAB RESULTS Results for orders placed or performed during the hospital encounter of 04/11/18 (from the past 24 hour(s))  Pregnancy, urine POC     Status: Abnormal   Collection Time: 04/11/18 10:02 PM  Result Value Ref Range   Preg Test, Ur  POSITIVE (A) NEGATIVE  Urinalysis, Routine w reflex microscopic     Status: Abnormal   Collection Time: 04/11/18 10:04 PM  Result Value Ref Range   Color, Urine YELLOW YELLOW   APPearance CLEAR CLEAR   Specific Gravity, Urine 1.014 1.005 - 1.030   pH 7.0 5.0 - 8.0   Glucose, UA NEGATIVE NEGATIVE mg/dL   Hgb urine dipstick NEGATIVE NEGATIVE   Bilirubin Urine NEGATIVE NEGATIVE   Ketones, ur NEGATIVE NEGATIVE mg/dL   Protein, ur NEGATIVE NEGATIVE mg/dL   Nitrite NEGATIVE NEGATIVE   Leukocytes, UA NEGATIVE NEGATIVE   RBC / HPF 0-5 0 - 5 RBC/hpf   WBC, UA 0-5 0 - 5 WBC/hpf   Bacteria, UA RARE (A) NONE SEEN   Squamous Epithelial / LPF 0-5 0 - 5   Mucus  PRESENT   Wet prep, genital     Status: Abnormal   Collection Time: 04/11/18 10:29 PM  Result Value Ref Range   Yeast Wet Prep HPF POC NONE SEEN NONE SEEN   Trich, Wet Prep NONE SEEN NONE SEEN   Clue Cells Wet Prep HPF POC NONE SEEN NONE SEEN   WBC, Wet Prep HPF POC FEW (A) NONE SEEN   Sperm NONE SEEN   CBC     Status: Abnormal   Collection Time: 04/11/18 10:39 PM  Result Value Ref Range   WBC 7.2 4.0 - 10.5 K/uL   RBC 3.79 (L) 3.87 - 5.11 MIL/uL   Hemoglobin 12.4 12.0 - 15.0 g/dL   HCT 16.1 09.6 - 04.5 %   MCV 97.4 80.0 - 100.0 fL   MCH 32.7 26.0 - 34.0 pg   MCHC 33.6 30.0 - 36.0 g/dL   RDW 40.9 81.1 - 91.4 %   Platelets 244 150 - 400 K/uL   nRBC 0.0 0.0 - 0.2 %  hCG, quantitative, pregnancy     Status: Abnormal   Collection Time: 04/11/18 10:39 PM  Result Value Ref Range   hCG, Beta Chain, Quant, S 13,736 (H) <5 mIU/mL       IMAGING US Ob Comp Less 14 Wks  Result Date: 04/11/2018 CLINICAL DATA:  Pain. Uncertain LMP. Quantitative beta HCG is in progress. EXAM: TWIN OBSTETRIC <14WK [place] AND TRANSVAGINAL OB US COMPARISON:  01/16/2018 FINDINGS: Number of IUPs:  2 Chorionicity/Amnionicity:  Dichorionic diamniotic TWIN 1, located anteriorly and labeled twin A on the images. Yolk sac:  Present Embryo:  A tiny embryo is likely  present. Cardiac Activity: Not identified. MSD: 4 mm   5 w   0 d TWIN 2, located posteriorly and labeled twin B on the images. Yolk sac:  Present Embryo:  Not definitely seen. Cardiac Activity: Not identified. MSD: 8.3 mm   5 w   4 d Subchorionic hemorrhage:  None visualized. Maternal uterus/adnexae: Uterus is anteverted. No myometrial mass lesions identified. Both ovaries are visualized and appear normal. No abnormal adnexal mass lesions. Minimal free fluid in the pelvis. IMPRESSION: Twin intrauterine pregnancy. Dichorionic diamniotic twin pregnancy. Yolk sacs are visualized within both gestational sacs with possible embryo seen for twin 1. No definite fetal pole for twin 2. No cardiac activity yet visualized. Recommend follow-up quantitative B-HCG levels and follow-up US in 14 days to assess viability. This recommendation follows SRU consensus guidelines: Diagnostic Criteria for Nonviable Pregnancy Early in the First Trimester. Malva Limes Med 2013; 782:9562-13. Electronically Signed   By: Burman Nieves M.D.   On: 04/11/2018 23:48   US Ob Comp Addl Gest Less 14 Wks  Result Date: 04/11/2018 CLINICAL DATA:  Pain. Uncertain LMP. Quantitative beta HCG is in progress. EXAM: TWIN OBSTETRIC <14WK [place] AND TRANSVAGINAL OB US COMPARISON:  01/16/2018 FINDINGS: Number of IUPs:  2 Chorionicity/Amnionicity:  Dichorionic diamniotic TWIN 1, located anteriorly and labeled twin A on the images. Yolk sac:  Present Embryo:  A tiny embryo is likely present. Cardiac Activity: Not identified. MSD: 4 mm   5 w   0 d TWIN 2, located posteriorly and labeled twin B on the images. Yolk sac:  Present Embryo:  Not definitely seen. Cardiac Activity: Not identified. MSD: 8.3 mm   5 w   4 d Subchorionic hemorrhage:  None visualized. Maternal uterus/adnexae: Uterus is anteverted. No myometrial mass lesions identified. Both ovaries are visualized and appear normal. No abnormal adnexal mass lesions. Minimal free  fluid in the pelvis. IMPRESSION:  Twin intrauterine pregnancy. Dichorionic diamniotic twin pregnancy. Yolk sacs are visualized within both gestational sacs with possible embryo seen for twin 1. No definite fetal pole for twin 2. No cardiac activity yet visualized. Recommend follow-up quantitative B-HCG levels and follow-up US in 14 days to assess viability. This recommendation follows SRU consensus guidelines: Diagnostic Criteria for Nonviable Pregnancy Early in the First Trimester. Malva Limes Med 2013; 161:0960-45. Electronically Signed   By: Burman Nieves M.D.   On: 04/11/2018 23:48   US Ob Transvaginal  Result Date: 04/11/2018 CLINICAL DATA:  Pain. Uncertain LMP. Quantitative beta HCG is in progress. EXAM: TWIN OBSTETRIC <14WK [place] AND TRANSVAGINAL OB US COMPARISON:  01/16/2018 FINDINGS: Number of IUPs:  2 Chorionicity/Amnionicity:  Dichorionic diamniotic TWIN 1, located anteriorly and labeled twin A on the images. Yolk sac:  Present Embryo:  A tiny embryo is likely present. Cardiac Activity: Not identified. MSD: 4 mm   5 w   0 d TWIN 2, located posteriorly and labeled twin B on the images. Yolk sac:  Present Embryo:  Not definitely seen. Cardiac Activity: Not identified. MSD: 8.3 mm   5 w   4 d Subchorionic hemorrhage:  None visualized. Maternal uterus/adnexae: Uterus is anteverted. No myometrial mass lesions identified. Both ovaries are visualized and appear normal. No abnormal adnexal mass lesions. Minimal free fluid in the pelvis. IMPRESSION: Twin intrauterine pregnancy. Dichorionic diamniotic twin pregnancy. Yolk sacs are visualized within both gestational sacs with possible embryo seen for twin 1. No definite fetal pole for twin 2. No cardiac activity yet visualized. Recommend follow-up quantitative B-HCG levels and follow-up US in 14 days to assess viability. This recommendation follows SRU consensus guidelines: Diagnostic Criteria for Nonviable Pregnancy Early in the First Trimester. Malva Limes Med 2013; 409:8119-14. Electronically  Signed   By: Burman Nieves M.D.   On: 04/11/2018 23:48    MAU Management/MDM: Ordered usual first trimester r/o ectopic labs.   Pelvic exam and cultures done Will check baseline Ultrasound to rule out ectopic.  This bleeding/pain can represent a normal pregnancy with bleeding, spontaneous abortion or even an ectopic which can be life-threatening.  The process as listed above helps to determine which of these is present.  Informed of results. She is excited about twins.  Discussed the yolk sacs help rule out the ectopic pregnancy but will need viability scan in about 2 weeks.    ASSESSMENT 1. Pelvic pain affecting pregnancy   2. Dichorionic diamniotic twin pregnancy in first trimester     PLAN Discharge home List of OB providers given Letter for proof of pregnancy given   Follow-up Information    [facility] for [facility] Follow up.   Specialty:  Obstetrics and Gynecology Contact information: [address] [place] [address] [phone]       Doctor of choice. Schedule an appointment as soon as possible for a visit.          Pt stable at time of discharge. Encouraged to return here or to other Urgent Care/ED if she develops worsening of symptoms, increase in pain, fever, or other concerning symptoms.    Wynelle Bourgeois CNM, MSN Certified Nurse-Midwife 04/12/2018  12:16 AM

## 2018-04-15 DIAGNOSIS — Z3A01 Less than 8 weeks gestation of pregnancy: Secondary | ICD-10-CM | POA: Diagnosis not present

## 2018-04-15 DIAGNOSIS — O210 Mild hyperemesis gravidarum: Secondary | ICD-10-CM | POA: Diagnosis not present

## 2018-04-15 DIAGNOSIS — O209 Hemorrhage in early pregnancy, unspecified: Secondary | ICD-10-CM | POA: Diagnosis not present

## 2018-04-15 DIAGNOSIS — O30001 Twin pregnancy, unspecified number of placenta and unspecified number of amniotic sacs, first trimester: Secondary | ICD-10-CM | POA: Diagnosis not present

## 2018-04-17 DIAGNOSIS — E872 Acidosis: Secondary | ICD-10-CM | POA: Diagnosis not present

## 2018-04-17 DIAGNOSIS — E876 Hypokalemia: Secondary | ICD-10-CM | POA: Diagnosis not present

## 2018-04-17 DIAGNOSIS — O211 Hyperemesis gravidarum with metabolic disturbance: Secondary | ICD-10-CM | POA: Diagnosis not present

## 2018-04-17 DIAGNOSIS — T730XXA Starvation, initial encounter: Secondary | ICD-10-CM | POA: Diagnosis not present

## 2018-04-18 DIAGNOSIS — O210 Mild hyperemesis gravidarum: Secondary | ICD-10-CM | POA: Diagnosis not present

## 2018-04-18 DIAGNOSIS — Z3A01 Less than 8 weeks gestation of pregnancy: Secondary | ICD-10-CM | POA: Diagnosis not present

## 2018-04-19 DIAGNOSIS — O210 Mild hyperemesis gravidarum: Secondary | ICD-10-CM | POA: Diagnosis not present

## 2018-04-19 DIAGNOSIS — Z3A01 Less than 8 weeks gestation of pregnancy: Secondary | ICD-10-CM | POA: Diagnosis not present

## 2018-07-06 DIAGNOSIS — Z6827 Body mass index (BMI) 27.0-27.9, adult: Secondary | ICD-10-CM | POA: Diagnosis not present

## 2018-07-06 DIAGNOSIS — Z3200 Encounter for pregnancy test, result unknown: Secondary | ICD-10-CM | POA: Diagnosis not present

## 2018-07-06 DIAGNOSIS — Z113 Encounter for screening for infections with a predominantly sexual mode of transmission: Secondary | ICD-10-CM | POA: Diagnosis not present

## 2018-07-06 DIAGNOSIS — F526 Dyspareunia not due to a substance or known physiological condition: Secondary | ICD-10-CM | POA: Diagnosis not present

## 2018-07-06 DIAGNOSIS — Z30017 Encounter for initial prescription of implantable subdermal contraceptive: Secondary | ICD-10-CM | POA: Diagnosis not present

## 2018-07-12 DIAGNOSIS — Z6827 Body mass index (BMI) 27.0-27.9, adult: Secondary | ICD-10-CM | POA: Diagnosis not present

## 2018-07-12 DIAGNOSIS — Z30015 Encounter for initial prescription of vaginal ring hormonal contraceptive: Secondary | ICD-10-CM | POA: Diagnosis not present

## 2018-07-12 DIAGNOSIS — B379 Candidiasis, unspecified: Secondary | ICD-10-CM | POA: Diagnosis not present

## 2018-07-12 DIAGNOSIS — Z124 Encounter for screening for malignant neoplasm of cervix: Secondary | ICD-10-CM | POA: Diagnosis not present

## 2018-07-12 DIAGNOSIS — T3695XA Adverse effect of unspecified systemic antibiotic, initial encounter: Secondary | ICD-10-CM | POA: Diagnosis not present

## 2018-07-12 DIAGNOSIS — Z3200 Encounter for pregnancy test, result unknown: Secondary | ICD-10-CM | POA: Diagnosis not present

## 2018-12-06 ENCOUNTER — Other Ambulatory Visit: Payer: Self-pay

## 2018-12-06 ENCOUNTER — Emergency Department (HOSPITAL_BASED_OUTPATIENT_CLINIC_OR_DEPARTMENT_OTHER)
Admission: EM | Admit: 2018-12-06 | Discharge: 2018-12-06 | Disposition: A | Discharge status: Home / Self Care | Payer: Medicaid Other | Attending: Emergency Medicine | Admitting: Emergency Medicine

## 2018-12-06 ENCOUNTER — Encounter (HOSPITAL_BASED_OUTPATIENT_CLINIC_OR_DEPARTMENT_OTHER): Payer: Self-pay

## 2018-12-06 DIAGNOSIS — Y999 Unspecified external cause status: Secondary | ICD-10-CM | POA: Insufficient documentation

## 2018-12-06 DIAGNOSIS — B9689 Other specified bacterial agents as the cause of diseases classified elsewhere: Secondary | ICD-10-CM

## 2018-12-06 DIAGNOSIS — N760 Acute vaginitis: Secondary | ICD-10-CM | POA: Diagnosis not present

## 2018-12-06 DIAGNOSIS — S0990XA Unspecified injury of head, initial encounter: Secondary | ICD-10-CM | POA: Diagnosis present

## 2018-12-06 DIAGNOSIS — Y9389 Activity, other specified: Secondary | ICD-10-CM | POA: Diagnosis not present

## 2018-12-06 DIAGNOSIS — Y929 Unspecified place or not applicable: Secondary | ICD-10-CM | POA: Insufficient documentation

## 2018-12-06 DIAGNOSIS — S060X0A Concussion without loss of consciousness, initial encounter: Secondary | ICD-10-CM | POA: Insufficient documentation

## 2018-12-06 LAB — URINALYSIS, ROUTINE W REFLEX MICROSCOPIC
Bilirubin Urine: NEGATIVE
Glucose, UA: NEGATIVE mg/dL
Ketones, ur: NEGATIVE mg/dL
Leukocytes,Ua: NEGATIVE
Nitrite: NEGATIVE
Protein, ur: NEGATIVE mg/dL
Specific Gravity, Urine: 1.025 (ref 1.005–1.030)
pH: 7.5 (ref 5.0–8.0)

## 2018-12-06 LAB — PREGNANCY, URINE: Preg Test, Ur: NEGATIVE

## 2018-12-06 LAB — URINALYSIS, MICROSCOPIC (REFLEX): Squamous Epithelial / LPF: >50 (ref 0–5)

## 2018-12-06 LAB — WET PREP, GENITAL
Sperm: NONE SEEN
Trich, Wet Prep: NONE SEEN
Yeast Wet Prep HPF POC: NONE SEEN

## 2018-12-06 MED ORDER — FLUCONAZOLE 150 MG PO TABS: 150.0000 mg | ORAL_TABLET | ORAL | 0 refills | Status: DC | PRN

## 2018-12-06 MED ORDER — METRONIDAZOLE 500 MG PO TABS: 500.0000 mg | ORAL_TABLET | Freq: Two times a day (BID) | ORAL | 0 refills | Status: DC

## 2018-12-06 NOTE — Discharge Instructions (Signed)
If you develop continued, recurrent, or worsening headache, fever, neck stiffness, vomiting, blurry or double vision, weakness or numbness in your arms or legs, trouble speaking, or any other new/concerning symptoms then return to the ER for evaluation.  
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
## 2018-12-08 LAB — RPR: RPR Ser Ql: NONREACTIVE

## 2018-12-08 LAB — HIV ANTIBODY (ROUTINE TESTING W REFLEX): HIV Screen 4th Generation wRfx: NONREACTIVE

## 2018-12-11 LAB — GC/CHLAMYDIA PROBE AMP (~~LOC~~) NOT AT ARMC
Chlamydia: NEGATIVE
Neisseria Gonorrhea: NEGATIVE

## 2018-12-20 ENCOUNTER — Ambulatory Visit (HOSPITAL_COMMUNITY)
Admission: EM | Admit: 2018-12-20 | Discharge: 2018-12-20 | Disposition: A | Discharge status: Home / Self Care | Payer: Medicaid Other | Attending: Family Medicine | Admitting: Family Medicine

## 2018-12-20 ENCOUNTER — Other Ambulatory Visit: Payer: Self-pay

## 2018-12-20 ENCOUNTER — Encounter (HOSPITAL_COMMUNITY): Payer: Self-pay | Admitting: Emergency Medicine

## 2018-12-20 DIAGNOSIS — N898 Other specified noninflammatory disorders of vagina: Secondary | ICD-10-CM | POA: Diagnosis not present

## 2018-12-20 MED ORDER — FLUCONAZOLE 150 MG PO TABS: 150.0000 mg | ORAL_TABLET | ORAL | 0 refills | Status: DC | PRN

## 2018-12-20 NOTE — ED Triage Notes (Signed)
Pt here for vaginal discharge worse after starting BV and yeast meds; pt here for recheck

## 2018-12-20 NOTE — Discharge Instructions (Addendum)
Follow up as needed for continued or worsening symptoms

## 2018-12-23 NOTE — ED Provider Notes (Addendum)
[facility]    CSN: [id number] Arrival date & time: 12/20/18  1426      History   Chief Complaint Chief Complaint  Patient presents with   Vaginal Discharge    HPI Andrea Carson is a 23 y.o. female.   Pt is a 23 year old female that  Presents with vaginal discharge. She was seen in the ER 2 weeks ago and treated for BV and yeast infectiont. Reporting increasing discharge. Other symptoms have improved. No associated abdominal pain, back pain, fever. No dysuria, hematuria, urinary frequency. Mild itching. She has taken Flagyl and Diflucan.   ROS per HPI      Past Medical History:  Diagnosis Date   Constipation    Eczema    Eczema    Environmental allergies    Gall stones    Infection    UTI   Seasonal allergies     Patient Active Problem List   Diagnosis Date Noted   Cholelithiasis with acute cholecystitis 07/30/2014   Indication for care in labor and delivery, antepartum 05/31/2014   NSVD (normal spontaneous vaginal delivery) 05/31/2014    Past Surgical History:  Procedure Laterality Date   ADENOIDECTOMY     CHOLECYSTECTOMY N/A 07/31/2014   Procedure: LAPAROSCOPIC CHOLECYSTECTOMY WITH INTRAOPERATIVE CHOLANGIOGRAM;  Surgeon: Avel Peaceodd Rosenbower, MD;  Location: [facility];  Service: General;  Laterality: N/A;   THERAPEUTIC ABORTION     x3   TONSILLECTOMY      OB History    Gravida  6   Para  1   Term  1   Preterm  0   AB  4   Living  1     SAB  0   TAB  4   Ectopic  0   Multiple  0   Live Births  1            Home Medications    Prior to Admission medications   Medication Sig Start Date End Date Taking? Authorizing Provider  Doxylamine-Pyridoxine 10-10 MG TBEC Two tablets at bedtime on day 1 and 2; if symptoms persist, take 1 tablet in morning and 2 tablets at bedtime on day 3. 01/16/18   Marny LowensteinWenzel, Julie N, PA-C  fluconazole (DIFLUCAN) 150 MG tablet Take 1 tablet (150 mg total) by mouth every 3 (three) days as needed.  12/20/18   Dahlia ByesBast, Nanna Ertle A, NP  metroNIDAZOLE (FLAGYL) 500 MG tablet Take 1 tablet (500 mg total) by mouth 2 (two) times daily. One po bid x 7 days 12/06/18   Pricilla LovelessGoldston, Scott, MD  promethazine (PHENERGAN) 12.5 MG tablet Take 1 tablet (12.5 mg total) by mouth every 6 (six) hours as needed for nausea or vomiting. 01/16/18   Marny LowensteinWenzel, Julie N, PA-C    Family History Family History  Problem Relation Age of Onset   Hypertension Maternal Grandmother    Cancer Maternal Grandmother    Hypertension Mother    Cancer Maternal Aunt    Anxiety disorder Maternal Uncle    Diabetes Paternal Grandmother     Social History Social History   Tobacco Use   Smoking status: Never Smoker   Smokeless tobacco: Never Used  Substance Use Topics   Alcohol use: No   Drug use: Yes    Types: Marijuana     Allergies   Bee venom, Fire ant, and Amoxicillin   Review of Systems Review of Systems   Physical Exam Triage Vital Signs ED Triage Vitals  Enc Vitals Group  BP 12/20/18 1458 120/60     Pulse Rate 12/20/18 1458 96     Resp 12/20/18 1458 18     Temp 12/20/18 1458 98.1 F (36.7 C)     Temp Source 12/20/18 1458 Oral     SpO2 12/20/18 1458 99 %     Weight --      Height --      Head Circumference --      Peak Flow --      Pain Score 12/20/18 1459 4     Pain Loc --      Pain Edu? --      Excl. in [place]? --    No data found.  Updated Vital Signs BP 120/60 (BP Location: Right Arm)    Pulse 96    Temp 98.1 F (36.7 C) (Oral)    Resp 18    LMP 11/30/2018    SpO2 99%   Visual Acuity Right Eye Distance:   Left Eye Distance:   Bilateral Distance:    Right Eye Near:   Left Eye Near:    Bilateral Near:     Physical Exam Vitals signs and nursing note reviewed.  Constitutional:      General: She is not in acute distress.    Appearance: Normal appearance. She is not ill-appearing, toxic-appearing or diaphoretic.  HENT:     Head: Normocephalic.     Nose: Nose normal.      Mouth/Throat:     Pharynx: Oropharynx is clear.  Eyes:     Conjunctiva/sclera: Conjunctivae normal.  Neck:     Musculoskeletal: Normal range of motion.  Pulmonary:     Effort: Pulmonary effort is normal.  Abdominal:     Palpations: Abdomen is soft.     Tenderness: There is no abdominal tenderness.  Musculoskeletal: Normal range of motion.  Skin:    General: Skin is warm and dry.     Findings: No rash.  Neurological:     Mental Status: She is alert.  Psychiatric:        Mood and Affect: Mood normal.      UC Treatments / Results  Labs (all labs ordered are listed, but only abnormal results are displayed) Labs Reviewed - No data to display  EKG   Radiology No results found.  Procedures Procedures (including critical care time)  Medications Ordered in UC Medications - No data to display  Initial Impression / Assessment and Plan / UC Course  I have reviewed the triage vital signs and the nursing notes.  Pertinent labs & imaging results that were available during my care of the patient were reviewed by me and considered in my medical decision making (see chart for details).     Retreating for yeast infection.  Has already completed BV meds.  Follow up as needed for continued or worsening symptoms  Final Clinical Impressions(s) / UC Diagnoses   Final diagnoses:  Vaginal discharge     Discharge Instructions     Follow up as needed for continued or worsening symptoms    ED Prescriptions    Medication Sig Dispense Auth. Provider   fluconazole (DIFLUCAN) 150 MG tablet Take 1 tablet (150 mg total) by mouth every 3 (three) days as needed. 2 tablet Orvan July, NP     Controlled Substance Prescriptions [place] Controlled Substance Registry consulted? no   Orvan July, NP 12/23/18 1857    Orvan July, NP 12/23/18 1858

## 2019-01-02 ENCOUNTER — Ambulatory Visit (HOSPITAL_COMMUNITY)
Admission: EM | Admit: 2019-01-02 | Discharge: 2019-01-02 | Disposition: A | Discharge status: Home / Self Care | Payer: Medicaid Other | Attending: Internal Medicine | Admitting: Internal Medicine

## 2019-01-02 ENCOUNTER — Encounter (HOSPITAL_COMMUNITY): Payer: Self-pay

## 2019-01-02 ENCOUNTER — Other Ambulatory Visit: Payer: Self-pay

## 2019-01-02 DIAGNOSIS — J029 Acute pharyngitis, unspecified: Secondary | ICD-10-CM

## 2019-01-02 LAB — POCT RAPID STREP A: Streptococcus, Group A Screen (Direct): NEGATIVE

## 2019-01-02 MED ORDER — CETIRIZINE HCL 10 MG PO CAPS: 10.0000 mg | ORAL_CAPSULE | Freq: Every day | ORAL | 0 refills | Status: DC

## 2019-01-02 NOTE — Discharge Instructions (Signed)
Sore Throat  Your rapid strep tested Negative today. Most likely viral or related to drainage  Please continue Tylenol or Ibuprofen for fever and pain. May try salt water gargles, cepacol lozenges, throat spray, or OTC cold relief medicine for throat discomfort. If you also have congestion take a daily anti-histamine like Zyrtec, Claritin, and a oral decongestant to help with post nasal drip that may be irritating your throat.   Stay hydrated and drink plenty of fluids to keep your throat coated relieve irritation.

## 2019-01-02 NOTE — ED Triage Notes (Addendum)
Pt states she has a sore throat. Pt states she got a new vape a few days ago not sure if that's where is coming from or not. Pt states she dose not want to get tested for Covid.

## 2019-01-03 NOTE — ED Provider Notes (Signed)
[name]    CSN: [id number] Arrival date & time: 01/02/19  1535      History   Chief Complaint Chief Complaint  Patient presents with   Sore Throat    HPI Andrea Carson is a 23 y.o. female history of allergic rhinitis, previous tonsillectomy presenting today for evaluation of a sore throat.  Patient states that she has had a sore throat over the past few days.  She denies any other symptoms of congestion, rhinorrhea, cough.  Denies any fevers chills or body aches.  Denies known exposure to COVID.  She is concerned that it may be related to a new vape she is using.  She vapes nicotine.  Denies neck stiffness or neck pain.  HPI  Past Medical History:  Diagnosis Date   Constipation    Eczema    Eczema    Environmental allergies    Gall stones    Infection    UTI   Seasonal allergies     Patient Active Problem List   Diagnosis Date Noted   Cholelithiasis with acute cholecystitis 07/30/2014   Indication for care in labor and delivery, antepartum 05/31/2014   NSVD (normal spontaneous vaginal delivery) 05/31/2014    Past Surgical History:  Procedure Laterality Date   ADENOIDECTOMY     CHOLECYSTECTOMY N/A 07/31/2014   Procedure: LAPAROSCOPIC CHOLECYSTECTOMY WITH INTRAOPERATIVE CHOLANGIOGRAM;  Surgeon: [name], MD;  Location: [facility];  Service: General;  Laterality: N/A;   THERAPEUTIC ABORTION     x3   TONSILLECTOMY      OB History    Gravida  6   Para  1   Term  1   Preterm  0   AB  4   Living  1     SAB  0   TAB  4   Ectopic  0   Multiple  0   Live Births  1            Home Medications    Prior to Admission medications   Medication Sig Start Date End Date Taking? Authorizing Provider  Cetirizine HCl 10 MG CAPS Take 1 capsule (10 mg total) by mouth daily. 01/02/19   Ashyia Schraeder C, PA-C  Doxylamine-Pyridoxine 10-10 MG TBEC Two tablets at bedtime on day 1 and 2; if symptoms persist, take 1 tablet in  morning and 2 tablets at bedtime on day 3. 01/16/18   Luvenia Redden, PA-C  fluconazole (DIFLUCAN) 150 MG tablet Take 1 tablet (150 mg total) by mouth every 3 (three) days as needed. 12/20/18   Loura Halt A, NP  metroNIDAZOLE (FLAGYL) 500 MG tablet Take 1 tablet (500 mg total) by mouth 2 (two) times daily. One po bid x 7 days 12/06/18   Sherwood Gambler, MD  promethazine (PHENERGAN) 12.5 MG tablet Take 1 tablet (12.5 mg total) by mouth every 6 (six) hours as needed for nausea or vomiting. 01/16/18   Luvenia Redden, PA-C    Family History Family History  Problem Relation Age of Onset   Hypertension Maternal Grandmother    Cancer Maternal Grandmother    Hypertension Mother    Cancer Maternal Aunt    Anxiety disorder Maternal Uncle    Diabetes Paternal Grandmother     Social History Social History   Tobacco Use   Smoking status: Never Smoker   Smokeless tobacco: Never Used  Substance Use Topics   Alcohol use: No   Drug use: Yes    Types: Marijuana  Allergies   Bee venom, Fire ant, and Amoxicillin   Review of Systems Review of Systems  Constitutional: Negative for activity change, appetite change, chills, fatigue and fever.  HENT: Positive for sore throat. Negative for congestion, ear pain, rhinorrhea, sinus pressure and trouble swallowing.   Eyes: Negative for discharge and redness.  Respiratory: Negative for cough, chest tightness and shortness of breath.   Cardiovascular: Negative for chest pain.  Gastrointestinal: Negative for abdominal pain, diarrhea, nausea and vomiting.  Musculoskeletal: Negative for myalgias.  Skin: Negative for rash.  Neurological: Negative for dizziness, light-headedness and headaches.     Physical Exam Triage Vital Signs ED Triage Vitals  Enc Vitals Group     BP 01/02/19 1617 (!) 123/57     Pulse Rate 01/02/19 1617 73     Resp 01/02/19 1617 16     Temp 01/02/19 1617 99.5 F (37.5 C)     Temp Source 01/02/19 1617 Oral     SpO2  01/02/19 1617 100 %     Weight 01/02/19 1615 140 lb (63.5 kg)     Height --      Head Circumference --      Peak Flow --      Pain Score 01/02/19 1615 6     Pain Loc --      Pain Edu? --      Excl. in [place]? --    No data found.  Updated Vital Signs BP (!) 123/57 (BP Location: Right Arm)    Pulse 73    Temp 99.5 F (37.5 C) (Oral)    Resp 16    Wt 140 lb (63.5 kg)    LMP 01/02/2019    SpO2 100%    BMI 27.34 kg/m   Visual Acuity Right Eye Distance:   Left Eye Distance:   Bilateral Distance:    Right Eye Near:   Left Eye Near:    Bilateral Near:     Physical Exam Vitals signs and nursing note reviewed.  Constitutional:      General: She is not in acute distress.    Appearance: She is well-developed.  HENT:     Head: Normocephalic and atraumatic.     Ears:     Comments: Bilateral ears without tenderness to palpation of external auricle, tragus and mastoid, EAC's without erythema or swelling, TM's with good bony landmarks and cone of light. Non erythematous.    Mouth/Throat:     Comments: Oral mucosa pink and moist, no tonsillar enlargement or exudate. Posterior pharynx patent and nonerythematous, no uvula deviation or swelling.  Mild cobblestoning posteriorly, normal phonation. Eyes:     Conjunctiva/sclera: Conjunctivae normal.  Neck:     Musculoskeletal: Neck supple.     Comments: No lymphadenopathy, full active range of motion of neck Cardiovascular:     Rate and Rhythm: Normal rate and regular rhythm.     Heart sounds: No murmur.  Pulmonary:     Effort: Pulmonary effort is normal. No respiratory distress.     Breath sounds: Normal breath sounds.     Comments: Breathing comfortably at rest, CTABL, no wheezing, rales or other adventitious sounds auscultated Abdominal:     Palpations: Abdomen is soft.     Tenderness: There is no abdominal tenderness.  Skin:    General: Skin is warm and dry.  Neurological:     Mental Status: She is alert.      UC Treatments /  Results  Labs (all labs ordered are listed, but only  abnormal results are displayed) Labs Reviewed  CULTURE, GROUP A STREP [facility]  POCT RAPID STREP A    EKG   Radiology No results found.  Procedures Procedures (including critical care time)  Medications Ordered in UC Medications - No data to display  Initial Impression / Assessment and Plan / UC Course  I have reviewed the triage vital signs and the nursing notes.  Pertinent labs & imaging results that were available during my care of the patient were reviewed by me and considered in my medical decision making (see chart for details).     Oral exam unremarkable, strep test negative.  No sign of deep space infection.  Most likely irritation from vape versus postnasal drainage.  Declined COVID testing.  Recommended trying daily cetirizine, avoiding vaping temporarily.  Continue to monitor,Discussed strict return precautions. Patient verbalized understanding and is agreeable with plan.  Final Clinical Impressions(s) / UC Diagnoses   Final diagnoses:  Sore throat     Discharge Instructions     Sore Throat  Your rapid strep tested Negative today. Most likely viral or related to drainage  Please continue Tylenol or Ibuprofen for fever and pain. May try salt water gargles, cepacol lozenges, throat spray, or OTC cold relief medicine for throat discomfort. If you also have congestion take a daily anti-histamine like Zyrtec, Claritin, and a oral decongestant to help with post nasal drip that may be irritating your throat.   Stay hydrated and drink plenty of fluids to keep your throat coated relieve irritation.    ED Prescriptions    Medication Sig Dispense Auth. Provider   Cetirizine HCl 10 MG CAPS Take 1 capsule (10 mg total) by mouth daily. 15 capsule Teyonna Plaisted C, PA-C     Controlled Substance Prescriptions [place] Controlled Substance Registry consulted? Not Applicable   Lew DawesWieters, Alianna Wurster C, [place] JerseyPA-C 01/03/19 1032

## 2019-01-05 LAB — CULTURE, GROUP A STREP (THRC)

## 2019-04-12 DIAGNOSIS — O26892 Other specified pregnancy related conditions, second trimester: Secondary | ICD-10-CM | POA: Diagnosis not present

## 2019-04-12 DIAGNOSIS — O219 Vomiting of pregnancy, unspecified: Secondary | ICD-10-CM | POA: Diagnosis not present

## 2019-04-12 DIAGNOSIS — R109 Unspecified abdominal pain: Secondary | ICD-10-CM | POA: Diagnosis not present

## 2019-04-12 DIAGNOSIS — Z3A01 Less than 8 weeks gestation of pregnancy: Secondary | ICD-10-CM | POA: Diagnosis not present

## 2019-04-12 DIAGNOSIS — O23591 Infection of other part of genital tract in pregnancy, first trimester: Secondary | ICD-10-CM | POA: Diagnosis not present

## 2019-04-12 DIAGNOSIS — N854 Malposition of uterus: Secondary | ICD-10-CM | POA: Diagnosis not present

## 2019-04-13 ENCOUNTER — Inpatient Hospital Stay (HOSPITAL_COMMUNITY)
Admission: AD | Admit: 2019-04-13 | Discharge: 2019-04-13 | Disposition: A | Discharge status: Home / Self Care | Payer: Medicaid Other | Attending: Obstetrics and Gynecology | Admitting: Obstetrics and Gynecology

## 2019-04-13 ENCOUNTER — Encounter (HOSPITAL_COMMUNITY): Payer: Self-pay

## 2019-04-13 ENCOUNTER — Other Ambulatory Visit: Payer: Self-pay

## 2019-04-13 DIAGNOSIS — O210 Mild hyperemesis gravidarum: Secondary | ICD-10-CM | POA: Diagnosis not present

## 2019-04-13 DIAGNOSIS — Z3A01 Less than 8 weeks gestation of pregnancy: Secondary | ICD-10-CM | POA: Diagnosis not present

## 2019-04-13 LAB — URINALYSIS, ROUTINE W REFLEX MICROSCOPIC
Bacteria, UA: NONE SEEN
Bilirubin Urine: NEGATIVE
Glucose, UA: NEGATIVE mg/dL
Hgb urine dipstick: NEGATIVE
Ketones, ur: 20 mg/dL — AB
Leukocytes,Ua: NEGATIVE
Nitrite: NEGATIVE
Protein, ur: 30 mg/dL — AB
Specific Gravity, Urine: 1.031 — ABNORMAL HIGH (ref 1.005–1.030)
pH: 6 (ref 5.0–8.0)

## 2019-04-13 LAB — COMPREHENSIVE METABOLIC PANEL
ALT: 13 U/L (ref 0–44)
AST: 15 U/L (ref 15–41)
Albumin: 3.6 g/dL (ref 3.5–5.0)
Alkaline Phosphatase: 47 U/L (ref 38–126)
Anion gap: 9 (ref 5–15)
BUN: <5 mg/dL — ABNORMAL LOW (ref 6–20)
CO2: 20 mmol/L — ABNORMAL LOW (ref 22–32)
Calcium: 8.9 mg/dL (ref 8.9–10.3)
Chloride: 104 mmol/L (ref 98–111)
Creatinine, Ser: 0.63 mg/dL (ref 0.44–1.00)
GFR calc Af Amer: >60 mL/min (ref >60)
GFR calc non Af Amer: >60 mL/min (ref >60)
Glucose, Bld: 82 mg/dL (ref 70–99)
Potassium: 3.2 mmol/L — ABNORMAL LOW (ref 3.5–5.1)
Sodium: 133 mmol/L — ABNORMAL LOW (ref 135–145)
Total Bilirubin: 0.9 mg/dL (ref 0.3–1.2)
Total Protein: 6 g/dL — ABNORMAL LOW (ref 6.5–8.1)

## 2019-04-13 LAB — CBC
HCT: 33.9 % — ABNORMAL LOW (ref 36.0–46.0)
Hemoglobin: 11.7 g/dL — ABNORMAL LOW (ref 12.0–15.0)
MCH: 33 pg (ref 26.0–34.0)
MCHC: 34.5 g/dL (ref 30.0–36.0)
MCV: 95.5 fL (ref 80.0–100.0)
Platelets: 232 10*3/uL (ref 150–400)
RBC: 3.55 MIL/uL — ABNORMAL LOW (ref 3.87–5.11)
RDW: 12.1 % (ref 11.5–15.5)
WBC: 10.3 10*3/uL (ref 4.0–10.5)
nRBC: 0 % (ref 0.0–0.2)

## 2019-04-13 LAB — POCT PREGNANCY, URINE: Preg Test, Ur: POSITIVE — AB

## 2019-04-13 MED ORDER — ONDANSETRON 8 MG PO TBDP: 8.0000 mg | ORAL_TABLET | Freq: Three times a day (TID) | ORAL | 0 refills | Status: DC | PRN

## 2019-04-13 MED ORDER — DOCUSATE SODIUM 100 MG PO CAPS: 100.0000 mg | ORAL_CAPSULE | Freq: Two times a day (BID) | ORAL | 0 refills | Status: DC

## 2019-04-13 MED ORDER — ONDANSETRON 4 MG PO TBDP
8.0000 mg | ORAL_TABLET | Freq: Once | ORAL | Status: AC
  Administered 2019-04-13: 22:00:00 8 mg via ORAL
  Filled 2019-04-13: qty 2

## 2019-04-13 MED ORDER — PROMETHAZINE HCL 12.5 MG PO TABS: 12.5000 mg | ORAL_TABLET | Freq: Every day | ORAL | 1 refills | Status: DC

## 2019-04-13 MED ORDER — ACETAMINOPHEN 325 MG PO TABS
650.0000 mg | ORAL_TABLET | Freq: Once | ORAL | Status: AC
  Administered 2019-04-13: 22:00:00 650 mg via ORAL
  Filled 2019-04-13: qty 2

## 2019-04-13 NOTE — MAU Provider Note (Addendum)
Patient Andrea Carson is a 23 y.o. [phone] At [redacted]w[redacted]d here with complaints of nausea and vomiting that started on Sunday or Monday, about 5 days ago,  and a HA. She denies abdominal pain, VB, dysuria, SOB or other ob-gyn complaints.   She was seen at an Urgent Care in [place] on 11/6 and given reglanIV and an RX to take home she states it was not working at home.  She endorses daily marijuana use to help with her nausea.  History     CSN: [id number]  Arrival date and time: 04/13/19 7209   First Provider Initiated Contact with Patient 04/13/19 2103      Chief Complaint  Patient presents with  . Emesis   Emesis  This is a new problem. The current episode started in the past 7 days. The problem occurs more than 10 times per day. The emesis has an appearance of bile. There has been no fever. Pertinent negatives include no abdominal pain, chest pain, chills, coughing or fever.   Review of patient's records shows that her CMP was normal yesterday, ketones in her urine but not significant. She was given Zofran  IV 4 mg, which did not help, and then reglan IV which did help.  OB History    Gravida  7   Para  1   Term  1   Preterm  0   AB  4   Living  1     SAB  0   TAB  4   Ectopic  0   Multiple  0   Live Births  1           Past Medical History:  Diagnosis Date  . Constipation   . Eczema   . Eczema   . Environmental allergies   . Gall stones   . Infection    UTI  . Seasonal allergies     Past Surgical History:  Procedure Laterality Date  . ADENOIDECTOMY    . CHOLECYSTECTOMY N/A 07/31/2014   Procedure: LAPAROSCOPIC CHOLECYSTECTOMY WITH INTRAOPERATIVE CHOLANGIOGRAM;  Surgeon: [name], MD;  Location: [facility];  Service: General;  Laterality: N/A;  . THERAPEUTIC ABORTION     x3  . TONSILLECTOMY      Family History  Problem Relation Age of Onset  . Hypertension Maternal Grandmother   . Cancer Maternal Grandmother   . Hypertension Mother   . Cancer  Maternal Aunt   . Anxiety disorder Maternal Uncle   . Diabetes Paternal Grandmother     Social History   Tobacco Use  . Smoking status: Never Smoker  . Smokeless tobacco: Never Used  Substance Use Topics  . Alcohol use: No  . Drug use: Yes    Types: Marijuana    Comment: daily     Allergies:  Allergies  Allergen Reactions  . Bee Venom Anaphylaxis and Other (See Comments)    Pt has an epipen  . Fire [employer] Anaphylaxis  . Amoxicillin Rash    CHILDHOOD ALLERGY Has patient had a PCN reaction causing immediate rash, facial/tongue/throat swelling, SOB or lightheadedness with hypotension: Yes Has patient had a PCN reaction causing severe rash involving mucus membranes or skin necrosis: No Has patient had a PCN reaction that required hospitalization No Has patient had a PCN reaction occurring within the last 10 years: No  If all of the above answers are "NO", then may proceed with Cephalosporin use.     Medications Prior to Admission  Medication Sig Dispense Refill Last Dose  .  Cetirizine HCl 10 MG CAPS Take 1 capsule (10 mg total) by mouth daily. 15 capsule 0   . fluconazole (DIFLUCAN) 150 MG tablet Take 1 tablet (150 mg total) by mouth every 3 (three) days as needed. 2 tablet 0   . metoCLOPramide (REGLAN) 10 MG tablet Take 10 mg by mouth 4 (four) times daily.     . metroNIDAZOLE (FLAGYL) 500 MG tablet Take 1 tablet (500 mg total) by mouth 2 (two) times daily. One po bid x 7 days 14 tablet 0   . promethazine (PHENERGAN) 12.5 MG tablet Take 1 tablet (12.5 mg total) by mouth every 6 (six) hours as needed for nausea or vomiting. 30 tablet 0   . Doxylamine-Pyridoxine 10-10 MG TBEC Two tablets at bedtime on day 1 and 2; if symptoms persist, take 1 tablet in morning and 2 tablets at bedtime on day 3. 60 tablet 1     Review of Systems  Constitutional: Negative.  Negative for chills and fever.  HENT: Negative.   Respiratory: Negative.  Negative for cough.   Cardiovascular: Negative.   Negative for chest pain.  Gastrointestinal: Positive for vomiting. Negative for abdominal pain.  Genitourinary: Negative.   Musculoskeletal: Negative.   Neurological: Negative.    Physical Exam   Blood pressure 113/61, pulse 66, temperature 98.7 F (37.1 C), temperature source Oral, resp. rate 14, height 5' (1.524 m), weight 60.3 kg, last menstrual period 03/03/2019, SpO2 (!) 84 %, unknown if currently breastfeeding.  Physical Exam  Constitutional: She appears well-developed.  Eyes: Pupils are equal, round, and reactive to light.  Neck: Normal range of motion.  Respiratory: Effort normal.  GI: Soft.  Musculoskeletal: Normal range of motion.  Neurological: She is alert.  Skin: Skin is warm.    MAU Course  Procedures  MDM -will try Zofran ODT as Urgent care gave 4 mg and 8 mg dose may be more effective   Reviewed records from urgent care at [facility]. UA normal with 20 plus ketones, but no nitrities.  CMP yesterday was normal.  -baseline CMP and CBC done in MAU.  -CBC normal, CMP is 3.2 but patient feels better, feels that she can begin small meals so Kdur not ordered.   2321: patient tolerated Zofran and water; HA is now a 0/10.  She desires discharge.  Assessment and Plan   1. Morning sickness    -DC home with recommendations to start prenatal care in about  5 weeks.  -Explained to patient the need to wean off of smoking marijuana as harmful to developing fetus and may also contribute to abdominal pain and worsening nausea/vomiting in the future.  -RX for Zofran and colace given, plus phenergan at night. -Return precautions reviewed.  -Patient is unsure of where she will get her prenatal care; list of providers given.  Charlesetta Garibaldi Diamond Jentz 04/13/2019, 9:36 PM

## 2019-04-13 NOTE — MAU Note (Signed)
Presents with NV since Sunday. Pt saw [place] yesterday and was given Reglan and had an u/s. Reglan has not helped. C/o lower abd pain and HA's. No bldg.   Gilmer Mor RN

## 2019-04-13 NOTE — Discharge Instructions (Signed)
Morning Sickness  Morning sickness is when you feel sick to your stomach (nauseous) during pregnancy. You may feel sick to your stomach and throw up (vomit). You may feel sick in the morning, but you can feel this way at any time of day. Some women feel very sick to their stomach and cannot stop throwing up (hyperemesis gravidarum). Follow these instructions at home: Medicines  Take over-the-counter and prescription medicines only as told by your doctor. Do not take any medicines until you talk with your doctor about them first.  Taking multivitamins before getting pregnant can stop or lessen the harshness of morning sickness. Eating and drinking  Eat dry toast or crackers before getting out of bed.  Eat 5 or 6 small meals a day.  Eat dry and bland foods like rice and baked potatoes.  Do not eat greasy, fatty, or spicy foods.  Have someone cook for you if the smell of food causes you to feel sick or throw up.  If you feel sick to your stomach after taking prenatal vitamins, take them at night or with a snack.  Eat protein when you need a snack. Nuts, yogurt, and cheese are good choices.  Drink fluids throughout the day.  Try ginger ale made with real ginger, ginger tea made from fresh grated ginger, or ginger candies. General instructions  Do not use any products that have nicotine or tobacco in them, such as cigarettes and e-cigarettes. If you need help quitting, ask your doctor.  Use an air purifier to keep the air in your house free of smells.  Get lots of fresh air.  Try to avoid smells that make you feel sick.  Try: ? Wearing a bracelet that is used for seasickness (acupressure wristband). ? Going to a doctor who puts thin needles into certain body points (acupuncture) to improve how you feel. Contact a doctor if:  You need medicine to feel better.  You feel dizzy or light-headed.  You are losing weight. Get help right away if:  You feel very sick to your  stomach and cannot stop throwing up.  You pass out (faint).  You have very bad pain in your belly. Summary  Morning sickness is when you feel sick to your stomach (nauseous) during pregnancy.  You may feel sick in the morning, but you can feel this way at any time of day.  Making some changes to what you eat may help your symptoms go away. This information is not intended to replace advice given to you by your health care provider. Make sure you discuss any questions you have with your health care provider. Document Released: 06/30/2004 Document Revised: 05/05/2017 Document Reviewed: 06/23/2016 Elsevier Patient Education  2020 Elsevier Inc.  

## 2019-06-28 DIAGNOSIS — Z20822 Contact with and (suspected) exposure to covid-19: Secondary | ICD-10-CM | POA: Diagnosis not present

## 2019-07-09 DIAGNOSIS — R109 Unspecified abdominal pain: Secondary | ICD-10-CM | POA: Diagnosis not present

## 2019-07-09 DIAGNOSIS — R1030 Lower abdominal pain, unspecified: Secondary | ICD-10-CM | POA: Diagnosis not present

## 2019-07-09 DIAGNOSIS — Z3A01 Less than 8 weeks gestation of pregnancy: Secondary | ICD-10-CM | POA: Diagnosis not present

## 2019-07-09 DIAGNOSIS — O26891 Other specified pregnancy related conditions, first trimester: Secondary | ICD-10-CM | POA: Diagnosis not present

## 2019-07-09 DIAGNOSIS — O210 Mild hyperemesis gravidarum: Secondary | ICD-10-CM | POA: Diagnosis not present

## 2019-07-10 DIAGNOSIS — R109 Unspecified abdominal pain: Secondary | ICD-10-CM | POA: Diagnosis not present

## 2019-07-10 DIAGNOSIS — O26891 Other specified pregnancy related conditions, first trimester: Secondary | ICD-10-CM | POA: Diagnosis not present

## 2019-07-10 DIAGNOSIS — Z3A01 Less than 8 weeks gestation of pregnancy: Secondary | ICD-10-CM | POA: Diagnosis not present

## 2019-07-11 DIAGNOSIS — Z113 Encounter for screening for infections with a predominantly sexual mode of transmission: Secondary | ICD-10-CM | POA: Diagnosis not present

## 2019-07-11 DIAGNOSIS — Z124 Encounter for screening for malignant neoplasm of cervix: Secondary | ICD-10-CM | POA: Diagnosis not present

## 2019-07-11 DIAGNOSIS — Z3481 Encounter for supervision of other normal pregnancy, first trimester: Secondary | ICD-10-CM | POA: Diagnosis not present

## 2019-07-19 DIAGNOSIS — O3680X1 Pregnancy with inconclusive fetal viability, fetus 1: Secondary | ICD-10-CM | POA: Diagnosis not present

## 2019-08-01 DIAGNOSIS — O99891 Other specified diseases and conditions complicating pregnancy: Secondary | ICD-10-CM | POA: Diagnosis not present

## 2019-08-01 DIAGNOSIS — O210 Mild hyperemesis gravidarum: Secondary | ICD-10-CM | POA: Diagnosis not present

## 2019-08-01 DIAGNOSIS — R1084 Generalized abdominal pain: Secondary | ICD-10-CM | POA: Diagnosis not present

## 2019-08-01 DIAGNOSIS — Z3A09 9 weeks gestation of pregnancy: Secondary | ICD-10-CM | POA: Diagnosis not present

## 2019-08-01 DIAGNOSIS — R825 Elevated urine levels of drugs, medicaments and biological substances: Secondary | ICD-10-CM | POA: Diagnosis not present

## 2019-08-01 DIAGNOSIS — O26891 Other specified pregnancy related conditions, first trimester: Secondary | ICD-10-CM | POA: Diagnosis not present

## 2019-10-06 DIAGNOSIS — R42 Dizziness and giddiness: Secondary | ICD-10-CM | POA: Diagnosis not present

## 2019-10-06 DIAGNOSIS — R0602 Shortness of breath: Secondary | ICD-10-CM | POA: Diagnosis not present

## 2019-10-06 DIAGNOSIS — N760 Acute vaginitis: Secondary | ICD-10-CM | POA: Diagnosis not present

## 2019-10-06 DIAGNOSIS — B349 Viral infection, unspecified: Secondary | ICD-10-CM | POA: Diagnosis not present

## 2019-10-06 DIAGNOSIS — Z113 Encounter for screening for infections with a predominantly sexual mode of transmission: Secondary | ICD-10-CM | POA: Diagnosis not present

## 2019-10-06 DIAGNOSIS — B9689 Other specified bacterial agents as the cause of diseases classified elsewhere: Secondary | ICD-10-CM | POA: Diagnosis not present

## 2019-10-07 DIAGNOSIS — R0602 Shortness of breath: Secondary | ICD-10-CM | POA: Diagnosis not present

## 2019-10-19 ENCOUNTER — Other Ambulatory Visit: Payer: Self-pay

## 2019-10-19 ENCOUNTER — Encounter (HOSPITAL_COMMUNITY): Payer: Self-pay | Admitting: *Deleted

## 2019-10-19 ENCOUNTER — Ambulatory Visit (HOSPITAL_COMMUNITY)
Admission: EM | Admit: 2019-10-19 | Discharge: 2019-10-19 | Disposition: A | Discharge status: Home / Self Care | Payer: Medicaid Other | Attending: Family Medicine | Admitting: Family Medicine

## 2019-10-19 DIAGNOSIS — N898 Other specified noninflammatory disorders of vagina: Secondary | ICD-10-CM | POA: Diagnosis not present

## 2019-10-19 DIAGNOSIS — N949 Unspecified condition associated with female genital organs and menstrual cycle: Secondary | ICD-10-CM | POA: Diagnosis not present

## 2019-10-19 HISTORY — DX: Acute vaginitis: N76.0

## 2019-10-19 HISTORY — DX: Other specified bacterial agents as the cause of diseases classified elsewhere: B96.89

## 2019-10-19 MED ORDER — FLUCONAZOLE 150 MG PO TABS: ORAL_TABLET | ORAL | 0 refills | Status: DC

## 2019-10-19 NOTE — Discharge Instructions (Signed)
I  have sent in Diflucan to your pharmacy for you to take one tablet today, and the second tablet in 3 days if you are still experiencing symptoms.  Follow up as needed.

## 2019-10-19 NOTE — ED Provider Notes (Signed)
[facility]   [id number] 10/19/19 Arrival Time: 1246   CC: VAGINAL DISCHARGE  SUBJECTIVE:  Andrea Carson is a 24 y.o. female who presents with complaints of  gradual vaginal discharge that began 7 days ago.  Reports recent antibiotic use for BV.  Patient is sexually active with 1of female partners.  Describes discharge as thick white.  She has tried no OTC medications.  She reports worsening symptoms with voiding and sexual intercourse.  She reports similar symptoms in the past and was diagnosed with yeast and treated with diflucan.  She denies fever, chills, nausea, vomiting, abdominal or pelvic pain, urinary symptoms, vaginal itching, vaginal odor, vaginal bleeding,  vaginal rashes or lesions.   Patient's last menstrual period was 10/10/2019 (exact date). Current birth control method: none  Compliant with BC:  ROS: As per HPI.  All other pertinent ROS negative.     Past Medical History:  Diagnosis Date  . BV (bacterial vaginosis)   . Constipation   . Eczema   . Eczema   . Environmental allergies   . Gall stones   . Infection    UTI  . Seasonal allergies    Past Surgical History:  Procedure Laterality Date  . ADENOIDECTOMY    . CHOLECYSTECTOMY N/A 07/31/2014   Procedure: LAPAROSCOPIC CHOLECYSTECTOMY WITH INTRAOPERATIVE CHOLANGIOGRAM;  Surgeon: Avel Peace, MD;  Location: [facility];  Service: General;  Laterality: N/A;  . THERAPEUTIC ABORTION     x3  . TONSILLECTOMY     Allergies  Allergen Reactions  . Bee Venom Anaphylaxis and Other (See Comments)    Pt has an epipen  . Fire [employer] Anaphylaxis  . Amoxicillin Rash    CHILDHOOD ALLERGY Has patient had a PCN reaction causing immediate rash, facial/tongue/throat swelling, SOB or lightheadedness with hypotension: Yes Has patient had a PCN reaction causing severe rash involving mucus membranes or skin necrosis: No Has patient had a PCN reaction that required hospitalization No Has patient had a PCN reaction occurring  within the last 10 years: No  If all of the above answers are "NO", then may proceed with Cephalosporin use.    No current facility-administered medications on file prior to encounter.   Current Outpatient Medications on File Prior to Encounter  Medication Sig Dispense Refill  . Cetirizine HCl 10 MG CAPS Take 1 capsule (10 mg total) by mouth daily. 15 capsule 0  . docusate sodium (COLACE) 100 MG capsule Take 1 capsule (100 mg total) by mouth every 12 (twelve) hours. 60 capsule 0  . Doxylamine-Pyridoxine 10-10 MG TBEC Two tablets at bedtime on day 1 and 2; if symptoms persist, take 1 tablet in morning and 2 tablets at bedtime on day 3. 60 tablet 1  . ondansetron (ZOFRAN ODT) 8 MG disintegrating tablet Take 1 tablet (8 mg total) by mouth every 8 (eight) hours as needed for nausea or vomiting. 90 tablet 0  . promethazine (PHENERGAN) 12.5 MG tablet Take 1 tablet (12.5 mg total) by mouth at bedtime. 30 tablet 1    Social History   Socioeconomic History  . Marital status: Single    Spouse name: Not on file  . Number of children: Not on file  . Years of education: Not on file  . Highest education level: Not on file  Occupational History  . Not on file  Tobacco Use  . Smoking status: Never Smoker  . Smokeless tobacco: Never Used  Substance and Sexual Activity  . Alcohol use: No  . Drug use: Yes  Types: Marijuana    Comment: daily   . Sexual activity: Yes    Birth control/protection: None  Other Topics Concern  . Not on file  Social History Narrative  . Not on file   Social Determinants of Health   Financial Resource Strain:   . Difficulty of Paying Living Expenses:   Food Insecurity:   . Worried About Charity fundraiser in the Last Year:   . Arboriculturist in the Last Year:   Transportation Needs:   . Film/video editor (Medical):   Marland Kitchen Lack of Transportation (Non-Medical):   Physical Activity:   . Days of Exercise per Week:   . Minutes of Exercise per Session:    Stress:   . Feeling of Stress :   Social Connections:   . Frequency of Communication with Friends and Family:   . Frequency of Social Gatherings with Friends and Family:   . Attends Religious Services:   . Active Member of Clubs or Organizations:   . Attends Archivist Meetings:   Marland Kitchen Marital Status:   Intimate Partner Violence:   . Fear of Current or Ex-Partner:   . Emotionally Abused:   Marland Kitchen Physically Abused:   . Sexually Abused:    Family History  Problem Relation Age of Onset  . Hypertension Maternal Grandmother   . Cancer Maternal Grandmother   . Hypertension Mother   . Cancer Maternal Aunt   . Anxiety disorder Maternal Uncle   . Diabetes Paternal Grandmother     OBJECTIVE:  Vitals:   10/19/19 1406  BP: 101/67  Pulse: 94  Resp: 16  Temp: 98.8 F (37.1 C)  TempSrc: Oral  SpO2: 99%     General appearance: Alert, NAD, appears stated age Head: NCAT Throat: lips, mucosa, and tongue normal; teeth and gums normal Lungs: CTA bilaterally without adventitious breath sounds Heart: regular rate and rhythm.  Radial pulses 2+ symmetrical bilaterally Back: no CVA tenderness Abdomen: soft, non-tender; bowel sounds normal; no masses or organomegaly; no guarding or rebound tenderness GU: External examination without vulvar lesions or erythema  Cervical swab obtained Skin: warm and dry Psychological:  Alert and cooperative. Normal mood and affect.  LABS:  Results for orders placed or performed during the hospital encounter of 04/13/19  Urinalysis, Routine w reflex microscopic  Result Value Ref Range   Color, Urine AMBER (A) YELLOW   APPearance CLOUDY (A) CLEAR   Specific Gravity, Urine 1.031 (H) 1.005 - 1.030   pH 6.0 5.0 - 8.0   Glucose, UA NEGATIVE NEGATIVE mg/dL   Hgb urine dipstick NEGATIVE NEGATIVE   Bilirubin Urine NEGATIVE NEGATIVE   Ketones, ur 20 (A) NEGATIVE mg/dL   Protein, ur 30 (A) NEGATIVE mg/dL   Nitrite NEGATIVE NEGATIVE   Leukocytes,Ua NEGATIVE  NEGATIVE   RBC / HPF 0-5 0 - 5 RBC/hpf   WBC, UA 0-5 0 - 5 WBC/hpf   Bacteria, UA NONE SEEN NONE SEEN   Squamous Epithelial / LPF 11-20 0 - 5   Mucus PRESENT    Hyaline Casts, UA PRESENT   CBC  Result Value Ref Range   WBC 10.3 4.0 - 10.5 K/uL   RBC 3.55 (L) 3.87 - 5.11 MIL/uL   Hemoglobin 11.7 (L) 12.0 - 15.0 g/dL   HCT 33.9 (L) 36.0 - 46.0 %   MCV 95.5 80.0 - 100.0 fL   MCH 33.0 26.0 - 34.0 pg   MCHC 34.5 30.0 - 36.0 g/dL   RDW 12.1 11.5 -  15.5 %   Platelets 232 150 - 400 K/uL   nRBC 0.0 0.0 - 0.2 %  Comprehensive metabolic panel  Result Value Ref Range   Sodium 133 (L) 135 - 145 mmol/L   Potassium 3.2 (L) 3.5 - 5.1 mmol/L   Chloride 104 98 - 111 mmol/L   CO2 20 (L) 22 - 32 mmol/L   Glucose, Bld 82 70 - 99 mg/dL   BUN <5 (L) 6 - 20 mg/dL   Creatinine, Ser 9.14 0.44 - 1.00 mg/dL   Calcium 8.9 8.9 - 78.2 mg/dL   Total Protein 6.0 (L) 6.5 - 8.1 g/dL   Albumin 3.6 3.5 - 5.0 g/dL   AST 15 15 - 41 U/L   ALT 13 0 - 44 U/L   Alkaline Phosphatase 47 38 - 126 U/L   Total Bilirubin 0.9 0.3 - 1.2 mg/dL   GFR calc non Af Amer >60 >60 mL/min   GFR calc Af Amer >60 >60 mL/min   Anion gap 9 5 - 15  Pregnancy, urine POC  Result Value Ref Range   Preg Test, Ur POSITIVE (A) NEGATIVE    Labs Reviewed  CERVICOVAGINAL ANCILLARY ONLY    ASSESSMENT & PLAN:  1. Vaginal itching   2. Vaginal burning     Meds ordered this encounter  Medications  . fluconazole (DIFLUCAN) 150 MG tablet    Sig: Take one tablet at the onset of symptoms, if still having symptoms in 3 days, take the second tablet.    Dispense:  2 tablet    Refill:  0    Order Specific Question:   Supervising Provider    Answer:   Merrilee Jansky [[id number]]    Pending: Labs Reviewed  CERVICOVAGINAL ANCILLARY ONLY    Prescribed Diflucan. Take one today and take one in 3 days if still having symptoms. Vaginal swab obtained. We will follow up with you regarding abnormal results Take medications as prescribed and  to completion If tests results are positive, please abstain from sexual activity until you and your partner(s) have been treated Follow up with PCP or [facility] if symptoms persists Return here or go to ER if you have any new or worsening symptoms fever, chills, nausea, vomiting, abdominal or pelvic pain, painful intercourse, vaginal discharge, vaginal bleeding, persistent symptoms despite treatment, etc...  Reviewed expectations re: course of current medical issues. Questions answered. Outlined signs and symptoms indicating need for more acute intervention. Patient verbalized understanding. After Visit Summary given.         Moshe Cipro, NP 10/19/19 1433

## 2019-10-19 NOTE — ED Triage Notes (Signed)
Pt reports recently finishing treatment for BV recently; states usually gets Rx for Diflucan when being treated for BV, but states provider told her to wait and see if she develops sxs.  Now c/o vaginal irritation, and describes white discharge and white area on external genitalia over past 2 days.

## 2019-10-21 ENCOUNTER — Telehealth (HOSPITAL_COMMUNITY): Payer: Self-pay | Admitting: Orthopedic Surgery

## 2019-10-21 LAB — CERVICOVAGINAL ANCILLARY ONLY
Bacterial Vaginitis (gardnerella): NEGATIVE
Candida Glabrata: NEGATIVE
Candida Vaginitis: NEGATIVE
Chlamydia: NEGATIVE
Comment: NEGATIVE
Comment: NEGATIVE
Comment: NEGATIVE
Comment: NEGATIVE
Comment: NEGATIVE
Comment: NORMAL
Neisseria Gonorrhea: NEGATIVE
Trichomonas: POSITIVE — AB

## 2019-10-21 MED ORDER — METRONIDAZOLE 500 MG PO TABS: 500.0000 mg | ORAL_TABLET | Freq: Two times a day (BID) | ORAL | 0 refills | Status: DC

## 2019-10-29 ENCOUNTER — Telehealth (HOSPITAL_COMMUNITY): Payer: Self-pay | Admitting: Emergency Medicine

## 2019-10-29 MED ORDER — FLUCONAZOLE 150 MG PO TABS: ORAL_TABLET | ORAL | 0 refills | Status: DC

## 2019-10-29 NOTE — Telephone Encounter (Signed)
Resending rx for diflucan

## 2019-11-04 ENCOUNTER — Telehealth (HOSPITAL_COMMUNITY): Payer: Self-pay | Admitting: Orthopedic Surgery

## 2019-11-04 MED ORDER — METRONIDAZOLE 500 MG PO TABS: 500.0000 mg | ORAL_TABLET | Freq: Two times a day (BID) | ORAL | 0 refills | Status: DC

## 2019-11-04 NOTE — Telephone Encounter (Signed)
Pt calling in stating that someone was supposed to reorder Flagyl, (she lost her pill bottle). She is stating they ordered Diflucan and not Flagyl.  MD Delton See notified of the above.  Per MD Delton See verbal order to reorder Flagyl 500 mg BID X7 days. Avoid sexual intercourse for 7 days until medicine is complete.  Escript sent to pharmacy on file.  Pt notified and verbalized understanding, had questions answered.

## 2019-11-13 ENCOUNTER — Other Ambulatory Visit: Payer: Self-pay

## 2019-11-13 ENCOUNTER — Ambulatory Visit
Admission: EM | Admit: 2019-11-13 | Discharge: 2019-11-13 | Disposition: A | Discharge status: Home / Self Care | Payer: Medicaid Other | Attending: Emergency Medicine | Admitting: Emergency Medicine

## 2019-11-13 DIAGNOSIS — A599 Trichomoniasis, unspecified: Secondary | ICD-10-CM

## 2019-11-13 DIAGNOSIS — Z113 Encounter for screening for infections with a predominantly sexual mode of transmission: Secondary | ICD-10-CM

## 2019-11-13 MED ORDER — METRONIDAZOLE 500 MG PO TABS: 500.0000 mg | ORAL_TABLET | Freq: Once | ORAL | 0 refills | Status: AC

## 2019-11-13 MED ORDER — METRONIDAZOLE 500 MG PO TABS: 2000.0000 mg | ORAL_TABLET | Freq: Once | ORAL | Status: DC

## 2019-11-13 NOTE — Discharge Instructions (Signed)
Very important to take all 4 tablets at once with food. Your partner needs to be treated today as well. No intercourse of any form for the next week. May repeat screening in 3 months or sooner if you develop discharge, pain.

## 2019-11-13 NOTE — ED Triage Notes (Signed)
Pt states she competed her anitbiotics for trich yesterday, wants to be checked that its gone. Denies vaginal discharge.

## 2019-11-13 NOTE — ED Provider Notes (Signed)
[name]    CSN: [id number] Arrival date & time: 11/13/19  1351      History   Chief Complaint Chief Complaint  Patient presents with  . SEXUALLY TRANSMITTED DISEASE    HPI Andrea Carson is a 24 y.o. female presenting for STI recheck.  States that she finished her antibiotics for trichomonas yesterday: Was compliant with twice daily dosing of 7-day course of metronidazole.  Patient does admit to unprotected intercourse earlier this week with same partner.  States her partner was tested and treated, though unsure when.  Patient denying vaginal discharge or pain, pelvic pain, abdominal pain, back pain, fever.  No urinary symptoms such as urgency, frequency, hematuria.  Currently sexually active with 1 female partner, not using condoms.  Currently on menstrual cycle.  Past Medical History:  Diagnosis Date  . BV (bacterial vaginosis)   . Constipation   . Eczema   . Eczema   . Environmental allergies   . Gall stones   . Infection    UTI  . Seasonal allergies     Patient Active Problem List   Diagnosis Date Noted  . Cholelithiasis with acute cholecystitis 07/30/2014  . Indication for care in labor and delivery, antepartum 05/31/2014  . NSVD (normal spontaneous vaginal delivery) 05/31/2014    Past Surgical History:  Procedure Laterality Date  . ADENOIDECTOMY    . CHOLECYSTECTOMY N/A 07/31/2014   Procedure: LAPAROSCOPIC CHOLECYSTECTOMY WITH INTRAOPERATIVE CHOLANGIOGRAM;  Surgeon: [name], MD;  Location: [facility];  Service: General;  Laterality: N/A;  . THERAPEUTIC ABORTION     x3  . TONSILLECTOMY      OB History    Gravida  7   Para  1   Term  1   Preterm  0   AB  4   Living  1     SAB  0   TAB  4   Ectopic  0   Multiple  0   Live Births  1            Home Medications    Prior to Admission medications   Medication Sig Start Date End Date Taking? Authorizing Provider  Cetirizine HCl 10 MG CAPS Take 1 capsule (10 mg total) by  mouth daily. 01/02/19   Wieters, Hallie C, PA-C  docusate sodium (COLACE) 100 MG capsule Take 1 capsule (100 mg total) by mouth every 12 (twelve) hours. 04/13/19   [name], CNM  Doxylamine-Pyridoxine 10-10 MG TBEC Two tablets at bedtime on day 1 and 2; if symptoms persist, take 1 tablet in morning and 2 tablets at bedtime on day 3. 01/16/18   Luvenia Redden, PA-C  fluconazole (DIFLUCAN) 150 MG tablet Take one tablet at the onset of symptoms, if still having symptoms in 3 days, take the second tablet. 10/29/19   Wieters, Hallie C, PA-C  metroNIDAZOLE (FLAGYL) 500 MG tablet Take 1 tablet (500 mg total) by mouth once for 1 dose. Take 4 tablets by mouth at once with food.  Please give 4 tablets to partner today 11/13/19 11/13/19  Hall-Potvin, [place], PA-C  ondansetron (ZOFRAN ODT) 8 MG disintegrating tablet Take 1 tablet (8 mg total) by mouth every 8 (eight) hours as needed for nausea or vomiting. 04/13/19   [name], CNM  promethazine (PHENERGAN) 12.5 MG tablet Take 1 tablet (12.5 mg total) by mouth at bedtime. 04/13/19   [name], CNM    Family History Family History  Problem Relation Age of Onset  .  Hypertension Maternal Grandmother   . Cancer Maternal Grandmother   . Hypertension Mother   . Cancer Maternal Aunt   . Anxiety disorder Maternal Uncle   . Diabetes Paternal Grandmother     Social History Social History   Tobacco Use  . Smoking status: Never Smoker  . Smokeless tobacco: Never Used  Substance Use Topics  . Alcohol use: No  . Drug use: Yes    Types: Marijuana    Comment: daily      Allergies   Bee venom, Fire ant, and Amoxicillin   Review of Systems As per HPI   Physical Exam Triage Vital Signs ED Triage Vitals  Enc Vitals Group     BP 11/13/19 1409 113/68     Pulse Rate 11/13/19 1409 97     Resp 11/13/19 1409 18     Temp 11/13/19 1409 99.2 F (37.3 C)     Temp Source 11/13/19 1409 Oral     SpO2 11/13/19 1409  98 %     Weight --      Height --      Head Circumference --      Peak Flow --      Pain Score 11/13/19 1410 0     Pain Loc --      Pain Edu? --      Excl. in GC? --    No data found.  Updated Vital Signs BP 113/68 (BP Location: Left Arm)   Pulse 97   Temp 99.2 F (37.3 C) (Oral)   Resp 18   LMP 11/11/2019   SpO2 98%   Visual Acuity Right Eye Distance:   Left Eye Distance:   Bilateral Distance:    Right Eye Near:   Left Eye Near:    Bilateral Near:     Physical Exam Constitutional:      General: She is not in acute distress. HENT:     Head: Normocephalic and atraumatic.  Eyes:     General: No scleral icterus.    Pupils: Pupils are equal, round, and reactive to light.  Cardiovascular:     Rate and Rhythm: Normal rate.  Pulmonary:     Effort: Pulmonary effort is normal.  Skin:    Coloration: Skin is not jaundiced or pale.  Neurological:     Mental Status: She is alert and oriented to person, place, and time.      UC Treatments / Results  Labs (all labs ordered are listed, but only abnormal results are displayed) Labs Reviewed - No data to display  EKG   Radiology No results found.  Procedures Procedures (including critical care time)  Medications Ordered in UC Medications - No data to display  Initial Impression / Assessment and Plan / UC Course  I have reviewed the triage vital signs and the nursing notes.  Pertinent labs & imaging results that were available during my care of the patient were reviewed by me and considered in my medical decision making (see chart for details).     Patient afebrile, nontoxic, without pain or discharge.  Patient tested positive for trichomoniasis in May per chart review, just completed antibiotic course, though did have unprotected intercourse with same partner who she is unsure if they completed treatment prior to last coitus.  Discussed safe sex practices and transmission of STIs with patient.  Will provide 8  tablets of Flagyl 500 mg: 4 tablets for patient to take, 4 tablets for expedited partner treatment.  Return precautions discussed, patient  verbalized understanding and is agreeable to plan. Final Clinical Impressions(s) / UC Diagnoses   Final diagnoses:  Trichomonas infection     Discharge Instructions     Very important to take all 4 tablets at once with food. Your partner needs to be treated today as well. No intercourse of any form for the next week. May repeat screening in 3 months or sooner if you develop discharge, pain.    ED Prescriptions    Medication Sig Dispense Auth. Provider   metroNIDAZOLE (FLAGYL) 500 MG tablet Take 1 tablet (500 mg total) by mouth once for 1 dose. Take 4 tablets by mouth at once with food.  Please give 4 tablets to partner today 8 tablet Hall-Potvin, [place], PA-C     PDMP not reviewed this encounter.   Hall-Potvin, [place], [place] 11/13/19 1507

## 2019-11-20 DIAGNOSIS — N760 Acute vaginitis: Secondary | ICD-10-CM | POA: Diagnosis not present

## 2019-11-21 DIAGNOSIS — Z202 Contact with and (suspected) exposure to infections with a predominantly sexual mode of transmission: Secondary | ICD-10-CM | POA: Diagnosis not present

## 2019-11-30 ENCOUNTER — Encounter (HOSPITAL_BASED_OUTPATIENT_CLINIC_OR_DEPARTMENT_OTHER): Payer: Self-pay | Admitting: Emergency Medicine

## 2019-11-30 ENCOUNTER — Other Ambulatory Visit: Payer: Self-pay

## 2019-11-30 ENCOUNTER — Emergency Department (HOSPITAL_BASED_OUTPATIENT_CLINIC_OR_DEPARTMENT_OTHER)
Admission: EM | Admit: 2019-11-30 | Discharge: 2019-11-30 | Disposition: A | Discharge status: Home / Self Care | Payer: Medicaid Other | Attending: Emergency Medicine | Admitting: Emergency Medicine

## 2019-11-30 DIAGNOSIS — F17290 Nicotine dependence, other tobacco product, uncomplicated: Secondary | ICD-10-CM | POA: Insufficient documentation

## 2019-11-30 DIAGNOSIS — Y990 Civilian activity done for income or pay: Secondary | ICD-10-CM | POA: Diagnosis not present

## 2019-11-30 DIAGNOSIS — Y9389 Activity, other specified: Secondary | ICD-10-CM | POA: Diagnosis not present

## 2019-11-30 DIAGNOSIS — X501XXA Overexertion from prolonged static or awkward postures, initial encounter: Secondary | ICD-10-CM | POA: Insufficient documentation

## 2019-11-30 DIAGNOSIS — M545 Low back pain, unspecified: Secondary | ICD-10-CM

## 2019-11-30 DIAGNOSIS — R140 Abdominal distension (gaseous): Secondary | ICD-10-CM | POA: Insufficient documentation

## 2019-11-30 DIAGNOSIS — Y9289 Other specified places as the place of occurrence of the external cause: Secondary | ICD-10-CM | POA: Insufficient documentation

## 2019-11-30 LAB — URINALYSIS, ROUTINE W REFLEX MICROSCOPIC
Bilirubin Urine: NEGATIVE
Glucose, UA: NEGATIVE mg/dL
Hgb urine dipstick: NEGATIVE
Ketones, ur: NEGATIVE mg/dL
Leukocytes,Ua: NEGATIVE
Nitrite: NEGATIVE
Protein, ur: NEGATIVE mg/dL
Specific Gravity, Urine: 1.025 (ref 1.005–1.030)
pH: 6.5 (ref 5.0–8.0)

## 2019-11-30 LAB — CBC
HCT: 44.5 % (ref 36.0–46.0)
Hemoglobin: 14.1 g/dL (ref 12.0–15.0)
MCH: 32.7 pg (ref 26.0–34.0)
MCHC: 31.7 g/dL (ref 30.0–36.0)
MCV: 103.2 fL — ABNORMAL HIGH (ref 80.0–100.0)
Platelets: 156 10*3/uL (ref 150–400)
RBC: 4.31 MIL/uL (ref 3.87–5.11)
RDW: 13.1 % (ref 11.5–15.5)
WBC: 4.5 10*3/uL (ref 4.0–10.5)
nRBC: 0 % (ref 0.0–0.2)

## 2019-11-30 LAB — COMPREHENSIVE METABOLIC PANEL
ALT: 19 U/L (ref 0–44)
AST: 18 U/L (ref 15–41)
Albumin: 4.3 g/dL (ref 3.5–5.0)
Alkaline Phosphatase: 60 U/L (ref 38–126)
Anion gap: 9 (ref 5–15)
BUN: 10 mg/dL (ref 6–20)
CO2: 22 mmol/L (ref 22–32)
Calcium: 8.7 mg/dL — ABNORMAL LOW (ref 8.9–10.3)
Chloride: 106 mmol/L (ref 98–111)
Creatinine, Ser: 0.73 mg/dL (ref 0.44–1.00)
GFR calc Af Amer: >60 mL/min (ref >60)
GFR calc non Af Amer: >60 mL/min (ref >60)
Glucose, Bld: 123 mg/dL — ABNORMAL HIGH (ref 70–99)
Potassium: 3.8 mmol/L (ref 3.5–5.1)
Sodium: 137 mmol/L (ref 135–145)
Total Bilirubin: 0.4 mg/dL (ref 0.3–1.2)
Total Protein: 7 g/dL (ref 6.5–8.1)

## 2019-11-30 LAB — PREGNANCY, URINE: Preg Test, Ur: NEGATIVE

## 2019-11-30 LAB — LIPASE, BLOOD: Lipase: 35 U/L (ref 11–51)

## 2019-11-30 MED ORDER — NAPROXEN 250 MG PO TABS
500.0000 mg | ORAL_TABLET | Freq: Once | ORAL | Status: AC
  Administered 2019-11-30: 500 mg via ORAL
  Filled 2019-11-30: qty 2

## 2019-11-30 MED ORDER — NAPROXEN 500 MG PO TABS: 500.0000 mg | ORAL_TABLET | Freq: Two times a day (BID) | ORAL | 0 refills | Status: DC

## 2019-11-30 NOTE — ED Triage Notes (Signed)
Low abd pain and low back pain for several days. Denies vaginal discharge, dysuria.

## 2019-11-30 NOTE — ED Provider Notes (Signed)
[facility] DEPARTMENT Provider Note   CSN: [id number] Arrival date & time: 11/30/19  1620     History Chief Complaint  Patient presents with  . Back Pain  . Abdominal Pain    Andrea Carson is a 24 y.o. female presenting for evaluation of lower abdominal bloating/pressure and back pain.  Patient states that the past 10 to 7 days she has been having lower abdominal bloating/pressure as well as low back pain.  She has been seen multiple times in the past several weeks for STI testing, was tested positive and treated for trichomoniasis, later for BV.  She had negative gonorrhea and Chlamydia testing.  However just after receiving those results, patient found out her boyfriend with whom she has unprotected sex tested positive for chlamydia.  She was not treated.  She also reports her LMP was irregular, was short, only 2 days long, and then few days later she had some spotting.  She has had no further bleeding.  Patient had a miscarriage in March 2021.  She denies fevers, chills, nausea, vomiting, upper abdominal pain, vaginal discharge, urinary symptoms, abnormal bowel movements.  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  She has not taken anything for pain including Tylenol ibuprofen.  Patient states during her recent visits for STIs, she has not had a pelvic exam, only self swabs.  She does not have a PCP or OB/GYN currently.  She states she has no medical problems, takes no medications daily.  HPI     Past Medical History:  Diagnosis Date  . BV (bacterial vaginosis)   . Constipation   . Eczema   . Eczema   . Environmental allergies   . Gall stones   . Infection    UTI  . Seasonal allergies     Patient Active Problem List   Diagnosis Date Noted  . Cholelithiasis with acute cholecystitis 07/30/2014  . Indication for care in labor and delivery, antepartum 05/31/2014  . NSVD (normal spontaneous  vaginal delivery) 05/31/2014    Past Surgical History:  Procedure Laterality Date  . ADENOIDECTOMY    . CHOLECYSTECTOMY N/A 07/31/2014   Procedure: LAPAROSCOPIC CHOLECYSTECTOMY WITH INTRAOPERATIVE CHOLANGIOGRAM;  Surgeon: [name], MD;  Location: [facility];  Service: General;  Laterality: N/A;  . THERAPEUTIC ABORTION     x3  . TONSILLECTOMY       OB History    Gravida  7   Para  1   Term  1   Preterm  0   AB  4   Living  1     SAB  0   TAB  4   Ectopic  0   Multiple  0   Live Births  1           Family History  Problem Relation Age of Onset  . Hypertension Maternal Grandmother   . Cancer Maternal Grandmother   . Hypertension Mother   . Cancer Maternal Aunt   . Anxiety disorder Maternal Uncle   . Diabetes Paternal Grandmother     Social History   Tobacco Use  . Smoking status: Never Smoker  . Smokeless tobacco: Never Used  Vaping Use  . Vaping Use: Former  Substance Use Topics  . Alcohol use: No  . Drug use: Yes    Types: Marijuana    Comment: daily     Home Medications Prior to Admission medications  Medication Sig Start Date End Date Taking? Authorizing Provider  Cetirizine HCl 10 MG CAPS Take 1 capsule (10 mg total) by mouth daily. 01/02/19   Wieters, Hallie C, PA-C  docusate sodium (COLACE) 100 MG capsule Take 1 capsule (100 mg total) by mouth every 12 (twelve) hours. 04/13/19   Marylene Land, CNM  Doxylamine-Pyridoxine 10-10 MG TBEC Two tablets at bedtime on day 1 and 2; if symptoms persist, take 1 tablet in morning and 2 tablets at bedtime on day 3. 01/16/18   Marny Lowenstein, PA-C  fluconazole (DIFLUCAN) 150 MG tablet Take one tablet at the onset of symptoms, if still having symptoms in 3 days, take the second tablet. 10/29/19   Wieters, Hallie C, PA-C  naproxen (NAPROSYN) 500 MG tablet Take 1 tablet (500 mg total) by mouth 2 (two) times daily with a meal. 11/30/19   Maryna Yeagle, PA-C  ondansetron (ZOFRAN ODT) 8 MG  disintegrating tablet Take 1 tablet (8 mg total) by mouth every 8 (eight) hours as needed for nausea or vomiting. 04/13/19   Marylene Land, CNM  promethazine (PHENERGAN) 12.5 MG tablet Take 1 tablet (12.5 mg total) by mouth at bedtime. 04/13/19   Marylene Land, CNM    Allergies    Bee venom, Fire ant, and Amoxicillin  Review of Systems   Review of Systems  Gastrointestinal: Positive for abdominal pain.  Musculoskeletal: Positive for back pain.  All other systems reviewed and are negative.   Physical Exam Updated Vital Signs BP 117/74 (BP Location: Right Arm)   Pulse 79   Temp 98.7 F (37.1 C) (Oral)   Resp 18   Ht 5' (1.524 m)   Wt 56.2 kg   LMP 11/11/2019   SpO2 100%   BMI 24.22 kg/m   Physical Exam Vitals and nursing note reviewed. Exam conducted with a chaperone present.  Constitutional:      General: She is not in acute distress.    Appearance: She is well-developed.     Comments: Resting comfortably in the bed in no acute distress  HENT:     Head: Normocephalic and atraumatic.  Eyes:     Conjunctiva/sclera: Conjunctivae normal.     Pupils: Pupils are equal, round, and reactive to light.  Cardiovascular:     Rate and Rhythm: Normal rate and regular rhythm.     Pulses: Normal pulses.  Pulmonary:     Effort: Pulmonary effort is normal. No respiratory distress.     Breath sounds: Normal breath sounds. No wheezing.  Abdominal:     General: Bowel sounds are normal. There is no distension.     Palpations: Abdomen is soft. There is no mass.     Tenderness: There is no abdominal tenderness. There is no guarding or rebound.     Comments: No tenderness palpation the abdomen.  No rigidity, guarding, distention.  Negative rebound.  No peritonitis.  No significant bloating on my exam.  Genitourinary:    Vagina: Normal.     Cervix: Normal.     Uterus: Normal.      Adnexa: Right adnexa normal and left adnexa normal.     Comments: No discharge noted  on exam.  No CMT or adnexal tenderness.  No lesions.  No significant tenderness with pelvic exam. Musculoskeletal:        General: Tenderness present. Normal range of motion.     Cervical back: Normal range of motion and neck supple.     Comments: Tenderness palpation of bilateral  low back musculature.  No pain over midline spine.  No step-offs or deformities.  Ambulatory without difficulty.  Increased back pain with straight leg raise, but pain does not radiate.  Pedal pulses 2+ bilaterally.  No saddle paresthesias.  Skin:    General: Skin is warm and dry.     Capillary Refill: Capillary refill takes less than 2 seconds.  Neurological:     Mental Status: She is alert and oriented to person, place, and time.     ED Results / Procedures / Treatments   Labs (all labs ordered are listed, but only abnormal results are displayed) Labs Reviewed  COMPREHENSIVE METABOLIC PANEL - Abnormal; Notable for the following components:      Result Value   Glucose, Bld 123 (*)    Calcium 8.7 (*)    All other components within normal limits  CBC - Abnormal; Notable for the following components:   MCV 103.2 (*)    All other components within normal limits  LIPASE, BLOOD  URINALYSIS, ROUTINE W REFLEX MICROSCOPIC  PREGNANCY, URINE  GC/CHLAMYDIA PROBE AMP ([place]) NOT AT [facility]    EKG None  Radiology No results found.  Procedures Procedures (including critical care time)  Medications Ordered in ED Medications  naproxen (NAPROSYN) tablet 500 mg (500 mg Oral Given 11/30/19 1806)    ED Course  I have reviewed the triage vital signs and the nursing notes.  Pertinent labs & imaging results that were available during my care of the patient were reviewed by me and considered in my medical decision making (see chart for details).    MDM Rules/Calculators/A&P                          Patient presented for evaluation of lower abdominal pressure/bloating and low back pain.  On exam, patient  appears nontoxic.  No obvious abdominal pain.  She does have reproducible pain with palpation of the low back musculature.  She is in physical job, consider [employer] pain.  However she has had recent multiple diagnoses for STIs and other vaginal infections, will perform a pelvic exam for further evaluation.  Pelvic exam negative for CMT or adnexal tenderness.  No obvious or significant tenderness during the pelvic exam.  As such, doubt cervicitis.  Bloating could be due to recent abnormal period, hormones, consider fibroids.  Pregnancy and urine are negative.  Labs obtained from triage interpreted by me, overall reassuring.  Gonorrhea and Chlamydia tests are pending, however patient was not treated today despite recent exposure, due to patient request.  Discussed symptomatic treatment with NSAIDs, heating pads, and stretching.  Encourage follow-up with primary care and/or OB/GYN as needed.  This time, patient appears safe for discharge.  Return precautions given.  Patient states she understands and agrees to plan.   Final Clinical Impression(s) / ED Diagnoses Final diagnoses:  Acute bilateral low back pain without sciatica  Abdominal bloating    Rx / DC Orders ED Discharge Orders         Ordered    naproxen (NAPROSYN) 500 MG tablet  2 times daily with meals     Discontinue  Reprint     11/30/19 1855           Alveria Apley, PA-C 12/01/19 1455    Little, Ambrose [place], MD 12/02/19 1558

## 2019-11-30 NOTE — Discharge Instructions (Signed)
Follow-up with either the clinics listed below for further evaluation of your low abdominal bloating and irregular periods. Your testing for gonorrhea and chlamydia was sent.  If positive, you will see a phone call and you will need treatment.  If negative, you will not.  Either way, you may check online on MyChart. Take naproxen 2 times a day with meals.  Do not take other anti-inflammatories at the same time (Advil, Motrin, ibuprofen, Aleve). You may supplement with Tylenol if you need further pain control. Use heating pads to help control your pain. Return to the emergency room if you develop fevers, vomiting, loss of bowel bladder control, or any new, worsening, or concerning symptoms.

## 2019-12-02 LAB — GC/CHLAMYDIA PROBE AMP (~~LOC~~) NOT AT ARMC
Chlamydia: NEGATIVE
Comment: NEGATIVE
Comment: NORMAL
Neisseria Gonorrhea: NEGATIVE

## 2019-12-23 ENCOUNTER — Other Ambulatory Visit: Payer: Self-pay

## 2019-12-23 ENCOUNTER — Ambulatory Visit
Admission: EM | Admit: 2019-12-23 | Discharge: 2019-12-23 | Disposition: A | Discharge status: Home / Self Care | Payer: Medicaid Other | Attending: Physician Assistant | Admitting: Physician Assistant

## 2019-12-23 ENCOUNTER — Encounter: Payer: Self-pay | Admitting: Emergency Medicine

## 2019-12-23 DIAGNOSIS — N898 Other specified noninflammatory disorders of vagina: Secondary | ICD-10-CM

## 2019-12-23 MED ORDER — FLUCONAZOLE 150 MG PO TABS: 150.0000 mg | ORAL_TABLET | Freq: Every day | ORAL | 0 refills | Status: DC

## 2019-12-23 NOTE — ED Provider Notes (Signed)
[name]    CSN: [id number] Arrival date & time: 12/23/19  1438      History   Chief Complaint Chief Complaint  Patient presents with  . Vaginal Discharge    HPI Andrea Carson is a 24 y.o. female.   24 year old female comes in for 2 day history of vaginal discharge with irritation. Denies vaginal odor.  Denies urinary symptoms such as frequency, dysuria, hematuria. Denies fever, chills, body aches. Denies abdominal pain, nausea, vomiting. LMP 12/09/2019. Sexually active one female partner, no condom use. No birth control use. New detergent recently.      Past Medical History:  Diagnosis Date  . BV (bacterial vaginosis)   . Constipation   . Eczema   . Eczema   . Environmental allergies   . Gall stones   . Infection    UTI  . Seasonal allergies     Patient Active Problem List   Diagnosis Date Noted  . Cholelithiasis with acute cholecystitis 07/30/2014  . Indication for care in labor and delivery, antepartum 05/31/2014  . NSVD (normal spontaneous vaginal delivery) 05/31/2014    Past Surgical History:  Procedure Laterality Date  . ADENOIDECTOMY    . CHOLECYSTECTOMY N/A 07/31/2014   Procedure: LAPAROSCOPIC CHOLECYSTECTOMY WITH INTRAOPERATIVE CHOLANGIOGRAM;  Surgeon: Avel Peace, MD;  Location: [facility];  Service: General;  Laterality: N/A;  . THERAPEUTIC ABORTION     x3  . TONSILLECTOMY      OB History    Gravida  7   Para  1   Term  1   Preterm  0   AB  4   Living  1     SAB  0   TAB  4   Ectopic  0   Multiple  0   Live Births  1            Home Medications    Prior to Admission medications   Medication Sig Start Date End Date Taking? Authorizing Provider  Cetirizine HCl 10 MG CAPS Take 1 capsule (10 mg total) by mouth daily. 01/02/19   Wieters, Hallie C, PA-C  fluconazole (DIFLUCAN) 150 MG tablet Take 1 tablet (150 mg total) by mouth daily. Take second dose 72 hours later if symptoms still persists. 12/23/19   Cathie Hoops, Lysle Yero V,  PA-C  naproxen (NAPROSYN) 500 MG tablet Take 1 tablet (500 mg total) by mouth 2 (two) times daily with a meal. 11/30/19   Caccavale, Sophia, PA-C  promethazine (PHENERGAN) 12.5 MG tablet Take 1 tablet (12.5 mg total) by mouth at bedtime. 04/13/19 12/23/19  Marylene Land, CNM    Family History Family History  Problem Relation Age of Onset  . Hypertension Maternal Grandmother   . Cancer Maternal Grandmother   . Hypertension Mother   . Cancer Maternal Aunt   . Anxiety disorder Maternal Uncle   . Diabetes Paternal Grandmother     Social History Social History   Tobacco Use  . Smoking status: Never Smoker  . Smokeless tobacco: Never Used  Vaping Use  . Vaping Use: Former  Substance Use Topics  . Alcohol use: No  . Drug use: Yes    Types: Marijuana    Comment: daily      Allergies   Bee venom, Fire ant, and Amoxicillin   Review of Systems Review of Systems  Reason unable to perform ROS: See HPI as above.     Physical Exam Triage Vital Signs ED Triage Vitals  Enc Vitals Group  BP 12/23/19 1511 134/83     Pulse Rate 12/23/19 1511 66     Resp 12/23/19 1511 16     Temp 12/23/19 1511 99.2 F (37.3 C)     Temp Source 12/23/19 1511 Oral     SpO2 12/23/19 1511 98 %     Weight --      Height --      Head Circumference --      Peak Flow --      Pain Score 12/23/19 1509 8     Pain Loc --      Pain Edu? --      Excl. in GC? --    No data found.  Updated Vital Signs BP 134/83 (BP Location: Left Arm)   Pulse 66   Temp 99.2 F (37.3 C) (Oral)   Resp 16   LMP 12/09/2019   SpO2 98%   Physical Exam Constitutional:      General: She is not in acute distress.    Appearance: Normal appearance. She is well-developed. She is not toxic-appearing or diaphoretic.  HENT:     Head: Normocephalic and atraumatic.  Eyes:     Conjunctiva/sclera: Conjunctivae normal.     Pupils: Pupils are equal, round, and reactive to light.  Pulmonary:     Effort: Pulmonary  effort is normal. No respiratory distress.  Abdominal:     General: Bowel sounds are normal.     Palpations: Abdomen is soft.     Tenderness: There is no abdominal tenderness. There is no guarding or rebound.  Musculoskeletal:     Cervical back: Normal range of motion and neck supple.  Skin:    General: Skin is warm and dry.  Neurological:     Mental Status: She is alert and oriented to person, place, and time.      UC Treatments / Results  Labs (all labs ordered are listed, but only abnormal results are displayed) Labs Reviewed  CERVICOVAGINAL ANCILLARY ONLY    EKG   Radiology No results found.  Procedures Procedures (including critical care time)  Medications Ordered in UC Medications - No data to display  Initial Impression / Assessment and Plan / UC Course  I have reviewed the triage vital signs and the nursing notes.  Pertinent labs & imaging results that were available during my care of the patient were reviewed by me and considered in my medical decision making (see chart for details).    Patient was treated empirically for yeast. Diflucan as directed. Cytology sent, patient will be contacted with any positive results that require additional treatment. Patient to refrain from sexual activity for the next 7 days. Return precautions given.   Final Clinical Impressions(s) / UC Diagnoses   Final diagnoses:  Vaginal discharge    ED Prescriptions    Medication Sig Dispense Auth. Provider   fluconazole (DIFLUCAN) 150 MG tablet Take 1 tablet (150 mg total) by mouth daily. Take second dose 72 hours later if symptoms still persists. 2 tablet Belinda Fisher, PA-C     PDMP not reviewed this encounter.   Belinda Fisher, PA-C 12/23/19 1537

## 2019-12-23 NOTE — Discharge Instructions (Signed)
You were treated empirically for yeast. Diflucan as directed. Avoid new hygiene product. Cytology sent, you will be contacted with any positive results that requires further treatment. Refrain from sexual activity and alcohol use for the next 7 days. Monitor for any worsening of symptoms, fever, abdominal pain, nausea, vomiting, to follow up for reevaluation.

## 2019-12-23 NOTE — ED Triage Notes (Signed)
Pt presents to [facility] for assessment of vaginal discharge x 2 days.

## 2019-12-23 NOTE — ED Notes (Signed)
Patient able to ambulate independently  

## 2019-12-24 LAB — CERVICOVAGINAL ANCILLARY ONLY
Chlamydia: NEGATIVE
Comment: NEGATIVE
Comment: NEGATIVE
Comment: NORMAL
Neisseria Gonorrhea: NEGATIVE
Trichomonas: NEGATIVE

## 2020-01-07 ENCOUNTER — Inpatient Hospital Stay (HOSPITAL_COMMUNITY): Payer: Medicaid Other

## 2020-01-07 ENCOUNTER — Inpatient Hospital Stay (HOSPITAL_COMMUNITY)
Admission: AD | Admit: 2020-01-07 | Discharge: 2020-01-07 | Disposition: A | Discharge status: Home / Self Care | Payer: Medicaid Other | Attending: Family Medicine | Admitting: Family Medicine

## 2020-01-07 ENCOUNTER — Encounter (HOSPITAL_COMMUNITY): Payer: Self-pay | Admitting: Family Medicine

## 2020-01-07 ENCOUNTER — Other Ambulatory Visit: Payer: Self-pay

## 2020-01-07 DIAGNOSIS — O23591 Infection of other part of genital tract in pregnancy, first trimester: Secondary | ICD-10-CM | POA: Diagnosis not present

## 2020-01-07 DIAGNOSIS — Z880 Allergy status to penicillin: Secondary | ICD-10-CM | POA: Insufficient documentation

## 2020-01-07 DIAGNOSIS — Z3A01 Less than 8 weeks gestation of pregnancy: Secondary | ICD-10-CM

## 2020-01-07 DIAGNOSIS — N760 Acute vaginitis: Secondary | ICD-10-CM | POA: Diagnosis not present

## 2020-01-07 DIAGNOSIS — Z87440 Personal history of urinary (tract) infections: Secondary | ICD-10-CM | POA: Diagnosis not present

## 2020-01-07 DIAGNOSIS — Z9049 Acquired absence of other specified parts of digestive tract: Secondary | ICD-10-CM | POA: Insufficient documentation

## 2020-01-07 DIAGNOSIS — O3680X0 Pregnancy with inconclusive fetal viability, not applicable or unspecified: Secondary | ICD-10-CM | POA: Insufficient documentation

## 2020-01-07 DIAGNOSIS — O020 Blighted ovum and nonhydatidiform mole: Secondary | ICD-10-CM | POA: Diagnosis not present

## 2020-01-07 DIAGNOSIS — Z3A00 Weeks of gestation of pregnancy not specified: Secondary | ICD-10-CM | POA: Diagnosis not present

## 2020-01-07 DIAGNOSIS — F1290 Cannabis use, unspecified, uncomplicated: Secondary | ICD-10-CM | POA: Insufficient documentation

## 2020-01-07 DIAGNOSIS — R109 Unspecified abdominal pain: Secondary | ICD-10-CM | POA: Diagnosis not present

## 2020-01-07 DIAGNOSIS — O26891 Other specified pregnancy related conditions, first trimester: Secondary | ICD-10-CM

## 2020-01-07 DIAGNOSIS — B9689 Other specified bacterial agents as the cause of diseases classified elsewhere: Secondary | ICD-10-CM | POA: Diagnosis not present

## 2020-01-07 DIAGNOSIS — O26899 Other specified pregnancy related conditions, unspecified trimester: Secondary | ICD-10-CM | POA: Diagnosis not present

## 2020-01-07 DIAGNOSIS — O99321 Drug use complicating pregnancy, first trimester: Secondary | ICD-10-CM | POA: Insufficient documentation

## 2020-01-07 LAB — CBC
HCT: 36.2 % (ref 36.0–46.0)
Hemoglobin: 12.3 g/dL (ref 12.0–15.0)
MCH: 32.5 pg (ref 26.0–34.0)
MCHC: 34 g/dL (ref 30.0–36.0)
MCV: 95.8 fL (ref 80.0–100.0)
Platelets: 232 10*3/uL (ref 150–400)
RBC: 3.78 MIL/uL — ABNORMAL LOW (ref 3.87–5.11)
RDW: 13.2 % (ref 11.5–15.5)
WBC: 6.1 10*3/uL (ref 4.0–10.5)
nRBC: 0 % (ref 0.0–0.2)

## 2020-01-07 LAB — WET PREP, GENITAL
Sperm: NONE SEEN
Trich, Wet Prep: NONE SEEN
Yeast Wet Prep HPF POC: NONE SEEN

## 2020-01-07 LAB — POCT PREGNANCY, URINE: Preg Test, Ur: POSITIVE — AB

## 2020-01-07 LAB — HCG, QUANTITATIVE, PREGNANCY: hCG, Beta Chain, Quant, S: 207 m[IU]/mL — ABNORMAL HIGH (ref <5)

## 2020-01-07 MED ORDER — METRONIDAZOLE 500 MG PO TABS
500.0000 mg | ORAL_TABLET | Freq: Two times a day (BID) | ORAL | 0 refills | Status: DC
Start: 2020-01-07 — End: 2020-08-03

## 2020-01-07 NOTE — MAU Note (Signed)
Pt given AVS. Pt did not sign AVS signature pad

## 2020-01-07 NOTE — MAU Note (Signed)
PT SAYS HAD TAB ON 05-2019- THAT IS 44 WEEKS PREG. .   THEN SHE WENT TO URGENT CARE ON 7-19 FOR YEAST INFECTION .  DID HPT ON Friday - POSITIVE.  NO BC.    SAYS HER LOWER BACK STARTED HURTING  ON Sunday , FEELS CRAMPING TODAY  AND WHITE D/C .

## 2020-01-07 NOTE — Discharge Instructions (Signed)
Abdominal Pain During Pregnancy  Belly (abdominal) pain is common during pregnancy. There are many possible causes. Most of the time, it is not a serious problem. Other times, it can be a sign that something is wrong with the pregnancy. Always tell your doctor if you have belly pain. Follow these instructions at home:  Do not have sex or put anything in your vagina until your pain goes away completely.  Get plenty of rest until your pain gets better.  Drink enough fluid to keep your pee (urine) pale yellow.  Take over-the-counter and prescription medicines only as told by your doctor.  Keep all follow-up visits as told by your doctor. This is important. Contact a doctor if:  Your pain continues or gets worse after resting.  You have lower belly pain that: ? Comes and goes at regular times. ? Spreads to your back. ? Feels like menstrual cramps.  You have pain or burning when you pee (urinate). Get help right away if:  You have a fever or chills.  You have vaginal bleeding.  You are leaking fluid from your vagina.  You are passing tissue from your vagina.  You throw up (vomit) for more than 24 hours.  You have watery poop (diarrhea) for more than 24 hours.  Your baby is moving less than usual.  You feel very weak or faint.  You have shortness of breath.  You have very bad pain in your upper belly. Summary  Belly (abdominal) pain is common during pregnancy. There are many possible causes.  If you have belly pain during pregnancy, tell your doctor right away.  Keep all follow-up visits as told by your doctor. This is important. This information is not intended to replace advice given to you by your health care provider. Make sure you discuss any questions you have with your health care provider. Document Revised: 09/10/2018 Document Reviewed: 08/25/2016 Elsevier Patient Education  2020 Elsevier Inc.  

## 2020-01-07 NOTE — MAU Provider Note (Signed)
History     CSN: [id number]  Arrival date and time: 01/07/20 2000   First Provider Initiated Contact with Patient 01/07/20 2053      Chief Complaint  Patient presents with  . Abdominal Pain   Andrea Carson is a 24 y.o. G7P1 at [redacted]w[redacted]d by LMP who presents to MAU with complaints of abdominal pain. Patient reports finding out she was pregnant on Friday by HPT. Started having abdominal pain and back pain on Sunday. She describes the abdominal pain as intermittent lower abdominal cramping that is more specific to middle and radiates to left around to back. Has not taken any medication for pain, rates pain 2-3/10 currently. She reports having some white thick discharge, was seen recently at urgent care and treated for yeast infection. She denies itching or irritation. She denies vaginal bleeding, nausea, emesis, or urinary symptoms.    OB History    Gravida  7   Para  1   Term  1   Preterm  0   AB  5   Living  1     SAB  0   TAB  5   Ectopic  0   Multiple  0   Live Births  1           Past Medical History:  Diagnosis Date  . BV (bacterial vaginosis)   . Constipation   . Eczema   . Eczema   . Environmental allergies   . Gall stones   . Infection    UTI  . Seasonal allergies     Past Surgical History:  Procedure Laterality Date  . ADENOIDECTOMY    . CHOLECYSTECTOMY N/A 07/31/2014   Procedure: LAPAROSCOPIC CHOLECYSTECTOMY WITH INTRAOPERATIVE CHOLANGIOGRAM;  Surgeon: Avel Peace, MD;  Location: [facility];  Service: General;  Laterality: N/A;  . THERAPEUTIC ABORTION     x3  . TONSILLECTOMY      Family History  Problem Relation Age of Onset  . Hypertension Maternal Grandmother   . Cancer Maternal Grandmother   . Hypertension Mother   . Cancer Maternal Aunt   . Anxiety disorder Maternal Uncle   . Diabetes Paternal Grandmother     Social History   Tobacco Use  . Smoking status: Never Smoker  . Smokeless tobacco: Never Used  Vaping Use  . Vaping Use:  Former  Substance Use Topics  . Alcohol use: Not Currently  . Drug use: Yes    Types: Marijuana    Comment: daily     Allergies:  Allergies  Allergen Reactions  . Bee Venom Anaphylaxis and Other (See Comments)    Pt has an epipen  . Fire [employer] Anaphylaxis  . Amoxicillin Rash    CHILDHOOD ALLERGY Has patient had a PCN reaction causing immediate rash, facial/tongue/throat swelling, SOB or lightheadedness with hypotension: Yes Has patient had a PCN reaction causing severe rash involving mucus membranes or skin necrosis: No Has patient had a PCN reaction that required hospitalization No Has patient had a PCN reaction occurring within the last 10 years: No  If all of the above answers are "NO", then may proceed with Cephalosporin use.     No medications prior to admission.    Review of Systems  Constitutional: Negative.   Respiratory: Negative.   Cardiovascular: Negative.   Gastrointestinal: Positive for abdominal pain. Negative for constipation, diarrhea, nausea and vomiting.  Genitourinary: Negative.   Musculoskeletal: Positive for back pain.  Neurological: Negative.   Psychiatric/Behavioral: Negative.    Physical Exam  Blood pressure (!) 114/55, pulse 90, temperature 99.4 F (37.4 C), temperature source Oral, resp. rate 20, height 5' (1.524 m), weight 57.7 kg, last menstrual period 12/09/2019.  Physical Exam Vitals reviewed.  Constitutional:      Appearance: She is normal weight.  HENT:     Head: Normocephalic.  Cardiovascular:     Rate and Rhythm: Normal rate and regular rhythm.  Pulmonary:     Effort: Pulmonary effort is normal. No respiratory distress.     Breath sounds: Normal breath sounds. No wheezing.  Abdominal:     General: There is no distension.     Palpations: Abdomen is soft. There is no mass.     Tenderness: There is no abdominal tenderness. There is no guarding.  Genitourinary:    Comments: Bimanual exam: Cervix 0/long/high, firm, anterior, neg  CMT, uterus nontender, nonenlarged, adnexa without tenderness, enlargement, or mass Skin:    General: Skin is warm and dry.  Neurological:     Mental Status: She is alert and oriented to person, place, and time.  Psychiatric:        Mood and Affect: Mood normal.        Behavior: Behavior normal.        Thought Content: Thought content normal.    MAU Course  Procedures  MDM Orders Placed This Encounter  Procedures  . Wet prep, genital  . US OB LESS THAN 14 WEEKS WITH OB TRANSVAGINAL  . CBC  . hCG, quantitative, pregnancy  . Pregnancy, urine POC   Labs and [place] report reviewed:  Results for orders placed or performed during the hospital encounter of 01/07/20 (from the past 24 hour(s))  CBC     Status: Abnormal   Collection Time: 01/07/20  8:49 PM  Result Value Ref Range   WBC 6.1 4.0 - 10.5 K/uL   RBC 3.78 (L) 3.87 - 5.11 MIL/uL   Hemoglobin 12.3 12.0 - 15.0 g/dL   HCT 81.1 36 - 46 %   MCV 95.8 80.0 - 100.0 fL   MCH 32.5 26.0 - 34.0 pg   MCHC 34.0 30.0 - 36.0 g/dL   RDW 57.2 62.0 - 35.5 %   Platelets 232 150 - 400 K/uL   nRBC 0.0 0.0 - 0.2 %  hCG, quantitative, pregnancy     Status: Abnormal   Collection Time: 01/07/20  8:49 PM  Result Value Ref Range   hCG, Beta Chain, Quant, S 207 (H) <5 mIU/mL  Pregnancy, urine POC     Status: Abnormal   Collection Time: 01/07/20  8:55 PM  Result Value Ref Range   Preg Test, Ur POSITIVE (A) NEGATIVE  Wet prep, genital     Status: Abnormal   Collection Time: 01/07/20  9:03 PM  Result Value Ref Range   Yeast Wet Prep HPF POC NONE SEEN NONE SEEN   Trich, Wet Prep NONE SEEN NONE SEEN   Clue Cells Wet Prep HPF POC PRESENT (A) NONE SEEN   WBC, Wet Prep HPF POC FEW (A) NONE SEEN   Sperm NONE SEEN    US OB LESS THAN 14 WEEKS WITH OB TRANSVAGINAL  Result Date: 01/07/2020 CLINICAL DATA:  Abdominal pain, quantitative hCG pending EXAM: OBSTETRIC <14 WK [place] AND TRANSVAGINAL OB US TECHNIQUE: Both transabdominal and transvaginal ultrasound  examinations were performed for complete evaluation of the gestation as well as the maternal uterus, adnexal regions, and pelvic cul-de-sac. Transvaginal technique was performed to assess early pregnancy. COMPARISON:  Prior gestational ultrasound 04/11/2018 FINDINGS: Intrauterine  gestational sac: Small amount of lobular fluid within the fundal endometrium. Yolk sac:  Not Visualized. Embryo:  Not Visualized. Cardiac Activity: Not Visualized. Maternal uterus/adnexae: Anteverted uterus. Ovaries are unremarkable. No focal adnexal lesions. Small amount of anechoic free fluid in the pelvis. IMPRESSION: Small amount of fluid within the fundal endometrium, nonspecific. No discernible intrauterine pregnancy visualized. Differential considerations would include early intrauterine pregnancy, spontaneous abortion, or occult ectopic pregnancy. Recommend close clinical followup and serial quantitative beta HCGs and ultrasounds. Electronically Signed   By: Kreg Shropshire M.D.   On: 01/07/2020 21:31   Discussed results of [place] and lab work with patient. GS seen but no yolk sac, discussed importance of follow up HCG level to assess viability and [place] in 2 weeks. Appointment made for patient to follow up in the office on Friday for stat HCG. Treatment for BV will be initiated based on clinical symptoms and presence of clue cells. Rx for flagyl sent to pharmacy of choice.   Discussed reasons to return to MAU. Follow up as scheduled in the office. Return to MAU as needed. Pt stable at time of discharge.   Assessment and Plan   1. Empty gestational sac with ongoing pregnancy   2. Abdominal pain during pregnancy in first trimester   3. Bacterial vaginosis    Discharge home Follow up as scheduled in the office for stat HCG on Friday  Return to MAU as needed for reasons discussed and/or emergencies  Rx for flagyl    Follow-up Information    [facility]. Go on 01/10/2020.    Specialty: Obstetrics and Gynecology Why: Go to appointment on Friday for repeat lab work  Contact information: [address] [place] [address] [phone]             Allergies as of 01/07/2020      Reactions   Bee Venom Anaphylaxis, Other (See Comments)   Pt has an epipen   Animator Anaphylaxis   Amoxicillin Rash   CHILDHOOD ALLERGY Has patient had a PCN reaction causing immediate rash, facial/tongue/throat swelling, SOB or lightheadedness with hypotension: Yes Has patient had a PCN reaction causing severe rash involving mucus membranes or skin necrosis: No Has patient had a PCN reaction that required hospitalization No Has patient had a PCN reaction occurring within the last 10 years: No  If all of the above answers are "NO", then may proceed with Cephalosporin use.      Medication List    STOP taking these medications   fluconazole 150 MG tablet Commonly known as: Diflucan   naproxen 500 MG tablet Commonly known as: NAPROSYN     TAKE these medications   Cetirizine HCl 10 MG Caps Take 1 capsule (10 mg total) by mouth daily.   metroNIDAZOLE 500 MG tablet Commonly known as: FLAGYL Take 1 tablet (500 mg total) by mouth 2 (two) times daily.       Sharyon Cable CNM 01/07/2020, 10:15 PM

## 2020-01-08 ENCOUNTER — Encounter: Payer: Self-pay | Admitting: Certified Nurse Midwife

## 2020-01-08 LAB — GC/CHLAMYDIA PROBE AMP (~~LOC~~) NOT AT ARMC
Chlamydia: NEGATIVE
Comment: NEGATIVE
Comment: NORMAL
Neisseria Gonorrhea: NEGATIVE

## 2020-01-10 ENCOUNTER — Ambulatory Visit: Payer: Medicaid Other

## 2020-01-20 DIAGNOSIS — O219 Vomiting of pregnancy, unspecified: Secondary | ICD-10-CM | POA: Diagnosis not present

## 2020-01-20 DIAGNOSIS — E876 Hypokalemia: Secondary | ICD-10-CM | POA: Diagnosis not present

## 2020-01-20 DIAGNOSIS — Z3A01 Less than 8 weeks gestation of pregnancy: Secondary | ICD-10-CM | POA: Diagnosis not present

## 2020-01-20 DIAGNOSIS — O99281 Endocrine, nutritional and metabolic diseases complicating pregnancy, first trimester: Secondary | ICD-10-CM | POA: Diagnosis not present

## 2020-01-21 DIAGNOSIS — O219 Vomiting of pregnancy, unspecified: Secondary | ICD-10-CM | POA: Diagnosis not present

## 2020-01-21 DIAGNOSIS — R519 Headache, unspecified: Secondary | ICD-10-CM | POA: Diagnosis not present

## 2020-01-21 DIAGNOSIS — O99891 Other specified diseases and conditions complicating pregnancy: Secondary | ICD-10-CM | POA: Diagnosis not present

## 2020-01-21 DIAGNOSIS — E876 Hypokalemia: Secondary | ICD-10-CM | POA: Diagnosis not present

## 2020-01-21 DIAGNOSIS — Z3A01 Less than 8 weeks gestation of pregnancy: Secondary | ICD-10-CM | POA: Diagnosis not present

## 2020-01-21 DIAGNOSIS — O99281 Endocrine, nutritional and metabolic diseases complicating pregnancy, first trimester: Secondary | ICD-10-CM | POA: Diagnosis not present

## 2020-02-18 DIAGNOSIS — Z3481 Encounter for supervision of other normal pregnancy, first trimester: Secondary | ICD-10-CM | POA: Diagnosis not present

## 2020-02-18 DIAGNOSIS — Z113 Encounter for screening for infections with a predominantly sexual mode of transmission: Secondary | ICD-10-CM | POA: Diagnosis not present

## 2020-02-18 DIAGNOSIS — Z3201 Encounter for pregnancy test, result positive: Secondary | ICD-10-CM | POA: Diagnosis not present

## 2020-02-19 DIAGNOSIS — Z3481 Encounter for supervision of other normal pregnancy, first trimester: Secondary | ICD-10-CM | POA: Diagnosis not present

## 2020-03-09 DIAGNOSIS — Z202 Contact with and (suspected) exposure to infections with a predominantly sexual mode of transmission: Secondary | ICD-10-CM | POA: Diagnosis not present

## 2020-03-12 DIAGNOSIS — Z3687 Encounter for antenatal screening for uncertain dates: Secondary | ICD-10-CM | POA: Diagnosis not present

## 2020-03-31 DIAGNOSIS — Z3A00 Weeks of gestation of pregnancy not specified: Secondary | ICD-10-CM | POA: Diagnosis not present

## 2020-03-31 DIAGNOSIS — O210 Mild hyperemesis gravidarum: Secondary | ICD-10-CM | POA: Diagnosis not present

## 2020-03-31 DIAGNOSIS — Z3482 Encounter for supervision of other normal pregnancy, second trimester: Secondary | ICD-10-CM | POA: Diagnosis not present

## 2020-03-31 DIAGNOSIS — R112 Nausea with vomiting, unspecified: Secondary | ICD-10-CM | POA: Diagnosis not present

## 2020-04-02 DIAGNOSIS — O210 Mild hyperemesis gravidarum: Secondary | ICD-10-CM | POA: Diagnosis not present

## 2020-04-02 DIAGNOSIS — Z3A16 16 weeks gestation of pregnancy: Secondary | ICD-10-CM | POA: Diagnosis not present

## 2020-04-02 DIAGNOSIS — Z880 Allergy status to penicillin: Secondary | ICD-10-CM | POA: Diagnosis not present

## 2020-04-02 DIAGNOSIS — O211 Hyperemesis gravidarum with metabolic disturbance: Secondary | ICD-10-CM | POA: Diagnosis not present

## 2020-04-26 DIAGNOSIS — O219 Vomiting of pregnancy, unspecified: Secondary | ICD-10-CM | POA: Diagnosis not present

## 2020-04-26 DIAGNOSIS — O210 Mild hyperemesis gravidarum: Secondary | ICD-10-CM | POA: Diagnosis not present

## 2020-04-26 DIAGNOSIS — Z3A19 19 weeks gestation of pregnancy: Secondary | ICD-10-CM | POA: Diagnosis not present

## 2020-04-26 DIAGNOSIS — E860 Dehydration: Secondary | ICD-10-CM | POA: Diagnosis not present

## 2020-04-26 DIAGNOSIS — O99891 Other specified diseases and conditions complicating pregnancy: Secondary | ICD-10-CM | POA: Diagnosis not present

## 2020-04-26 DIAGNOSIS — R197 Diarrhea, unspecified: Secondary | ICD-10-CM | POA: Diagnosis not present

## 2020-04-26 DIAGNOSIS — Z6822 Body mass index (BMI) 22.0-22.9, adult: Secondary | ICD-10-CM | POA: Diagnosis not present

## 2020-04-26 DIAGNOSIS — O99282 Endocrine, nutritional and metabolic diseases complicating pregnancy, second trimester: Secondary | ICD-10-CM | POA: Diagnosis not present

## 2020-04-26 DIAGNOSIS — R630 Anorexia: Secondary | ICD-10-CM | POA: Diagnosis not present

## 2020-05-04 DIAGNOSIS — R102 Pelvic and perineal pain: Secondary | ICD-10-CM | POA: Diagnosis not present

## 2020-05-07 DIAGNOSIS — Z363 Encounter for antenatal screening for malformations: Secondary | ICD-10-CM | POA: Diagnosis not present

## 2020-05-11 DIAGNOSIS — Z3482 Encounter for supervision of other normal pregnancy, second trimester: Secondary | ICD-10-CM | POA: Diagnosis not present

## 2020-05-11 DIAGNOSIS — N898 Other specified noninflammatory disorders of vagina: Secondary | ICD-10-CM | POA: Diagnosis not present

## 2020-05-11 DIAGNOSIS — Z3A22 22 weeks gestation of pregnancy: Secondary | ICD-10-CM | POA: Diagnosis not present

## 2020-05-11 DIAGNOSIS — Z5320 Procedure and treatment not carried out because of patient's decision for unspecified reasons: Secondary | ICD-10-CM | POA: Diagnosis not present

## 2020-05-11 DIAGNOSIS — R102 Pelvic and perineal pain: Secondary | ICD-10-CM | POA: Diagnosis not present

## 2020-05-11 DIAGNOSIS — O26899 Other specified pregnancy related conditions, unspecified trimester: Secondary | ICD-10-CM | POA: Diagnosis not present

## 2020-05-21 DIAGNOSIS — O2692 Pregnancy related conditions, unspecified, second trimester: Secondary | ICD-10-CM | POA: Diagnosis not present

## 2020-05-28 DIAGNOSIS — O26892 Other specified pregnancy related conditions, second trimester: Secondary | ICD-10-CM | POA: Diagnosis not present

## 2020-05-28 DIAGNOSIS — Z3A24 24 weeks gestation of pregnancy: Secondary | ICD-10-CM | POA: Diagnosis not present

## 2020-05-28 DIAGNOSIS — Z3482 Encounter for supervision of other normal pregnancy, second trimester: Secondary | ICD-10-CM | POA: Diagnosis not present

## 2020-05-28 DIAGNOSIS — R109 Unspecified abdominal pain: Secondary | ICD-10-CM | POA: Diagnosis not present

## 2020-06-06 NOTE — L&D Delivery Note (Signed)
Delivery Note Called to bedside and patient complete and pushing. At 11:05 PM a viable female was delivered via Vaginal, Spontaneous (Presentation: Middle Occiput Anterior).  APGAR: 9, 9; weight pending.   Placenta status: Spontaneous, Intact.  Cord: 3 vessels with the following complications: None.  Cord pH: pending, arterial blood gas sent given FHT.  Anesthesia: Epidural Episiotomy: None Lacerations: None Est. Blood Loss (mL): 50  Mom to postpartum.  Baby to Couplet care / Skin to Skin.  Alric Seton 09/14/2020, 11:16 PM

## 2020-06-10 DIAGNOSIS — Z209 Contact with and (suspected) exposure to unspecified communicable disease: Secondary | ICD-10-CM | POA: Diagnosis not present

## 2020-06-10 DIAGNOSIS — Z712 Person consulting for explanation of examination or test findings: Secondary | ICD-10-CM | POA: Diagnosis not present

## 2020-06-15 DIAGNOSIS — Z209 Contact with and (suspected) exposure to unspecified communicable disease: Secondary | ICD-10-CM | POA: Diagnosis not present

## 2020-06-15 DIAGNOSIS — Z3483 Encounter for supervision of other normal pregnancy, third trimester: Secondary | ICD-10-CM | POA: Diagnosis not present

## 2020-06-29 DIAGNOSIS — Z3483 Encounter for supervision of other normal pregnancy, third trimester: Secondary | ICD-10-CM | POA: Diagnosis not present

## 2020-06-29 DIAGNOSIS — Z209 Contact with and (suspected) exposure to unspecified communicable disease: Secondary | ICD-10-CM | POA: Diagnosis not present

## 2020-07-07 DIAGNOSIS — Z23 Encounter for immunization: Secondary | ICD-10-CM | POA: Diagnosis not present

## 2020-07-07 DIAGNOSIS — Z3483 Encounter for supervision of other normal pregnancy, third trimester: Secondary | ICD-10-CM | POA: Diagnosis not present

## 2020-07-19 ENCOUNTER — Other Ambulatory Visit: Payer: Self-pay

## 2020-07-19 ENCOUNTER — Encounter (HOSPITAL_COMMUNITY): Payer: Self-pay | Admitting: Family Medicine

## 2020-07-19 ENCOUNTER — Inpatient Hospital Stay (HOSPITAL_COMMUNITY)
Admission: AD | Admit: 2020-07-19 | Discharge: 2020-07-19 | Disposition: A | Discharge status: Home / Self Care | Payer: Medicaid Other | Attending: Family Medicine | Admitting: Family Medicine

## 2020-07-19 DIAGNOSIS — R102 Pelvic and perineal pain: Secondary | ICD-10-CM | POA: Insufficient documentation

## 2020-07-19 DIAGNOSIS — M545 Low back pain, unspecified: Secondary | ICD-10-CM

## 2020-07-19 DIAGNOSIS — B373 Candidiasis of vulva and vagina: Secondary | ICD-10-CM | POA: Diagnosis not present

## 2020-07-19 DIAGNOSIS — O99891 Other specified diseases and conditions complicating pregnancy: Secondary | ICD-10-CM

## 2020-07-19 DIAGNOSIS — O26899 Other specified pregnancy related conditions, unspecified trimester: Secondary | ICD-10-CM

## 2020-07-19 DIAGNOSIS — Z3A31 31 weeks gestation of pregnancy: Secondary | ICD-10-CM | POA: Insufficient documentation

## 2020-07-19 DIAGNOSIS — O163 Unspecified maternal hypertension, third trimester: Secondary | ICD-10-CM | POA: Insufficient documentation

## 2020-07-19 DIAGNOSIS — B3731 Acute candidiasis of vulva and vagina: Secondary | ICD-10-CM

## 2020-07-19 DIAGNOSIS — R030 Elevated blood-pressure reading, without diagnosis of hypertension: Secondary | ICD-10-CM | POA: Diagnosis not present

## 2020-07-19 DIAGNOSIS — O98813 Other maternal infectious and parasitic diseases complicating pregnancy, third trimester: Secondary | ICD-10-CM | POA: Insufficient documentation

## 2020-07-19 DIAGNOSIS — O26893 Other specified pregnancy related conditions, third trimester: Secondary | ICD-10-CM

## 2020-07-19 DIAGNOSIS — O169 Unspecified maternal hypertension, unspecified trimester: Secondary | ICD-10-CM

## 2020-07-19 HISTORY — DX: Cardiac murmur, unspecified: R01.1

## 2020-07-19 LAB — CBC
HCT: 31.4 % — ABNORMAL LOW (ref 36.0–46.0)
Hemoglobin: 10.8 g/dL — ABNORMAL LOW (ref 12.0–15.0)
MCH: 32.5 pg (ref 26.0–34.0)
MCHC: 34.4 g/dL (ref 30.0–36.0)
MCV: 94.6 fL (ref 80.0–100.0)
Platelets: 180 10*3/uL (ref 150–400)
RBC: 3.32 MIL/uL — ABNORMAL LOW (ref 3.87–5.11)
RDW: 12.6 % (ref 11.5–15.5)
WBC: 9.1 10*3/uL (ref 4.0–10.5)
nRBC: 0 % (ref 0.0–0.2)

## 2020-07-19 LAB — URINALYSIS, ROUTINE W REFLEX MICROSCOPIC
Bilirubin Urine: NEGATIVE
Glucose, UA: 150 mg/dL — AB
Hgb urine dipstick: NEGATIVE
Ketones, ur: NEGATIVE mg/dL
Leukocytes,Ua: NEGATIVE
Nitrite: NEGATIVE
Protein, ur: NEGATIVE mg/dL
Specific Gravity, Urine: 1.023 (ref 1.005–1.030)
pH: 7 (ref 5.0–8.0)

## 2020-07-19 LAB — COMPREHENSIVE METABOLIC PANEL
ALT: 12 U/L (ref 0–44)
AST: 19 U/L (ref 15–41)
Albumin: 2.9 g/dL — ABNORMAL LOW (ref 3.5–5.0)
Alkaline Phosphatase: 81 U/L (ref 38–126)
Anion gap: 15 (ref 5–15)
BUN: 8 mg/dL (ref 6–20)
CO2: 20 mmol/L — ABNORMAL LOW (ref 22–32)
Calcium: 9.1 mg/dL (ref 8.9–10.3)
Chloride: 105 mmol/L (ref 98–111)
Creatinine, Ser: 0.67 mg/dL (ref 0.44–1.00)
GFR, Estimated: >60 mL/min (ref >60)
Glucose, Bld: 111 mg/dL — ABNORMAL HIGH (ref 70–99)
Potassium: 3.7 mmol/L (ref 3.5–5.1)
Sodium: 140 mmol/L (ref 135–145)
Total Bilirubin: 0.5 mg/dL (ref 0.3–1.2)
Total Protein: 5.7 g/dL — ABNORMAL LOW (ref 6.5–8.1)

## 2020-07-19 LAB — PROTEIN / CREATININE RATIO, URINE
Creatinine, Urine: 147.15 mg/dL
Protein Creatinine Ratio: 0.1 mg/mg{Cre} (ref 0.00–0.15)
Total Protein, Urine: 15 mg/dL

## 2020-07-19 MED ORDER — FLUCONAZOLE 150 MG PO TABS
150.0000 mg | ORAL_TABLET | Freq: Every day | ORAL | 0 refills | Status: DC
Start: 2020-07-19 — End: 2020-08-03

## 2020-07-19 NOTE — Discharge Instructions (Signed)

## 2020-07-19 NOTE — MAU Provider Note (Signed)
History     CSN: [id number]  Arrival date and time: 07/19/20 1441   Event Date/Time   First Provider Initiated Contact with Patient 07/19/20 1524      Chief Complaint  Patient presents with  . Back Pain  . Pelvic Pain   HPI Andrea Carson is a 25 y.o. [id number] at [redacted]w[redacted]d who presents with multiple complaints. She reports lower back pain that has been ongoing for several weeks. It has not changed and is not worse than it has been. She also reports pelvic pain for the last week that is worse with movement and changing positions. She reports her vagina burns when she wipes. She denies any abdominal pain, vaginal bleeding or discharge. Reports normal fetal movement. She gets care in [facility] and denies any problems in the pregnancy.   OB History    Gravida  7   Para  1   Term  1   Preterm  0   AB  5   Living  1     SAB  0   IAB  5   Ectopic  0   Multiple  0   Live Births  1           Past Medical History:  Diagnosis Date  . BV (bacterial vaginosis)   . Constipation   . Eczema   . Eczema   . Environmental allergies   . Gall stones   . Heart murmur    pt reports as a child-but has resolved  . Infection    UTI  . Seasonal allergies     Past Surgical History:  Procedure Laterality Date  . ADENOIDECTOMY    . CHOLECYSTECTOMY N/A 07/31/2014   Procedure: LAPAROSCOPIC CHOLECYSTECTOMY WITH INTRAOPERATIVE CHOLANGIOGRAM;  Surgeon: Avel Peace, MD;  Location: [facility];  Service: General;  Laterality: N/A;  . THERAPEUTIC ABORTION     x3  . TONSILLECTOMY      Family History  Problem Relation Age of Onset  . Hypertension Maternal Grandmother   . Cancer Maternal Grandmother   . Hypertension Mother   . Cancer Maternal Aunt   . Anxiety disorder Maternal Uncle   . Diabetes Paternal Grandmother     Social History   Tobacco Use  . Smoking status: Never Smoker  . Smokeless tobacco: Never Used  Vaping Use  . Vaping Use: Former  Substance Use Topics  .  Alcohol use: Not Currently  . 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 Bee Venom Anaphylaxis and Other (See Comments)    Pt has an epipen  . Fire [employer] Anaphylaxis  . Amoxicillin Rash    CHILDHOOD ALLERGY Has patient had a PCN reaction causing immediate rash, facial/tongue/throat swelling, SOB or lightheadedness with hypotension: Yes Has patient had a PCN reaction causing severe rash involving mucus membranes or skin necrosis: No Has patient had a PCN reaction that required hospitalization No Has patient had a PCN reaction occurring within the last 10 years: No  If all of the above answers are "NO", then may proceed with Cephalosporin use.     Medications Prior to Admission  Medication Sig Dispense Refill Last Dose  . Doxylamine-Pyridoxine (DICLEGIS PO) Take by mouth at bedtime.   07/18/2020  . famotidine (PEPCID) 40 MG tablet Take 40 mg by mouth 2 (two) times daily.   07/18/2020 at Unknown time  . Cetirizine HCl 10 MG CAPS  Take 1 capsule (10 mg total) by mouth daily. 15 capsule 0   . metroNIDAZOLE (FLAGYL) 500 MG tablet Take 1 tablet (500 mg total) by mouth 2 (two) times daily. 14 tablet 0     Review of Systems  Constitutional: Negative.  Negative for fatigue and fever.  HENT: Negative.   Respiratory: Negative.  Negative for shortness of breath.   Cardiovascular: Negative.  Negative for chest pain.  Gastrointestinal: Negative.  Negative for abdominal pain, constipation, diarrhea, nausea and vomiting.  Genitourinary: Positive for pelvic pain and vaginal discharge. Negative for dysuria.  Musculoskeletal: Positive for back pain.  Neurological: Negative.  Negative for dizziness and headaches.   Physical Exam   Blood pressure 106/90, pulse 94, temperature 98.6 F (37 C), temperature source Oral, resp. rate 17, height 5' (1.524 m), weight 66.8 kg, last menstrual period 12/09/2019, SpO2 100 %.  Physical  Exam Vitals and nursing note reviewed.  Constitutional:      General: She is not in acute distress.    Appearance: She is well-developed and well-nourished.  HENT:     Head: Normocephalic.  Eyes:     Pupils: Pupils are equal, round, and reactive to light.  Cardiovascular:     Rate and Rhythm: Normal rate and regular rhythm.     Heart sounds: Normal heart sounds.  Pulmonary:     Effort: Pulmonary effort is normal. No respiratory distress.     Breath sounds: Normal breath sounds.  Abdominal:     General: Bowel sounds are normal. There is no distension.     Palpations: Abdomen is soft.     Tenderness: There is no abdominal tenderness.  Genitourinary:    Comments: Thick white adherent discharge inside labia minora Skin:    General: Skin is warm and dry.  Neurological:     Mental Status: She is alert and oriented to person, place, and time.  Psychiatric:        Mood and Affect: Mood and affect normal.        Behavior: Behavior normal.        Thought Content: Thought content normal.        Judgment: Judgment normal.    Fetal Tracing:  Baseline: 125 Variability: moderate Accels: 15x15 Decels: none  Toco: 1 uc  Cervix: closed/thick/posterior  MAU Course  Procedures Results for orders placed or performed during the hospital encounter of 07/19/20 (from the past 24 hour(s))  Urinalysis, Routine w reflex microscopic Urine, Clean Catch     Status: Abnormal   Collection Time: 07/19/20  3:00 PM  Result Value Ref Range   Color, Urine YELLOW YELLOW   APPearance HAZY (A) CLEAR   Specific Gravity, Urine 1.023 1.005 - 1.030   pH 7.0 5.0 - 8.0   Glucose, UA 150 (A) NEGATIVE mg/dL   Hgb urine dipstick NEGATIVE NEGATIVE   Bilirubin Urine NEGATIVE NEGATIVE   Ketones, ur NEGATIVE NEGATIVE mg/dL   Protein, ur NEGATIVE NEGATIVE mg/dL   Nitrite NEGATIVE NEGATIVE   Leukocytes,Ua NEGATIVE NEGATIVE  CBC     Status: Abnormal   Collection Time: 07/19/20  3:29 PM  Result Value Ref Range    WBC 9.1 4.0 - 10.5 K/uL   RBC 3.32 (L) 3.87 - 5.11 MIL/uL   Hemoglobin 10.8 (L) 12.0 - 15.0 g/dL   HCT 50.5 (L) 39.7 - 67.3 %   MCV 94.6 80.0 - 100.0 fL   MCH 32.5 26.0 - 34.0 pg   MCHC 34.4 30.0 - 36.0 g/dL   RDW  12.6 11.5 - 15.5 %   Platelets 180 150 - 400 K/uL   nRBC 0.0 0.0 - 0.2 %  Comprehensive metabolic panel     Status: Abnormal   Collection Time: 07/19/20  3:29 PM  Result Value Ref Range   Sodium 140 135 - 145 mmol/L   Potassium 3.7 3.5 - 5.1 mmol/L   Chloride 105 98 - 111 mmol/L   CO2 20 (L) 22 - 32 mmol/L   Glucose, Bld 111 (H) 70 - 99 mg/dL   BUN 8 6 - 20 mg/dL   Creatinine, Ser 6.83 0.44 - 1.00 mg/dL   Calcium 9.1 8.9 - 41.9 mg/dL   Total Protein 5.7 (L) 6.5 - 8.1 g/dL   Albumin 2.9 (L) 3.5 - 5.0 g/dL   AST 19 15 - 41 U/L   ALT 12 0 - 44 U/L   Alkaline Phosphatase 81 38 - 126 U/L   Total Bilirubin 0.5 0.3 - 1.2 mg/dL   GFR, Estimated >62 >22 mL/min   Anion gap 15 5 - 15   MDM UA Patient with one elevated BP on arrival. All others within normal limits and patient asymptomatic. Will get baseline labs today CBC, CMP, Protein/creat ratio  External yeast infection noted on exam. Discussed treatment regimen and patient is requesting PO medication for yeast.   Assessment and Plan   1. Back pain affecting pregnancy in third trimester   2. [redacted] weeks gestation of pregnancy   3. Elevated blood pressure affecting pregnancy, antepartum   4. Candidiasis of vagina during pregnancy   5. Pain of round ligament during pregnancy    -Discharge home in stable condition -Rx for diflucan sent to patient's pharmacy -Preterm labor precautions discussed. Encouraged patient to use pregnancy support belt daily. -Patient advised to follow-up with OB in [place] as scheduled for prenatal care -Patient may return to MAU as needed or if her condition were to change or worsen   Rolm Bookbinder CNM 07/19/2020, 3:24 PM

## 2020-07-19 NOTE — MAU Note (Signed)
G7P1 at 31.6 weeks presents with low back pain and suprapubic pain, Stating that it is causing her to have difficulty walking. Pt denies SROM, vaginal bleeding or discharge. Unsure of contractions-"my stomach gets tight and the baby balls up". Urine sample collected

## 2020-07-28 DIAGNOSIS — Z3480 Encounter for supervision of other normal pregnancy, unspecified trimester: Secondary | ICD-10-CM | POA: Diagnosis not present

## 2020-08-03 ENCOUNTER — Encounter (HOSPITAL_COMMUNITY): Payer: Self-pay | Admitting: Family Medicine

## 2020-08-03 ENCOUNTER — Other Ambulatory Visit: Payer: Self-pay

## 2020-08-03 ENCOUNTER — Inpatient Hospital Stay (HOSPITAL_COMMUNITY)
Admission: AD | Admit: 2020-08-03 | Discharge: 2020-08-03 | Disposition: A | Discharge status: Home / Self Care | Payer: Medicaid Other | Attending: Family Medicine | Admitting: Family Medicine

## 2020-08-03 DIAGNOSIS — N3000 Acute cystitis without hematuria: Secondary | ICD-10-CM

## 2020-08-03 DIAGNOSIS — R112 Nausea with vomiting, unspecified: Secondary | ICD-10-CM

## 2020-08-03 DIAGNOSIS — O212 Late vomiting of pregnancy: Secondary | ICD-10-CM | POA: Diagnosis not present

## 2020-08-03 DIAGNOSIS — O99891 Other specified diseases and conditions complicating pregnancy: Secondary | ICD-10-CM | POA: Diagnosis not present

## 2020-08-03 DIAGNOSIS — O2313 Infections of bladder in pregnancy, third trimester: Secondary | ICD-10-CM | POA: Insufficient documentation

## 2020-08-03 DIAGNOSIS — R81 Glycosuria: Secondary | ICD-10-CM | POA: Diagnosis not present

## 2020-08-03 DIAGNOSIS — Z3A34 34 weeks gestation of pregnancy: Secondary | ICD-10-CM | POA: Diagnosis not present

## 2020-08-03 LAB — URINALYSIS, ROUTINE W REFLEX MICROSCOPIC
Bilirubin Urine: NEGATIVE
Glucose, UA: >=500 mg/dL — AB
Hgb urine dipstick: NEGATIVE
Ketones, ur: 20 mg/dL — AB
Nitrite: NEGATIVE
Protein, ur: 30 mg/dL — AB
Specific Gravity, Urine: 1.023 (ref 1.005–1.030)
pH: 6 (ref 5.0–8.0)

## 2020-08-03 LAB — HEMOGLOBIN A1C
Hgb A1c MFr Bld: 5.1 % (ref 4.8–5.6)
Mean Plasma Glucose: 99.67 mg/dL

## 2020-08-03 LAB — WET PREP, GENITAL
Clue Cells Wet Prep HPF POC: NONE SEEN
Sperm: NONE SEEN
Trich, Wet Prep: NONE SEEN
Yeast Wet Prep HPF POC: NONE SEEN

## 2020-08-03 MED ORDER — CEFADROXIL 500 MG PO CAPS
500.0000 mg | ORAL_CAPSULE | Freq: Two times a day (BID) | ORAL | 0 refills | Status: DC
Start: 2020-08-03 — End: 2020-09-16

## 2020-08-03 MED ORDER — PANTOPRAZOLE SODIUM 40 MG PO TBEC
40.0000 mg | DELAYED_RELEASE_TABLET | Freq: Every day | ORAL | 1 refills | Status: DC
Start: 2020-08-03 — End: 2020-08-26

## 2020-08-03 MED ORDER — PANTOPRAZOLE SODIUM 40 MG PO TBEC
40.0000 mg | DELAYED_RELEASE_TABLET | Freq: Once | ORAL | Status: AC
  Administered 2020-08-03: 40 mg via ORAL
  Filled 2020-08-03: qty 1

## 2020-08-03 MED ORDER — ONDANSETRON 4 MG PO TBDP
4.0000 mg | ORAL_TABLET | Freq: Once | ORAL | Status: AC
  Administered 2020-08-03: 4 mg via ORAL
  Filled 2020-08-03: qty 1

## 2020-08-03 MED ORDER — FLUCONAZOLE 150 MG PO TABS
150.0000 mg | ORAL_TABLET | Freq: Once | ORAL | 0 refills | Status: AC
Start: 2020-08-03 — End: 2020-08-03

## 2020-08-03 MED ORDER — ONDANSETRON 4 MG PO TBDP
4.0000 mg | ORAL_TABLET | Freq: Four times a day (QID) | ORAL | 0 refills | Status: DC | PRN
Start: 2020-08-03 — End: 2020-08-20

## 2020-08-03 NOTE — MAU Note (Signed)
Pt stated she has had increased n/v for the past 3 weeks. meds are not working (Diclegis and famotidine) Also c/o burning in vaginal area no discharged . Reports some cramping but not very often, Good fetal movement reported

## 2020-08-03 NOTE — Discharge Instructions (Signed)
Pregnancy and Urinary Tract Infection  A urinary tract infection (UTI) is an infection of any part of the urinary tract. This includes the kidneys, the tubes that connect your kidneys to your bladder (ureters), the bladder, and the tube that carries urine out of your body (urethra). These organs make, store, and get rid of urine in the body. Your health care provider may use other names to describe the infection. An upper UTI affects the ureters and kidneys (pyelonephritis). A lower UTI affects the bladder (cystitis) and urethra (urethritis). Most urinary tract infections are caused by bacteria in your genital area, around the entrance to your urinary tract (urethra). These bacteria grow and cause irritation and inflammation of your urinary tract. You are more likely to develop a UTI during pregnancy because the physical and hormonal changes your body goes through can make it easier for bacteria to get into your urinary tract. Your growing baby also puts pressure on your bladder and can affect urine flow. It is important to recognize and treat UTIs in pregnancy because of the risk of serious complications for both you and your baby. How does this affect me? Symptoms of a UTI include:  Needing to urinate right away (urgently).  Frequent urination or passing small amounts of urine frequently.  Pain or burning with urination.  Blood in the urine.  Urine that smells bad or unusual.  Trouble urinating.  Cloudy urine.  Pain in the abdomen or lower back.  Vaginal discharge. You may also have:  Vomiting or a decreased appetite.  Confusion.  Irritability or tiredness.  A fever.  Diarrhea. How does this affect my baby? An untreated UTI during pregnancy could lead to a kidney infection or a systemic infection, which can cause health problems that could affect your baby. Possible complications of an untreated UTI include:  Giving birth to your baby before 37 weeks of pregnancy  (premature).  Having a baby with a low birth weight.  Developing high blood pressure during pregnancy (preeclampsia).  Having a low hemoglobin level (anemia). What can I do to lower my risk? To prevent a UTI:  Go to the bathroom as soon as you feel the need. Do not hold urine for long periods of time.  Always wipe from front to back, especially after a bowel movement. Use each tissue one time when you wipe.  Empty your bladder after sex.  Keep your genital area dry.  Drink 6-10 glasses of water each day.  Do not douche or use deodorant sprays. How is this treated? Treatment for this condition may include:  Antibiotic medicines that are safe to take during pregnancy.  Other medicines to treat less common causes of UTI. Follow these instructions at home:  If you were prescribed an antibiotic medicine, take it as told by your health care provider. Do not stop using the antibiotic even if you start to feel better.  Keep all follow-up visits as told by your health care provider. This is important. Contact a health care provider if:  Your symptoms do not improve or they get worse.  You have abnormal vaginal discharge. Get help right away if you:  Have a fever.  Have nausea and vomiting.  Have back or side pain.  Feel contractions in your uterus.  Have lower belly pain.  Have a gush of fluid from your vagina.  Have blood in your urine. Summary  A urinary tract infection (UTI) is an infection of any part of the urinary tract, which includes the   kidneys, ureters, bladder, and urethra.  Most urinary tract infections are caused by bacteria in your genital area, around the entrance to your urinary tract (urethra).  You are more likely to develop a UTI during pregnancy.  If you were prescribed an antibiotic medicine, take it as told by your health care provider. Do not stop using the antibiotic even if you start to feel better. This information is not intended to  replace advice given to you by your health care provider. Make sure you discuss any questions you have with your health care provider. Document Revised: 09/14/2018 Document Reviewed: 04/26/2018 Elsevier Patient Education  2021 [employer] Inc.  

## 2020-08-03 NOTE — MAU Provider Note (Signed)
History     CSN: [id number]  Arrival date and time: 08/03/20 1625   Event Date/Time   First Provider Initiated Contact with Patient 08/03/20 1734      Chief Complaint  Patient presents with  . Nausea  . Emesis   HPI This is a 24yo G7P1051 at 34 weeks who presents with increased nausea and vomiting and heartburn over the past 2 weeks. Uses diclegis and pepcid. Mostly keeps food down. Also having increased burning at her introitus with urination, which started a few days ago. Increased white vaginal discharge. No palliating or provoking factors.  In reviewing records in Care Everywhere, had UTI in December. Had dose of Fosfomycin and Azithromycin (to cover for Chlamydia). Has been getting care in [place]. Currently lives in [place] and is trying to transition care to here.    OB History    Gravida  7   Para  1   Term  1   Preterm  0   AB  5   Living  1     SAB  0   IAB  5   Ectopic  0   Multiple  0   Live Births  1           Past Medical History:  Diagnosis Date  . BV (bacterial vaginosis)   . Constipation   . Eczema   . Eczema   . Environmental allergies   . Gall stones   . Heart murmur    pt reports as a child-but has resolved  . Infection    UTI  . Seasonal allergies     Past Surgical History:  Procedure Laterality Date  . ADENOIDECTOMY    . CHOLECYSTECTOMY N/A 07/31/2014   Procedure: LAPAROSCOPIC CHOLECYSTECTOMY WITH INTRAOPERATIVE CHOLANGIOGRAM;  Surgeon: Avel Peace, MD;  Location: [facility];  Service: General;  Laterality: N/A;  . THERAPEUTIC ABORTION     x3  . TONSILLECTOMY      Family History  Problem Relation Age of Onset  . Hypertension Maternal Grandmother   . Cancer Maternal Grandmother   . Hypertension Mother   . Cancer Maternal Aunt   . Anxiety disorder Maternal Uncle   . Diabetes Paternal Grandmother     Social History   Tobacco Use  . Smoking status: Never Smoker  . Smokeless tobacco: Never Used  Vaping  Use  . Vaping Use: Former  Substance Use Topics  . Alcohol use: Not Currently  . 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 Bee Venom Anaphylaxis and Other (See Comments)    Pt has an epipen  . Fire [employer] Anaphylaxis  . Amoxicillin Rash    CHILDHOOD ALLERGY Has patient had a PCN reaction causing immediate rash, facial/tongue/throat swelling, SOB or lightheadedness with hypotension: Yes Has patient had a PCN reaction causing severe rash involving mucus membranes or skin necrosis: No Has patient had a PCN reaction that required hospitalization No Has patient had a PCN reaction occurring within the last 10 years: No  If all of the above answers are "NO", then may proceed with Cephalosporin use.     Medications Prior to Admission  Medication Sig Dispense Refill Last Dose  . Doxylamine-Pyridoxine (DICLEGIS PO) Take by mouth at bedtime.   08/02/2020 at Unknown time  . famotidine (PEPCID) 40 MG tablet Take 40 mg by mouth 2 (two) times daily.   08/02/2020 at Unknown time  .  fluconazole (DIFLUCAN) 150 MG tablet Take 1 tablet (150 mg total) by mouth daily. 1 tablet 0 08/02/2020 at Unknown time  . Prenatal Vit-Fe Fumarate-FA (PRENATAL MULTIVITAMIN) TABS tablet Take 1 tablet by mouth daily at 12 noon.   08/02/2020 at Unknown time  . Cetirizine HCl 10 MG CAPS Take 1 capsule (10 mg total) by mouth daily. 15 capsule 0 Unknown at Unknown time  . metroNIDAZOLE (FLAGYL) 500 MG tablet Take 1 tablet (500 mg total) by mouth 2 (two) times daily. 14 tablet 0 More than a month at Unknown time    Review of Systems Physical Exam   Blood pressure 113/69, pulse 90, temperature 98.1 F (36.7 C), temperature source Oral, resp. rate 18, height 5' (1.524 m), weight 66.2 kg, last menstrual period 12/09/2019, SpO2 100 %.  Physical Exam Vitals and nursing note reviewed. Exam conducted with a chaperone present.  Constitutional:      Appearance:  Normal appearance.  Cardiovascular:     Rate and Rhythm: Normal rate and regular rhythm.     Pulses: Normal pulses.  Pulmonary:     Effort: Pulmonary effort is normal.  Abdominal:     General: Abdomen is flat. There is no distension.     Palpations: Abdomen is soft. There is no mass.     Tenderness: There is no abdominal tenderness.     Hernia: No hernia is present. There is no hernia in the left inguinal area or right inguinal area.  Genitourinary:    Labia:        Right: No rash, tenderness or lesion.        Left: No rash, tenderness or lesion.      Vagina: No signs of injury and foreign body. No vaginal discharge or erythema.     Cervix: No cervical motion tenderness, discharge, friability or lesion.     Uterus: Not deviated.   Lymphadenopathy:     Lower Body: No right inguinal adenopathy. No left inguinal adenopathy.  Skin:    General: Skin is warm and dry.     Capillary Refill: Capillary refill takes less than 2 seconds.  Neurological:     General: No focal deficit present.     Mental Status: She is alert.  Psychiatric:        Mood and Affect: Mood normal.        Behavior: Behavior normal.        Thought Content: Thought content normal.        Judgment: Judgment normal.    Results for orders placed or performed during the hospital encounter of 08/03/20 (from the past 24 hour(s))  Urinalysis, Routine w reflex microscopic Urine, Clean Catch     Status: Abnormal   Collection Time: 08/03/20  4:54 PM  Result Value Ref Range   Color, Urine YELLOW YELLOW   APPearance CLOUDY (A) CLEAR   Specific Gravity, Urine 1.023 1.005 - 1.030   pH 6.0 5.0 - 8.0   Glucose, UA >=500 (A) NEGATIVE mg/dL   Hgb urine dipstick NEGATIVE NEGATIVE   Bilirubin Urine NEGATIVE NEGATIVE   Ketones, ur 20 (A) NEGATIVE mg/dL   Protein, ur 30 (A) NEGATIVE mg/dL   Nitrite NEGATIVE NEGATIVE   Leukocytes,Ua SMALL (A) NEGATIVE   RBC / HPF 0-5 0 - 5 RBC/hpf   WBC, UA 11-20 0 - 5 WBC/hpf   Bacteria, UA  MANY (A) NONE SEEN   Squamous Epithelial / LPF 11-20 0 - 5   Mucus PRESENT   Wet prep, genital  Status: Abnormal   Collection Time: 08/03/20  5:33 PM   Specimen: Genital  Result Value Ref Range   Yeast Wet Prep HPF POC NONE SEEN NONE SEEN   Trich, Wet Prep NONE SEEN NONE SEEN   Clue Cells Wet Prep HPF POC NONE SEEN NONE SEEN   WBC, Wet Prep HPF POC MANY (A) NONE SEEN   Sperm NONE SEEN      MAU Course  Procedures NST 130s, mod variability. + accel. No decels.  MDM Patient's nausea and GERD improved with protonix and Zofran.  Assessment and Plan     ICD-10-CM   1. [redacted] weeks gestation of pregnancy  Z3A.34   2. Acute cystitis without hematuria  N30.00   3. Glucosuria  R81   4. Nausea and vomiting, intractability of vomiting not specified, unspecified vomiting type  R11.2     Patient has glucosuria - concerning for GDM. Will get HgA1c. Will attempt to get patient into care here.  Prescription for zofran and protonix. Add on to existing medications.  UTI - will prescribe duricef. Will also give diflucan.   Levie Heritage 08/03/2020, 5:57 PM

## 2020-08-04 LAB — GC/CHLAMYDIA PROBE AMP (~~LOC~~) NOT AT ARMC
Chlamydia: NEGATIVE
Comment: NEGATIVE
Comment: NORMAL
Neisseria Gonorrhea: NEGATIVE

## 2020-08-05 DIAGNOSIS — Z3480 Encounter for supervision of other normal pregnancy, unspecified trimester: Secondary | ICD-10-CM | POA: Insufficient documentation

## 2020-08-06 ENCOUNTER — Ambulatory Visit (INDEPENDENT_AMBULATORY_CARE_PROVIDER_SITE_OTHER): Payer: Medicaid Other | Admitting: Obstetrics and Gynecology

## 2020-08-06 ENCOUNTER — Other Ambulatory Visit: Payer: Self-pay

## 2020-08-06 ENCOUNTER — Encounter: Payer: Self-pay | Admitting: Obstetrics and Gynecology

## 2020-08-06 DIAGNOSIS — Z3483 Encounter for supervision of other normal pregnancy, third trimester: Secondary | ICD-10-CM

## 2020-08-06 DIAGNOSIS — Z9049 Acquired absence of other specified parts of digestive tract: Secondary | ICD-10-CM | POA: Insufficient documentation

## 2020-08-06 DIAGNOSIS — Z3480 Encounter for supervision of other normal pregnancy, unspecified trimester: Secondary | ICD-10-CM

## 2020-08-06 DIAGNOSIS — Z3A34 34 weeks gestation of pregnancy: Secondary | ICD-10-CM

## 2020-08-06 DIAGNOSIS — Z8616 Personal history of COVID-19: Secondary | ICD-10-CM

## 2020-08-06 NOTE — Progress Notes (Signed)
Subjective:  Andrea Carson is a 25 y.o. G7P1051 at [redacted]w[redacted]d being seen today for ongoing prenatal care. Transferred from [facility] in [place]. Reports normal prenatal course. Currently being treated for UTI. H/O TSVD without problems. Denies any chronic medical problems or medications.  She is currently monitored for the following issues for this low-risk pregnancy and has Supervision of other normal pregnancy, antepartum; Hx laparoscopic cholecystectomy; and History of 2019 novel coronavirus disease (COVID-19) on their problem list.  Patient reports no complaints.   . Vag. Bleeding: None.  Movement: Present. Denies leaking of fluid.   The following portions of the patient's history were reviewed and updated as appropriate: allergies, current medications, past family history, past medical history, past social history, past surgical history and problem list. Problem list updated.  Objective:   Vitals:   08/06/20 0837  BP: 122/85  Pulse: 88    Fetal Status:     Movement: Present     General:  Alert, oriented and cooperative. Patient is in no acute distress.  Skin: Skin is warm and dry. No rash noted.   Cardiovascular: Normal heart rate noted  Respiratory: Normal respiratory effort, no problems with respiration noted  Abdomen: Soft, gravid, appropriate for gestational age. Pain/Pressure: Present     Pelvic:  Cervical exam deferred        Extremities: Normal range of motion.  Edema: None  Mental Status: Normal mood and affect. Normal behavior. Normal judgment and thought content.   Urinalysis:      Assessment and Plan:  Pregnancy: [id number] at [redacted]w[redacted]d  1. Supervision of other normal pregnancy, antepartum Stable Prenatal care reviewed with pt  2. History of 2019 novel coronavirus disease (COVID-19) Resolved  Preterm labor symptoms and general obstetric precautions including but not limited to vaginal bleeding, contractions, leaking of fluid and fetal movement were reviewed in detail with the  patient. Please refer to After Visit Summary for other counseling recommendations.  Return in about 2 weeks (around 08/20/2020) for OB visit, face to face, any provider.   Hermina Staggers, MD

## 2020-08-06 NOTE — Progress Notes (Signed)
+   Fetal movement

## 2020-08-06 NOTE — Patient Instructions (Signed)

## 2020-08-20 ENCOUNTER — Other Ambulatory Visit: Payer: Self-pay

## 2020-08-20 ENCOUNTER — Ambulatory Visit (INDEPENDENT_AMBULATORY_CARE_PROVIDER_SITE_OTHER): Payer: Medicaid Other

## 2020-08-20 ENCOUNTER — Other Ambulatory Visit (HOSPITAL_COMMUNITY)
Admission: RE | Admit: 2020-08-20 | Discharge: 2020-08-20 | Disposition: A | Discharge status: Home / Self Care | Payer: Medicaid Other | Source: Ambulatory Visit

## 2020-08-20 VITALS — BP 118/78 | HR 94 | Wt 149.0 lb

## 2020-08-20 DIAGNOSIS — M549 Dorsalgia, unspecified: Secondary | ICD-10-CM

## 2020-08-20 DIAGNOSIS — Z3480 Encounter for supervision of other normal pregnancy, unspecified trimester: Secondary | ICD-10-CM

## 2020-08-20 DIAGNOSIS — Z3A36 36 weeks gestation of pregnancy: Secondary | ICD-10-CM | POA: Diagnosis not present

## 2020-08-20 DIAGNOSIS — R112 Nausea with vomiting, unspecified: Secondary | ICD-10-CM

## 2020-08-20 DIAGNOSIS — O99891 Other specified diseases and conditions complicating pregnancy: Secondary | ICD-10-CM

## 2020-08-20 MED ORDER — ONDANSETRON 4 MG PO TBDP: 4.0000 mg | ORAL_TABLET | Freq: Four times a day (QID) | ORAL | 0 refills | Status: DC | PRN

## 2020-08-20 MED ORDER — DOCUSATE SODIUM 100 MG PO CAPS
100.0000 mg | ORAL_CAPSULE | Freq: Two times a day (BID) | ORAL | 2 refills | Status: DC | PRN
Start: 2020-08-20 — End: 2021-03-31

## 2020-08-20 NOTE — Progress Notes (Signed)
ROB [redacted]w[redacted]d   Pt had GC/CT on 08/03/20 at hospital was WNL.  CC: Vaginal pain/Back pain and pressure.  Pt needs RF on Nausea Rx's.

## 2020-08-20 NOTE — Patient Instructions (Signed)
Back Pain in Pregnancy Back pain during pregnancy is common. Back pain may be caused by several factors that are related to changes during your pregnancy. Follow these instructions at home: Managing pain, stiffness, and swelling  If directed, for sudden (acute) back pain, put ice on the painful area. ? Put ice in a plastic bag. ? Place a towel between your skin and the bag. ? Leave the ice on for 20 minutes, 2-3 times per day.  If directed, apply heat to the affected area before you exercise. Use the heat source that your health care provider recommends, such as a moist heat pack or a heating pad. ? Place a towel between your skin and the heat source. ? Leave the heat on for 20-30 minutes. ? Remove the heat if your skin turns bright red. This is especially important if you are unable to feel pain, heat, or cold. You may have a greater risk of getting burned.  If directed, massage the affected area.      Activity  Exercise as told by your health care provider. Gentle exercise is the best way to prevent or manage back pain.  Listen to your body when lifting. If lifting hurts, ask for help or bend your knees. This uses your leg muscles instead of your back muscles.  Squat down when picking up something from the floor. Do not bend over.  Only use bed rest for short periods as told by your health care provider. Bed rest should only be used for the most severe episodes of back pain. Standing, sitting, and lying down  Do not stand in one place for long periods of time.  Use good posture when sitting. Make sure your head rests over your shoulders and is not hanging forward. Use a pillow on your lower back if necessary.  Try sleeping on your side, preferably the left side, with a pregnancy support pillow or 1-2 regular pillows between your legs. ? If you have back pain after a night's rest, your bed may be too soft. ? A firm mattress may provide more support for your back during  pregnancy. General instructions  Do not wear high heels.  Eat a healthy diet. Try to gain weight within your health care provider's recommendations.  Use a maternity girdle, elastic sling, or back brace as told by your health care provider.  Take over-the-counter and prescription medicines only as told by your health care provider.  Work with a physical therapist or massage therapist to find ways to manage back pain. Acupuncture or massage therapy may be helpful.  Keep all follow-up visits as told by your health care provider. This is important. Contact a health care provider if:  Your back pain interferes with your daily activities.  You have increasing pain in other parts of your body. Get help right away if:  You develop numbness, tingling, weakness, or problems with the use of your arms or legs.  You develop severe back pain that is not controlled with medicine.  You have a change in bowel or bladder control.  You develop shortness of breath, dizziness, or you faint.  You develop nausea, vomiting, or sweating.  You have back pain that is a rhythmic, cramping pain similar to labor pains. Labor pain is usually 1-2 minutes apart, lasts for about 1 minute, and involves a bearing down feeling or pressure in your pelvis.  You have back pain and your water breaks or you have vaginal bleeding.  You have back pain or   numbness that travels down your leg.  Your back pain developed after you fell.  You develop pain on one side of your back.  You see blood in your urine.  You develop skin blisters in the area of your back pain. Summary  Back pain may be caused by several factors that are related to changes during your pregnancy.  Follow instructions as told by your health care provider for managing pain, stiffness, and swelling.  Exercise as told by your health care provider. Gentle exercise is the best way to prevent or manage back pain.  Take over-the-counter and  prescription medicines only as told by your health care provider.  Keep all follow-up visits as told by your health care provider. This is important. This information is not intended to replace advice given to you by your health care provider. Make sure you discuss any questions you have with your health care provider. Document Revised: 09/11/2018 Document Reviewed: 11/08/2017 Elsevier Patient Education  2021 Elsevier Inc.  

## 2020-08-20 NOTE — Progress Notes (Signed)
LOW-RISK PREGNANCY OFFICE VISIT  Patient name: Andrea Carson MRN [id number]  Date of birth: [birth year] Chief Complaint:   Routine Prenatal Visit  Subjective:   Andrea Carson is a 25 y.o. U2G2542 female at [redacted]w[redacted]d with an Estimated Date of Delivery: 09/14/20 being seen today for ongoing management of a low-risk pregnancy aeb has Supervision of other normal pregnancy, antepartum; Hx laparoscopic cholecystectomy; and History of 2019 novel coronavirus disease (COVID-19) on their problem list.  Patient presents today with complaint of back pain, vaginal pressure, and pain. Patient reports symptoms are constant and states the vaginal pain occasional is caused by a shooting sensation throughout the vagina.  She states she has not tried any interventions.  She also reports reoccurrence of nausea and requests refill of zofran. Patient endorses fetal movement. She denies vaginal concerns including abnormal discharge, leaking of fluid, and bleeding. Patient reports that she works in [place] and drives one hour and 45 minutes to work daily! Contractions: Irritability. Vag. Bleeding: None.  Movement: Present.  Reviewed past medical,surgical, social, obstetrical and family history as well as problem list, medications and allergies.  Objective   Vitals:   08/20/20 1548  BP: 118/78  Pulse: 94  Weight: 149 lb (67.6 kg)  Body mass index is 29.1 kg/m.  Total Weight Gain:Not found.         Physical Examination:   General appearance: Well appearing, and in no distress  Mental status: Alert, oriented to person, place, and time  Skin: Warm & dry  Cardiovascular: Normal heart rate noted  Respiratory: Normal respiratory effort, no distress  Abdomen: Soft, gravid, nontender, AGA with Fundal Height: 36 cm  Pelvic: Cervical exam performed  Dilation: 3 Effacement (%): 50 Station: Ballotable Presentation: Vertex  Extremities: Edema: Trace  Fetal Status: Fetal Heart Rate (bpm): 152  Movement: Present   No  results found for this or any previous visit (from the past 24 hour(s)).  Assessment & Plan:  Low-risk pregnancy of a 25 y.o., H0W2376 at [redacted]w[redacted]d with an Estimated Date of Delivery: 09/14/20   1. Supervision of other normal pregnancy, antepartum -Anticipatory guidance for upcoming appts. -Patient to next appt in 1 weeks for an in-person visit. -Labs as below. -Educated on GBS bacteria including what it is, why we test, and how and when we treat if needed. -Discussed releasing of results to mychart.  2. [redacted] weeks gestation of pregnancy -Reviewed and addressed complaints. -Patient informed that she should not travel after 37 weeks. -Will give not stating that patient should not engage in travel after 37 weeks and therefore will have to take off from work.  3. Nausea and vomiting, intractability of vomiting not specified, unspecified vomiting type -Reassured that normal to experience nausea and other 1st trimester symptoms in the 3rd trimester. -Discussed usage of antiemetic and patient reports she has used Zofran with good results. -Patient reports some constipation. -Cautioned that zofran can also cause worsening constipation. -Will send script for Colace and patient instructed to use at hs.  4. Back pain affecting pregnancy in third trimester -Reviewed usage of maternity belt/band to decrease back pain. -Informed that she can use tylenol and heating pads as needed. -Reassured that feeling of "shooting pain" in vaginal area is normal and r/t pelvic pressure/fetal position.    Meds:  Meds ordered this encounter  Medications  . docusate sodium (COLACE) 100 MG capsule    Sig: Take 1 capsule (100 mg total) by mouth 2 (two) times daily as needed.    Dispense:  30 capsule    Refill:  2    Order Specific Question:   Supervising Provider    Answer:   Reva Bores [[id number]]  . ondansetron (ZOFRAN ODT) 4 MG disintegrating tablet    Sig: Take 1 tablet (4 mg total) by mouth every 6 (six) hours  as needed for nausea.    Dispense:  20 tablet    Refill:  0    Order Specific Question:   Supervising Provider    Answer:   Reva Bores [[id number]]   Labs/procedures today:   Lab Orders     Strep Gp B Culture+Rflx   Reviewed: Preterm labor symptoms and general obstetric precautions including but not limited to vaginal bleeding, contractions, leaking of fluid and fetal movement were reviewed in detail with the patient.  All questions were answered.  Follow-up: Return in about 1 week (around 08/27/2020) for LROB.  Orders Placed This Encounter  Procedures  . Strep Gp B Culture+Rflx   Cherre Robins MSN, CNM

## 2020-08-24 LAB — CERVICOVAGINAL ANCILLARY ONLY
Chlamydia: NEGATIVE
Comment: NEGATIVE
Comment: NEGATIVE
Comment: NORMAL
Neisseria Gonorrhea: NEGATIVE
Trichomonas: NEGATIVE

## 2020-08-24 LAB — STREP GP B CULTURE+RFLX: Strep Gp B Culture+Rflx: NEGATIVE

## 2020-08-26 ENCOUNTER — Other Ambulatory Visit: Payer: Self-pay | Admitting: Family Medicine

## 2020-08-27 ENCOUNTER — Ambulatory Visit (INDEPENDENT_AMBULATORY_CARE_PROVIDER_SITE_OTHER): Payer: Medicaid Other | Admitting: Obstetrics and Gynecology

## 2020-08-27 ENCOUNTER — Other Ambulatory Visit: Payer: Self-pay

## 2020-08-27 VITALS — BP 118/79 | HR 103 | Wt 149.0 lb

## 2020-08-27 DIAGNOSIS — Z3A37 37 weeks gestation of pregnancy: Secondary | ICD-10-CM

## 2020-08-27 DIAGNOSIS — Z3480 Encounter for supervision of other normal pregnancy, unspecified trimester: Secondary | ICD-10-CM

## 2020-08-27 NOTE — Progress Notes (Signed)
ROB c/o back pain

## 2020-08-27 NOTE — Patient Instructions (Signed)

## 2020-08-27 NOTE — Progress Notes (Signed)
° °  PRENATAL VISIT NOTE  Subjective:  Andrea Carson is a 25 y.o. G7P1051 at [redacted]w[redacted]d being seen today for ongoing prenatal care.  She is currently monitored for the following issues for this low-risk pregnancy and has Supervision of other normal pregnancy, antepartum; Hx laparoscopic cholecystectomy; and History of 2019 novel coronavirus disease (COVID-19) on their problem list.  Patient reports no complaints.  Contractions: Irregular. Vag. Bleeding: None.  Movement: Present. Denies leaking of fluid.   The following portions of the patient's history were reviewed and updated as appropriate: allergies, current medications, past family history, past medical history, past social history, past surgical history and problem list.   Objective:   Vitals:   08/27/20 1506  BP: 118/79  Pulse: (!) 103  Weight: 149 lb (67.6 kg)    Fetal Status: Fetal Heart Rate (bpm): 141   Movement: Present  Presentation: Vertex  General:  Alert, oriented and cooperative. Patient is in no acute distress.  Skin: Skin is warm and dry. No rash noted.   Cardiovascular: Normal heart rate noted  Respiratory: Normal respiratory effort, no problems with respiration noted  Abdomen: Soft, gravid, appropriate for gestational age.  Pain/Pressure: Present     Pelvic: Cervical exam performed in the presence of a chaperone Dilation: 3 Effacement (%): 50 Station: -2  Extremities: Normal range of motion.  Edema: None  Mental Status: Normal mood and affect. Normal behavior. Normal judgment and thought content.   Assessment and Plan:  Pregnancy: [id number] at [redacted]w[redacted]d 1. Supervision of other normal pregnancy, antepartum -desires eIOL if does not go into labor by 39 weeks. Discussed.   2. [redacted] weeks gestation of pregnancy -GBS neg, still undecided on birth control   Term labor symptoms and general obstetric precautions including but not limited to vaginal bleeding, contractions, leaking of fluid and fetal movement were reviewed in detail  with the patient. Please refer to After Visit Summary for other counseling recommendations.   Return in about 1 week (around 09/03/2020) for OB, any provider.  Future Appointments  Date Time Provider Department Center  09/03/2020  3:40 PM Gerrit Heck, CNM [facility] None    Gita Kudo, MD

## 2020-09-03 ENCOUNTER — Ambulatory Visit (INDEPENDENT_AMBULATORY_CARE_PROVIDER_SITE_OTHER): Payer: Medicaid Other

## 2020-09-03 ENCOUNTER — Other Ambulatory Visit: Payer: Self-pay

## 2020-09-03 VITALS — BP 122/83 | HR 93 | Wt 150.0 lb

## 2020-09-03 DIAGNOSIS — Z3A38 38 weeks gestation of pregnancy: Secondary | ICD-10-CM

## 2020-09-03 DIAGNOSIS — Z3480 Encounter for supervision of other normal pregnancy, unspecified trimester: Secondary | ICD-10-CM

## 2020-09-03 NOTE — Progress Notes (Signed)
   LOW-RISK PREGNANCY OFFICE VISIT  Patient name: Andrea Carson MRN [id number]  Date of birth: 1995/11/17 Chief Complaint:   Routine Prenatal Visit  Subjective:   Andrea Carson is a 25 y.o. T2W5809 female at [redacted]w[redacted]d with an Estimated Date of Delivery: 09/14/20 being seen today for ongoing management of a low-risk pregnancy aeb has Supervision of other normal pregnancy, antepartum; Hx laparoscopic cholecystectomy; and History of 2019 novel coronavirus disease (COVID-19) on their problem list.  Patient presents today with complaint of fatigue.  Patient endorses fetal movement. Patient denies vaginal concerns including abnormal discharge, leaking of fluid, and bleeding. She reports occasional contractions.  Contractions: Irritability. Vag. Bleeding: None.  Movement: Present.  Reviewed past medical,surgical, social, obstetrical and family history as well as problem list, medications and allergies.  Objective   Vitals:   09/03/20 1544  BP: 122/83  Pulse: 93  Weight: 150 lb (68 kg)  Body mass index is 29.29 kg/m.  Total Weight Gain:22 lb (9.979 kg)         Physical Examination:   General appearance: Well appearing, and in no distress  Mental status: Alert, oriented to person, place, and time  Skin: Warm & dry  Cardiovascular: Normal heart rate noted  Respiratory: Normal respiratory effort, no distress  Abdomen: Soft, gravid, nontender, AGA with  Fundal Height: 37 cm  Pelvic: Cervical exam performed  Dilation: 3 Effacement (%): 50 Station: -3 Presentation: Vertex  Extremities: Edema: None  Fetal Status: Fetal Heart Rate (bpm): 134  Movement: Present   No results found for this or any previous visit (from the past 24 hour(s)).  Assessment & Plan:  Low-risk pregnancy of a 25 y.o., X8P3825 at [redacted]w[redacted]d with an Estimated Date of Delivery: 09/14/20   1. Supervision of other normal pregnancy, antepartum -Anticipatory guidance for upcoming appts. -Patient to next appt in 1 weeks for an in-person  visit. -Cervical exam performed and discussed.  2. [redacted] weeks gestation of pregnancy -Doing well. -Patient requests elective induction. -Discussed that she is candidate with cervix at 3cm can schedule for 40+ weeks. -Patient requests midnight slot d/t to childcare issues.  -Request [facility] for April [facility] at [facility] -Admission orders placed.       Meds: No orders of the defined types were placed in this encounter.  Labs/procedures today:  Lab Orders  No laboratory test(s) ordered today     Reviewed: Term labor symptoms and general obstetric precautions including but not limited to vaginal bleeding, contractions, leaking of fluid and fetal movement were reviewed in detail with the patient.  All questions were answered.  Follow-up: Return in about 1 week (around 09/10/2020) for LROB.  No orders of the defined types were placed in this encounter.  Cherre Robins MSN, CNM 09/03/2020

## 2020-09-03 NOTE — Progress Notes (Signed)
ROB [redacted]w[redacted]d  Pt wants cervix check today.  CC: Back pain.

## 2020-09-03 NOTE — Patient Instructions (Signed)
Labor Induction Labor induction is when steps are taken to cause a pregnant woman to begin the labor process. Most women go into labor on their own between 37 weeks and 42 weeks of pregnancy. When this does not happen, or when there is a medical need for labor to begin, steps may be taken to induce, or bring on, labor. Labor induction causes a pregnant woman's uterus to contract. It also causes the cervix to soften (ripen), open (dilate), and thin out. Usually, labor is not induced before 39 weeks of pregnancy unless there is a medical reason to do so. When is labor induction considered? Labor induction may be right for you if:  Your pregnancy lasts longer than 41 to 42 weeks.  Your placenta is separating from your uterus (placental abruption).  You have a rupture of membranes and your labor does not begin.  You have health problems, like diabetes or high blood pressure (preeclampsia) during your pregnancy.  Your baby has stopped growing or does not have enough amniotic fluid. Before labor induction begins, your health care provider will consider the following factors:  Your medical condition and the baby's condition.  How many weeks you have been pregnant.  How mature the baby's lungs are.  The condition of your cervix.  The position of the baby.  The size of your birth canal. Tell a health care provider about:  Any allergies you have.  All medicines you are taking, including vitamins, herbs, eye drops, creams, and over-the-counter medicines.  Any problems you or your family members have had with anesthetic medicines.  Any surgeries you have had.  Any blood disorders you have.  Any medical conditions you have. What are the risks? Generally, this is a safe procedure. However, problems may occur, including:  Failed induction.  Changes in fetal heart rate, such as being too high, too low, or irregular (erratic).  Infection in the mother or the baby.  Increased risk of  having a cesarean delivery.  Breaking off (abruption) of the placenta from the uterus. This is rare.  Rupture of the uterus. This is very rare.  Your baby could fail to get enough blood flow or oxygen. This can be life-threatening. When induction is needed for medical reasons, the benefits generally outweigh the risks. What happens during the procedure? During the procedure, your health care provider will use one of these methods to induce labor:  Stripping the membranes. In this method, the amniotic sac tissue is gently separated from the cervix. This causes the following to happen: ? Your cervix stretches, which in turn causes the release of prostaglandins. ? Prostaglandins induce labor and cause the uterus to contract. ? This procedure is often done in an office visit. You will be sent home to wait for contractions to begin.  Prostaglandin medicine. This medicine starts contractions and causes the cervix to dilate and ripen. This can be taken by mouth (orally) or by being inserted into the vagina (suppository).  Inserting a small, thin tube (catheter) with a balloon into the vagina and then expanding the balloon with water to dilate the cervix.  Breaking the water. In this method, a small instrument is used to make a small hole in the amniotic sac. This eventually causes the amniotic sac to break. Contractions should begin within a few hours.  Medicine to trigger or strengthen contractions. This medicine is given through an IV that is inserted into a vein in your arm. This procedure may vary among health care providers and hospitals.     Where to find more information  March of Dimes: www.marchofdimes.org  The American College of Obstetricians and Gynecologists: www.acog.org Summary  Labor induction causes a pregnant woman's uterus to contract. It also causes the cervix to soften (ripen), open (dilate), and thin out.  Labor is usually not induced before 39 weeks of pregnancy unless  there is a medical reason to do so.  When induction is needed for medical reasons, the benefits generally outweigh the risks.  Talk with your health care provider about which methods of labor induction are right for you. This information is not intended to replace advice given to you by your health care provider. Make sure you discuss any questions you have with your health care provider. Document Revised: 03/05/2020 Document Reviewed: 03/05/2020 Elsevier Patient Education  2021 Elsevier Inc.  

## 2020-09-07 ENCOUNTER — Telehealth (HOSPITAL_COMMUNITY): Payer: Self-pay | Admitting: *Deleted

## 2020-09-07 ENCOUNTER — Other Ambulatory Visit (HOSPITAL_COMMUNITY): Payer: Self-pay | Admitting: Advanced Practice Midwife

## 2020-09-07 NOTE — Telephone Encounter (Signed)
Preadmission screen  

## 2020-09-09 ENCOUNTER — Encounter (HOSPITAL_COMMUNITY): Payer: Self-pay | Admitting: *Deleted

## 2020-09-09 ENCOUNTER — Ambulatory Visit (INDEPENDENT_AMBULATORY_CARE_PROVIDER_SITE_OTHER): Payer: Medicaid Other | Admitting: Obstetrics

## 2020-09-09 ENCOUNTER — Telehealth (HOSPITAL_COMMUNITY): Payer: Self-pay | Admitting: *Deleted

## 2020-09-09 ENCOUNTER — Other Ambulatory Visit: Payer: Self-pay

## 2020-09-09 ENCOUNTER — Encounter: Payer: Self-pay | Admitting: Obstetrics

## 2020-09-09 VITALS — BP 124/88 | HR 72 | Wt 153.0 lb

## 2020-09-09 DIAGNOSIS — Z3480 Encounter for supervision of other normal pregnancy, unspecified trimester: Secondary | ICD-10-CM

## 2020-09-09 NOTE — Progress Notes (Signed)
Subjective:  Andrea Carson is a 25 y.o. G7P1051 at [redacted]w[redacted]d being seen today for ongoing prenatal care.  She is currently monitored for the following issues for this low-risk pregnancy and has Supervision of other normal pregnancy, antepartum; Hx laparoscopic cholecystectomy; and History of 2019 novel coronavirus disease (COVID-19) on their problem list.  Patient reports no complaints.  Contractions: Irritability. Vag. Bleeding: None.  Movement: Present. Denies leaking of fluid.   The following portions of the patient's history were reviewed and updated as appropriate: allergies, current medications, past family history, past medical history, past social history, past surgical history and problem list. Problem list updated.  Objective:   Vitals:   09/09/20 1112  BP: 124/88  Pulse: 72  Weight: 153 lb (69.4 kg)    Fetal Status:     Movement: Present     General:  Alert, oriented and cooperative. Patient is in no acute distress.  Skin: Skin is warm and dry. No rash noted.   Cardiovascular: Normal heart rate noted  Respiratory: Normal respiratory effort, no problems with respiration noted  Abdomen: Soft, gravid, appropriate for gestational age. Pain/Pressure: Present     Pelvic:  Cervical exam deferred        Extremities: Normal range of motion.  Edema: None  Mental Status: Normal mood and affect. Normal behavior. Normal judgment and thought content.   Urinalysis:      Assessment and Plan:  Pregnancy: [id number] at [redacted]w[redacted]d  1. Supervision of other normal pregnancy, antepartum   Term labor symptoms and general obstetric precautions including but not limited to vaginal bleeding, contractions, leaking of fluid and fetal movement were reviewed in detail with the patient. Please refer to After Visit Summary for other counseling recommendations.   Return for Postpartum .   Brock Bad, MD  09/09/20

## 2020-09-09 NOTE — Telephone Encounter (Signed)
Preadmission screen  

## 2020-09-09 NOTE — Progress Notes (Signed)
ROB [redacted]w[redacted]d  Cervix check: Declined  CC: vaginal irritation on top of vaginal area  from recent wax no discharge and no odor.

## 2020-09-10 ENCOUNTER — Encounter: Payer: Medicaid Other | Admitting: Obstetrics

## 2020-09-11 ENCOUNTER — Other Ambulatory Visit (HOSPITAL_COMMUNITY)
Admission: RE | Admit: 2020-09-11 | Discharge: 2020-09-11 | Disposition: A | Discharge status: Home / Self Care | Payer: Medicaid Other | Source: Ambulatory Visit | Attending: Obstetrics and Gynecology | Admitting: Obstetrics and Gynecology

## 2020-09-11 ENCOUNTER — Other Ambulatory Visit: Payer: Self-pay | Admitting: Family Medicine

## 2020-09-11 DIAGNOSIS — Z20822 Contact with and (suspected) exposure to covid-19: Secondary | ICD-10-CM | POA: Diagnosis not present

## 2020-09-11 DIAGNOSIS — Z01812 Encounter for preprocedural laboratory examination: Secondary | ICD-10-CM | POA: Insufficient documentation

## 2020-09-12 LAB — SARS CORONAVIRUS 2 (TAT 6-24 HRS): SARS Coronavirus 2: NEGATIVE

## 2020-09-13 ENCOUNTER — Other Ambulatory Visit: Payer: Self-pay

## 2020-09-14 ENCOUNTER — Inpatient Hospital Stay (HOSPITAL_COMMUNITY): Payer: Medicaid Other | Admitting: Anesthesiology

## 2020-09-14 ENCOUNTER — Encounter (HOSPITAL_COMMUNITY): Payer: Self-pay | Admitting: Family Medicine

## 2020-09-14 ENCOUNTER — Other Ambulatory Visit (HOSPITAL_COMMUNITY): Payer: Medicaid Other

## 2020-09-14 ENCOUNTER — Inpatient Hospital Stay (HOSPITAL_COMMUNITY)
Admission: AD | Admit: 2020-09-14 | Discharge: 2020-09-16 | DRG: 807 | Disposition: A | Discharge status: Home / Self Care | Payer: Medicaid Other | Attending: Obstetrics & Gynecology | Admitting: Obstetrics & Gynecology

## 2020-09-14 ENCOUNTER — Inpatient Hospital Stay (HOSPITAL_COMMUNITY): Payer: Medicaid Other

## 2020-09-14 ENCOUNTER — Other Ambulatory Visit: Payer: Self-pay

## 2020-09-14 DIAGNOSIS — O163 Unspecified maternal hypertension, third trimester: Secondary | ICD-10-CM | POA: Diagnosis not present

## 2020-09-14 DIAGNOSIS — Z3A40 40 weeks gestation of pregnancy: Secondary | ICD-10-CM | POA: Diagnosis not present

## 2020-09-14 DIAGNOSIS — Z8616 Personal history of COVID-19: Secondary | ICD-10-CM

## 2020-09-14 DIAGNOSIS — O9902 Anemia complicating childbirth: Secondary | ICD-10-CM | POA: Diagnosis not present

## 2020-09-14 DIAGNOSIS — O4202 Full-term premature rupture of membranes, onset of labor within 24 hours of rupture: Secondary | ICD-10-CM | POA: Diagnosis not present

## 2020-09-14 DIAGNOSIS — Z880 Allergy status to penicillin: Secondary | ICD-10-CM

## 2020-09-14 DIAGNOSIS — O1404 Mild to moderate pre-eclampsia, complicating childbirth: Secondary | ICD-10-CM | POA: Diagnosis not present

## 2020-09-14 DIAGNOSIS — O26893 Other specified pregnancy related conditions, third trimester: Secondary | ICD-10-CM | POA: Diagnosis not present

## 2020-09-14 DIAGNOSIS — O480 Post-term pregnancy: Secondary | ICD-10-CM | POA: Diagnosis not present

## 2020-09-14 DIAGNOSIS — O1400 Mild to moderate pre-eclampsia, unspecified trimester: Secondary | ICD-10-CM | POA: Diagnosis not present

## 2020-09-14 DIAGNOSIS — O1405 Mild to moderate pre-eclampsia, complicating the puerperium: Secondary | ICD-10-CM | POA: Diagnosis not present

## 2020-09-14 LAB — CBC
HCT: 31.4 % — ABNORMAL LOW (ref 36.0–46.0)
HCT: 31.4 % — ABNORMAL LOW (ref 36.0–46.0)
Hemoglobin: 10.8 g/dL — ABNORMAL LOW (ref 12.0–15.0)
Hemoglobin: 10.5 g/dL — ABNORMAL LOW (ref 12.0–15.0)
MCH: 32.4 pg (ref 26.0–34.0)
MCH: 32.3 pg (ref 26.0–34.0)
MCHC: 34.4 g/dL (ref 30.0–36.0)
MCHC: 33.4 g/dL (ref 30.0–36.0)
MCV: 94.3 fL (ref 80.0–100.0)
MCV: 96.6 fL (ref 80.0–100.0)
Platelets: 209 10*3/uL (ref 150–400)
Platelets: 160 10*3/uL (ref 150–400)
RBC: 3.33 MIL/uL — ABNORMAL LOW (ref 3.87–5.11)
RBC: 3.25 MIL/uL — ABNORMAL LOW (ref 3.87–5.11)
RDW: 13.7 % (ref 11.5–15.5)
RDW: 14 % (ref 11.5–15.5)
WBC: 7.5 10*3/uL (ref 4.0–10.5)
WBC: 12.5 10*3/uL — ABNORMAL HIGH (ref 4.0–10.5)
nRBC: 0 % (ref 0.0–0.2)
nRBC: 0 % (ref 0.0–0.2)

## 2020-09-14 LAB — COMPREHENSIVE METABOLIC PANEL
ALT: 10 U/L (ref 0–44)
AST: 18 U/L (ref 15–41)
Albumin: 2.6 g/dL — ABNORMAL LOW (ref 3.5–5.0)
Alkaline Phosphatase: 123 U/L (ref 38–126)
Anion gap: 9 (ref 5–15)
BUN: 7 mg/dL (ref 6–20)
CO2: 21 mmol/L — ABNORMAL LOW (ref 22–32)
Calcium: 8.6 mg/dL — ABNORMAL LOW (ref 8.9–10.3)
Chloride: 107 mmol/L (ref 98–111)
Creatinine, Ser: 0.8 mg/dL (ref 0.44–1.00)
GFR, Estimated: >60 mL/min (ref >60)
Glucose, Bld: 97 mg/dL (ref 70–99)
Potassium: 3.7 mmol/L (ref 3.5–5.1)
Sodium: 137 mmol/L (ref 135–145)
Total Bilirubin: 0.7 mg/dL (ref 0.3–1.2)
Total Protein: 5.2 g/dL — ABNORMAL LOW (ref 6.5–8.1)

## 2020-09-14 LAB — TYPE AND SCREEN
ABO/RH(D): O POS
Antibody Screen: NEGATIVE

## 2020-09-14 LAB — PROTEIN / CREATININE RATIO, URINE
Creatinine, Urine: 99.09 mg/dL
Protein Creatinine Ratio: 0.32 mg/mg{Cre} — ABNORMAL HIGH (ref 0.00–0.15)
Total Protein, Urine: 32 mg/dL

## 2020-09-14 LAB — RPR: RPR Ser Ql: NONREACTIVE

## 2020-09-14 MED ORDER — DIPHENHYDRAMINE HCL 50 MG/ML IJ SOLN
INTRAMUSCULAR | Status: AC
  Filled 2020-09-14: qty 1

## 2020-09-14 MED ORDER — LIDOCAINE HCL (PF) 1 % IJ SOLN
INTRAMUSCULAR | Status: DC | PRN
  Administered 2020-09-14: 5 mL via EPIDURAL

## 2020-09-14 MED ORDER — OXYTOCIN-SODIUM CHLORIDE 30-0.9 UT/500ML-% IV SOLN: 1.0000 m[IU]/min | INTRAVENOUS | Status: DC

## 2020-09-14 MED ORDER — FAMOTIDINE 20 MG PO TABS
10.0000 mg | ORAL_TABLET | Freq: Every day | ORAL | Status: DC
  Administered 2020-09-14: 10 mg via ORAL
  Filled 2020-09-14: qty 1

## 2020-09-14 MED ORDER — OXYTOCIN-SODIUM CHLORIDE 30-0.9 UT/500ML-% IV SOLN: 2.5000 [IU]/h | INTRAVENOUS | Status: DC

## 2020-09-14 MED ORDER — ONDANSETRON HCL 4 MG/2ML IJ SOLN
4.0000 mg | Freq: Four times a day (QID) | INTRAMUSCULAR | Status: DC | PRN
Start: 2020-09-14 — End: 2020-09-15
  Administered 2020-09-14: 4 mg via INTRAVENOUS
  Filled 2020-09-14: qty 2

## 2020-09-14 MED ORDER — TERBUTALINE SULFATE 1 MG/ML IJ SOLN
0.2500 mg | Freq: Once | INTRAMUSCULAR | Status: AC | PRN
  Administered 2020-09-14: 0.25 mg via SUBCUTANEOUS
  Filled 2020-09-14: qty 1

## 2020-09-14 MED ORDER — EPHEDRINE 5 MG/ML INJ: 10.0000 mg | INTRAVENOUS | Status: DC | PRN

## 2020-09-14 MED ORDER — FENTANYL-BUPIVACAINE-NACL 0.5-0.125-0.9 MG/250ML-% EP SOLN
EPIDURAL | Status: DC | PRN
  Administered 2020-09-14: 12 mL/h via EPIDURAL

## 2020-09-14 MED ORDER — SOD CITRATE-CITRIC ACID 500-334 MG/5ML PO SOLN
30.0000 mL | ORAL | Status: DC | PRN
  Filled 2020-09-14: qty 15

## 2020-09-14 MED ORDER — PHENYLEPHRINE 40 MCG/ML (10ML) SYRINGE FOR IV PUSH (FOR BLOOD PRESSURE SUPPORT)
80.0000 ug | PREFILLED_SYRINGE | INTRAVENOUS | Status: DC | PRN
  Administered 2020-09-14: 80 ug via INTRAVENOUS

## 2020-09-14 MED ORDER — OXYTOCIN BOLUS FROM INFUSION
333.0000 mL | Freq: Once | INTRAVENOUS | Status: AC
  Administered 2020-09-14: 333 mL via INTRAVENOUS

## 2020-09-14 MED ORDER — ACETAMINOPHEN 325 MG PO TABS: 650.0000 mg | ORAL_TABLET | ORAL | Status: DC | PRN

## 2020-09-14 MED ORDER — LIDOCAINE HCL (PF) 1 % IJ SOLN: 30.0000 mL | INTRAMUSCULAR | Status: DC | PRN

## 2020-09-14 MED ORDER — OXYTOCIN-SODIUM CHLORIDE 30-0.9 UT/500ML-% IV SOLN
1.0000 m[IU]/min | INTRAVENOUS | Status: DC
  Administered 2020-09-14: 4 m[IU]/min via INTRAVENOUS
  Administered 2020-09-14: 2 m[IU]/min via INTRAVENOUS
  Filled 2020-09-14: qty 500

## 2020-09-14 MED ORDER — LACTATED RINGERS IV SOLN: INTRAVENOUS | Status: DC

## 2020-09-14 MED ORDER — LACTATED RINGERS IV SOLN
500.0000 mL | INTRAVENOUS | Status: DC | PRN
  Administered 2020-09-14 (×5): 500 mL via INTRAVENOUS

## 2020-09-14 MED ORDER — FENTANYL CITRATE (PF) 100 MCG/2ML IJ SOLN
50.0000 ug | INTRAMUSCULAR | Status: DC | PRN
  Administered 2020-09-14 (×2): 100 ug via INTRAVENOUS
  Filled 2020-09-14 (×2): qty 2

## 2020-09-14 MED ORDER — FENTANYL-BUPIVACAINE-NACL 0.5-0.125-0.9 MG/250ML-% EP SOLN
12.0000 mL/h | EPIDURAL | Status: DC | PRN
Start: 2020-09-14 — End: 2020-09-15
  Filled 2020-09-14: qty 250

## 2020-09-14 MED ORDER — DIPHENHYDRAMINE HCL 50 MG/ML IJ SOLN
25.0000 mg | Freq: Once | INTRAMUSCULAR | Status: AC
  Administered 2020-09-14: 25 mg via INTRAVENOUS

## 2020-09-14 MED ORDER — LACTATED RINGERS AMNIOINFUSION
INTRAVENOUS | Status: DC
Start: 2020-09-14 — End: 2020-09-15

## 2020-09-14 MED ORDER — PHENYLEPHRINE 40 MCG/ML (10ML) SYRINGE FOR IV PUSH (FOR BLOOD PRESSURE SUPPORT)
80.0000 ug | PREFILLED_SYRINGE | INTRAVENOUS | Status: DC | PRN
  Filled 2020-09-14: qty 10

## 2020-09-14 MED ORDER — DIPHENHYDRAMINE HCL 50 MG/ML IJ SOLN: 12.5000 mg | INTRAMUSCULAR | Status: DC | PRN

## 2020-09-14 MED ORDER — LACTATED RINGERS IV SOLN: 500.0000 mL | Freq: Once | INTRAVENOUS | Status: DC

## 2020-09-14 MED ORDER — TERBUTALINE SULFATE 1 MG/ML IJ SOLN
0.2500 mg | Freq: Once | INTRAMUSCULAR | Status: AC
  Administered 2020-09-14: 0.25 mg via SUBCUTANEOUS

## 2020-09-14 NOTE — Plan of Care (Signed)
  Problem: Education: Goal: Knowledge of Childbirth will improve Outcome: Completed/Met Goal: Ability to make informed decisions regarding treatment and plan of care will improve Outcome: Completed/Met Goal: Ability to state and carry out methods to decrease the pain will improve Outcome: Completed/Met Goal: Individualized Educational Video(s) Outcome: Completed/Met

## 2020-09-14 NOTE — Anesthesia Preprocedure Evaluation (Signed)
Anesthesia Evaluation  Patient identified by MRN, date of birth, ID band Patient awake    Reviewed: Allergy & Precautions, NPO status , Patient's Chart, lab work & pertinent test results  Airway Mallampati: II  TM Distance: >3 FB Neck ROM: Full    Dental no notable dental hx. (+) Teeth Intact, Dental Advisory Given   Pulmonary neg pulmonary ROS,    Pulmonary exam normal breath sounds clear to auscultation       Cardiovascular negative cardio ROS Normal cardiovascular exam Rhythm:Regular Rate:Normal     Neuro/Psych negative neurological ROS  negative psych ROS   GI/Hepatic negative GI ROS, Neg liver ROS,   Endo/Other  negative endocrine ROS  Renal/GU negative Renal ROS     Musculoskeletal negative musculoskeletal ROS (+)   Abdominal   Peds  Hematology Lab Results      Component                Value               Date                      WBC                      7.5                 09/14/2020                HGB                      10.8 (L)            09/14/2020                HCT                      31.4 (L)            09/14/2020                MCV                      94.3                09/14/2020                PLT                      209                 09/14/2020              Anesthesia Other Findings   Reproductive/Obstetrics (+) Pregnancy                             Anesthesia Physical Anesthesia Plan  ASA: II  Anesthesia Plan: Epidural   Post-op Pain Management:    Induction:   PONV Risk Score and Plan:   Airway Management Planned:   Additional Equipment:   Intra-op Plan:   Post-operative Plan:   Informed Consent: I have reviewed the patients History and Physical, chart, labs and discussed the procedure including the risks, benefits and alternatives for the proposed anesthesia with the patient or authorized representative who has indicated his/her understanding  and acceptance.       Plan Discussed with:   Anesthesia Plan Comments: (  40 wk G7P1 for LEA)        Anesthesia Quick Evaluation

## 2020-09-14 NOTE — H&P (Signed)
OBSTETRIC ADMISSION HISTORY AND PHYSICAL  Andrea Carson is a 25 y.o. female [phone] with IUP at [redacted]w[redacted]d by 5 wk u/s presenting for eIOL. She reports +FMs, No LOF, no VB, no blurry vision, headaches or peripheral edema, and RUQ pain.  She plans on bottle feeding. She is undecided for birth control. She received her prenatal care at [facility] (transfer of care @34  weeks)   Dating: By 5 wk u/s --->  Estimated Date of Delivery: 09/14/20  Sono:    No visible reports on file, per patient reportedly normal   Prenatal History/Complications:  Transfer of care @34wks  COVID in pregnancy (06/15/20)   Past Medical History: Past Medical History:  Diagnosis Date  . BV (bacterial vaginosis)   . Constipation   . Eczema   . Eczema   . Environmental allergies   . Gall stones   . Heart murmur    pt reports as a child-but has resolved  . Infection    UTI  . Seasonal allergies     Past Surgical History: Past Surgical History:  Procedure Laterality Date  . ADENOIDECTOMY    . CHOLECYSTECTOMY N/A 07/31/2014   Procedure: LAPAROSCOPIC CHOLECYSTECTOMY WITH INTRAOPERATIVE CHOLANGIOGRAM;  Surgeon: 08/13/20, MD;  Location: [facility];  Service: General;  Laterality: N/A;  . THERAPEUTIC ABORTION     x3  . TONSILLECTOMY      Obstetrical History: OB History    Gravida  7   Para  1   Term  1   Preterm  0   AB  5   Living  1     SAB  0   IAB  5   Ectopic  0   Multiple  0   Live Births  1           Social History Social History   Socioeconomic History  . Marital status: Single    Spouse name: Not on file  . Number of children: Not on file  . Years of education: Not on file  . Highest education level: Not on file  Occupational History  . Not on file  Tobacco Use  . Smoking status: Never Smoker  . Smokeless tobacco: Never Used  Vaping Use  . Vaping Use: Former  Substance and Sexual Activity  . Alcohol use: Not Currently  . 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 Sexual activity: Yes    Birth control/protection: None  Other Topics Concern  . Not on file  Social History Narrative  . Not on file   Social Determinants of Health   Financial Resource Strain: Not on file  Food Insecurity: Not on file  Transportation Needs: Not on file  Physical Activity: Not on file  Stress: Not on file  Social Connections: Not on file    Family History: Family History  Problem Relation Age of Onset  . Hypertension Maternal Grandmother   . Cancer Maternal Grandmother   . Hypertension Mother   . Cancer Maternal Aunt   . Anxiety disorder Maternal Uncle   . Diabetes Paternal Grandmother     Allergies: Allergies  Allergen Reactions  . Bee Venom Anaphylaxis and Other (See Comments)    Pt has an epipen  . Fire 08/02/2014 Anaphylaxis  . Amoxicillin Rash    CHILDHOOD ALLERGY Has patient had a PCN reaction causing immediate rash, facial/tongue/throat swelling, SOB or lightheadedness with hypotension: Yes Has patient had a PCN reaction causing severe rash involving mucus  membranes or skin necrosis: No Has patient had a PCN reaction that required hospitalization No Has patient had a PCN reaction occurring within the last 10 years: No  If all of the above answers are "NO", then may proceed with Cephalosporin use.     Medications Prior to Admission  Medication Sig Dispense Refill Last Dose  . ondansetron (ZOFRAN ODT) 4 MG disintegrating tablet Take 1 tablet (4 mg total) by mouth every 6 (six) hours as needed for nausea. 20 tablet 0 Past Week at Unknown time  . pantoprazole (PROTONIX) 40 MG tablet TAKE 1 TABLET BY MOUTH EVERY DAY 30 tablet 1 09/13/2020 at Unknown time  . Prenatal Vit-Fe Fumarate-FA (PRENATAL MULTIVITAMIN) TABS tablet Take 1 tablet by mouth daily at 12 noon.   09/13/2020 at Unknown time  . cefadroxil (DURICEF) 500 MG capsule Take 1 capsule (500 mg total) by mouth 2 (two) times daily. (Patient not taking: No sig reported) 14 capsule 0  More than a month at Unknown time  . docusate sodium (COLACE) 100 MG capsule Take 1 capsule (100 mg total) by mouth 2 (two) times daily as needed. (Patient not taking: No sig reported) 30 capsule 2 More than a month at Unknown time  . Doxylamine-Pyridoxine (DICLEGIS PO) Take by mouth at bedtime. (Patient not taking: No sig reported)   More than a month at Unknown time  . famotidine (PEPCID) 40 MG tablet Take 40 mg by mouth 2 (two) times daily.   More than a month at Unknown time     Review of Systems   All systems reviewed and negative except as stated in HPI  Resp. rate 18, height 5' (1.524 m), weight 70.7 kg, last menstrual period 12/09/2019. General appearance: alert, cooperative and no distress Lungs: normal respiratory effort Heart: regular rate and rhythm Abdomen: soft, non-tender; gravid Pelvic: as noted below Extremities: Homans sign is negative, no sign of DVT Presentation: cephalic by RN exam Fetal monitoringBaseline: 130 bpm, Variability: Good {> 6 bpm), Accelerations: Reactive and Decelerations: Absent Uterine activityFrequency: Every 2-5 minutes Dilation: 3 Effacement (%): 80 Station: -2 Exam by:: J Mbugua RN   Prenatal labs: ABO, Rh:  O pos Antibody:  neg Rubella:  immune RPR:   neg HBsAg:   neg HIV:   neg GBS: Negative/-- (03/17 0425)  1 hr Glucola not done, late a1c normal Genetic screening normal (CareEverywhere) Anatomy US reportedly normal per patient  Prenatal Transfer Tool  Maternal Diabetes: No Genetic Screening: Normal Maternal Ultrasounds/Referrals: Normal per patient report Fetal Ultrasounds or other Referrals:  None Maternal Substance Abuse:  No Significant Maternal Medications:  None Significant Maternal Lab Results: Group B Strep negative  No results found for this or any previous visit (from the past 24 hour(s)).  Patient Active Problem List   Diagnosis Date Noted  . Labor and delivery indication for care or intervention 09/14/2020  .  Hx laparoscopic cholecystectomy 08/06/2020  . History of 2019 novel coronavirus disease (COVID-19) 08/06/2020  . Supervision of other normal pregnancy, antepartum 08/05/2020    Assessment/Plan:  Keiana Tavella is a 25 y.o. G7P1051 at [redacted]w[redacted]d here for eIOL.  #eIOL: Discussed IOL process with patient. Given cervical exam pitocin started, continue to titrate. #Pain: PRN #FWB:  cat 1 #ID: GBS neg #MOF: bottle #MOC: undecided, counseled #Circ: n/a #Late transfer of care: 34 weeks  Alric Seton, MD  09/14/2020, 1:32 AM

## 2020-09-14 NOTE — Discharge Summary (Signed)
Postpartum Discharge Summary       Patient Name: Andrea Carson DOB: 01-23-1996 MRN: [id number]  Date of admission: 09/14/2020 Delivery date:09/14/2020  Delivering provider: Arrie Senate  Date of discharge: 09/16/2020  Admitting diagnosis: Labor and delivery indication for care or intervention [O75.9] Intrauterine pregnancy: [redacted]w[redacted]d    Secondary diagnosis:  Active Problems:   Vaginal delivery   Labor and delivery indication for care or intervention   Elevated blood pressure affecting pregnancy in third trimester, antepartum   Mild preeclampsia  Additional problems: none    Discharge diagnosis: Term Pregnancy Delivered and Anemia                                              Post partum procedures:none Augmentation: Pitocin Complications: None  Hospital course: Induction of Labor With Vaginal Delivery   25y.o. yo [id number] admitted to the hospital 09/14/2020 for induction of labor.  Indication for induction: Elective.  Patient presented for IOL and was started on pitocin. Due to prolonged deceleration pitocin was stopped and ultimately restarted. Due to recurrent late and variable decels pitocin was again stopped and restarted. Patient ultimately progressed to complete and had an uncomplicated vaginal delivery. Patient had an uncomplicated labor course as follows: Membrane Rupture Time/Date: 5:20 AM ,09/14/2020   Delivery Method:Vaginal, Spontaneous  Episiotomy: None  Lacerations:  None  Details of delivery can be found in separate delivery note.  Patient had a routine postpartum course. Patient is discharged home 09/16/20.  Newborn Data: Birth date:09/14/2020  Birth time:11:05 PM  Gender:Female  Living status:Living  Apgars:9 ,9  Weight:2685 g   Magnesium Sulfate received: No BMZ received: No Rhophylac:N/A MMR:N/A T-DaP:Given prenatally Flu: No Transfusion:No  Physical exam  Vitals:   09/15/20 0558 09/15/20 1450 09/15/20 2339 09/16/20 0522  BP:  118/74 117/81 123/84   Pulse: 63 76 84 67  Resp: _0 Temp: 98.3 F (36.8 C) 97.8 F (36.6 C) 98.6 F (37 C) 98.2 F (36.8 C)  TempSrc: Oral Oral Oral Oral  SpO2: 100% 100%    Weight:      Height:       General: alert, cooperative and no distress Lochia: appropriate Uterine Fundus: firm Incision: N/A DVT Evaluation: No evidence of DVT seen on physical exam. Labs: Lab Results  Component Value Date   WBC 12.5 (H) 09/14/2020   HGB 10.5 (L) 09/14/2020   HCT 31.4 (L) 09/14/2020   MCV 96.6 09/14/2020   PLT 160 09/14/2020   CMP Latest Ref Rng & Units 09/14/2020  Glucose 70 - 99 mg/dL 97  BUN 6 - 20 mg/dL 7  Creatinine 0.44 - 1.00 mg/dL 0.80  Sodium 135 - 145 mmol/L 137  Potassium 3.5 - 5.1 mmol/L 3.7  Chloride 98 - 111 mmol/L 107  CO2 22 - 32 mmol/L 21(L)  Calcium 8.9 - 10.3 mg/dL 8.6(L)  Total Protein 6.5 - 8.1 g/dL 5.2(L)  Total Bilirubin 0.3 - 1.2 mg/dL 0.7  Alkaline Phos 38 - 126 U/L 123  AST 15 - 41 U/L 18  ALT 0 - 44 U/L 10   Edinburgh Score: Edinburgh Postnatal Depression Scale Screening Tool 09/15/2020  I have been able to laugh and see the funny side of things. 0  I have looked forward with enjoyment to things. 0  I have blamed myself unnecessarily when  things went wrong. 1  I have been anxious or worried for no good reason. 2  I have felt scared or panicky for no good reason. 1  Things have been getting on top of me. 0  I have been so unhappy that I have had difficulty sleeping. 0  I have felt sad or miserable. 1  I have been so unhappy that I have been crying. 1  The thought of harming myself has occurred to me. 0  Edinburgh Postnatal Depression Scale Total 6     After visit meds:  Allergies as of 09/16/2020      Reactions   Bee Venom Anaphylaxis, Other (See Comments)   Pt has an epipen   Fire Ant Anaphylaxis   Amoxicillin Rash   CHILDHOOD ALLERGY Has patient had a PCN reaction causing immediate rash, facial/tongue/throat swelling, SOB or  lightheadedness with hypotension: Yes Has patient had a PCN reaction causing severe rash involving mucus membranes or skin necrosis: No Has patient had a PCN reaction that required hospitalization No Has patient had a PCN reaction occurring within the last 10 years: No  If all of the above answers are "NO", then may proceed with Cephalosporin use.      Medication List    STOP taking these medications   cefadroxil 500 MG capsule Commonly known as: DURICEF   famotidine 40 MG tablet Commonly known as: PEPCID   ondansetron 4 MG disintegrating tablet Commonly known as: Zofran ODT   pantoprazole 40 MG tablet Commonly known as: PROTONIX     TAKE these medications   acetaminophen 325 MG tablet Commonly known as: Tylenol Take 2 tablets (650 mg total) by mouth every 4 (four) hours as needed (for pain scale < 4).   docusate sodium 100 MG capsule Commonly known as: COLACE Take 1 capsule (100 mg total) by mouth 2 (two) times daily as needed.   ibuprofen 600 MG tablet Commonly known as: ADVIL Take 1 tablet (600 mg total) by mouth every 6 (six) hours.   NIFEdipine 30 MG 24 hr tablet Commonly known as: PROCARDIA-XL/NIFEDICAL-XL Take 1 tablet (30 mg total) by mouth daily.   norethindrone 0.35 MG tablet Commonly known as: Ortho Micronor Take 1 tablet (0.35 mg total) by mouth daily.   prenatal multivitamin Tabs tablet Take 1 tablet by mouth daily at 12 noon.        Discharge home in stable condition Infant Feeding: Bottle and Breast Infant Disposition:home with mother Discharge instruction: per After Visit Summary and Postpartum booklet. Activity: Advance as tolerated. Pelvic rest for 6 weeks.  Diet: routine diet Future Appointments: Future Appointments  Date Time Provider Department Center  09/21/2020  1:20 PM [facility] NURSE [facility] None  10/14/2020  1:00 PM Nugent, Nicole E, NP [facility] None   Follow up Visit:  Follow-up Information    [facility] .   Specialty: Obstetrics and Gynecology Contact information: [address], [address] [place] [address] [phone]             Message sent to [facility] 09/14/20 by [employer].  Please schedule this patient for a In person postpartum visit in 6 weeks with the following provider: Any provider. Additional Postpartum F/U:BP check 1 week  High risk pregnancy complicated by: HTN Delivery mode:  Vaginal, Spontaneous  Anticipated Birth Control:  POPs   09/16/2020  M , MD   

## 2020-09-14 NOTE — Discharge Instructions (Signed)
-take tylenol 1000 mg every 6 hours as needed for pain, alternate with ibuprofen 600 mg every 6 hours -drink plenty of water to help with breastfeeding -continue prenatal vitamins while you are breastfeeding -think about birth control options-->bedisider.org is a great website! You can get any form of birth control from the health department for free   Postpartum Care After Vaginal Delivery The following information offers guidance about how to care for yourself from the time you deliver your baby to 6-12 weeks after delivery (postpartum period). If you have problems or questions, contact your health care provider for more specific instructions. Follow these instructions at home: Vaginal bleeding  It is normal to have vaginal bleeding (lochia) after delivery. Wear a sanitary pad for bleeding and discharge. ? During the first week after delivery, the amount and appearance of lochia is often similar to a menstrual period. ? Over the next few weeks, it will gradually decrease to a dry, yellow-brown discharge. ? For most women, lochia stops completely by 4-6 weeks after delivery, but can vary.  Change your sanitary pads frequently. Watch for any changes in your flow, such as: ? A sudden increase in volume. ? A change in color. ? Large blood clots.  If you pass a blood clot from your vagina, save it and call your health care provider. Do not flush blood clots down the toilet before talking with your health care provider.  Do not use tampons or douches until your health care provider approves.  If you are not breastfeeding, your period should return 6-8 weeks after delivery. If you are feeding your baby breast milk only, your period may not return until you stop breastfeeding. Perineal care  Keep the area between the vagina and the anus (perineum) clean and dry. Use medicated pads and pain-relieving sprays and creams as directed.  If you had a surgical cut in the perineum (episiotomy) or a  tear, check the area for signs of infection until you are healed. Check for: ? More redness, swelling, or pain. ? Fluid or blood coming from the cut or tear. ? Warmth. ? Pus or a bad smell.  You may be given a squirt bottle to use instead of wiping to clean the perineum area after you use the bathroom. Pat the area gently to dry it.  To relieve pain caused by an episiotomy, a tear, or swollen veins in the anus (hemorrhoids), take a warm sitz bath 2-3 times a day. In a sitz bath, the warm water should only come up to your hips and cover your buttocks.   Breast care  In the first few days after delivery, your breasts may feel heavy, full, and uncomfortable (breast engorgement). Milk may also leak from your breasts. Ask your health care provider about ways to help relieve the discomfort.  If you are breastfeeding: ? Wear a bra that supports your breasts and fits well. Use breast pads to absorb milk that leaks. ? Keep your nipples clean and dry. Apply creams and ointments as told. ? You may have uterine contractions every time you breastfeed for up to several weeks after delivery. This helps your uterus return to its normal size. ? If you have any problems with breastfeeding, notify your health care provider or lactation consultant.  If you are not breastfeeding: ? Avoid touching your breasts. Do not squeeze out (express) milk. Doing this can make your breasts produce more milk. ? Wear a good-fitting bra and use cold packs to help with swelling. Intimacy   and sexuality  Ask your health care provider when you can engage in sexual activity. This may depend upon: ? Your risk of infection. ? How fast you are healing. ? Your comfort and desire to engage in sexual activity.  You are able to get pregnant after delivery, even if you have not had your period. Talk with your health care provider about methods of birth control (contraception) or family planning if you desire future  pregnancies. Medicines  Take over-the-counter and prescription medicines only as told by your health care provider.  Take an over-the-counter stool softener to help ease bowel movements as told by your health care provider.  If you were prescribed an antibiotic medicine, take it as told by your health care provider. Do not stop taking the antibiotic even if you start to feel better.  Review all previous and current prescriptions to check for possible transfer into breast milk. Activity  Gradually return to your normal activities as told by your health care provider.  Rest as much as possible. Nap while your baby is sleeping. Eating and drinking  Drink enough fluid to keep your urine pale yellow.  To help prevent or relieve constipation, eat high-fiber foods every day.  Choose healthy eating to support breastfeeding or weight loss goals.  Take your prenatal vitamins until your health care provider tells you to stop.   General tips/recommendations  Do not use any products that contain nicotine or tobacco. These products include cigarettes, chewing tobacco, and vaping devices, such as e-cigarettes. If you need help quitting, ask your health care provider.  Do not drink alcohol, especially if you are breastfeeding.  Do not take medications or drugs that are not prescribed to you, especially if you are breastfeeding.  Visit your health care provider for a postpartum checkup within the first 3-6 weeks after delivery.  Complete a comprehensive postpartum visit no later than 12 weeks after delivery.  Keep all follow-up visits for you and your baby. Contact a health care provider if:  You feel unusually sad or worried.  Your breasts become red, painful, or hard.  You have a fever or other signs of an infection.  You have bleeding that is soaking through one pad an hour or you have blood clots.  You have a severe headache that doesn't go away or you have vision changes.  You  have nausea and vomiting and are unable to eat or drink anything for 24 hours. Get help right away if:  You have chest pain or difficulty breathing.  You have sudden, severe leg pain.  You faint or have a seizure.  You have thoughts about hurting yourself or your baby. If you ever feel like you may hurt yourself or others, or have thoughts about taking your own life, get help right away. Go to your nearest emergency department or:  Call your local emergency services (911 in the [place]  The National Suicide Prevention Lifeline at 1-800-273-8255. This suicide crisis helpline is open 24 hours a day.  Text the Crisis Text Line at 741741 (in the [place] Summary  The period of time after you deliver your newborn up to 6-12 weeks after delivery is called the postpartum period.  Keep all follow-up visits for you and your baby.  Review all previous and current prescriptions to check for possible transfer into breast milk.  Contact a health care provider if you feel unusually sad or worried during the postpartum period. This information is not intended to replace advice given to   you by your health care provider. Make sure you discuss any questions you have with your health care provider. Document Revised: 02/06/2020 Document Reviewed: 02/06/2020 Elsevier Patient Education  2021 Elsevier Inc.  

## 2020-09-14 NOTE — Consult Note (Signed)
  NICU delivery team requested to attend this vaginal delivery in Room 203.   Team arrived in the room and was excused immediately by L&D staff since infant just delivered and crying vigorously.   Overton Mam, MD (Attending Neonatologist)

## 2020-09-14 NOTE — Progress Notes (Signed)
Labor Progress Note Andrea Carson is a 25 y.o. [id number] at [redacted]w[redacted]d presented for eIOL. S: At bedside, position changes including hands and knees done to help with turning infant noted to be in ROP position at last check. Feeling comfortable with epidural. No vaginal pressure. Denies HA, vision changes, shortness of breath, abd pain.  O:  BP 123/82   Pulse 73   Temp 97.6 F (36.4 C) (Oral)   Resp 16   Ht 5' (1.524 m)   Wt 70.7 kg   LMP 12/09/2019   SpO2 100%   BMI 30.45 kg/m  At 920: EFM: baseline 110 bpm/mod variability/+ accels/recurrent variable/late decels with contractions Toco: q2-5 min  CVE: Dilation: 8 Effacement (%): 80 Cervical Position: Posterior Station: -1 Presentation: Vertex Exam by:: Mc Hollen, MD   A&P: 25 y.o. [id number] [redacted]w[redacted]d presented for eIOL. #IOL: SROM [id number]. Pitocin started 0050, stopped at 0930 for prolonged deceleration. Restarted at 1330, continue to titrate 1x1, pitocin currently at 3. SVE unchanged since this morning but has had break from pitocin due to decelerations. #Pain: epidural #FWB: cat 2, FSE/IUPC in place, continues to have moderate variability with + accels and recurrent late vs variable decels with contractions, overall reassuring with moderate variability and continuing to titrate pitocin at this time. #GBS negative  #Elevated blood pressures- several noted mild range blood pressures. Patient asymptomatic at this time. Pre-eclampsia labs ordered for evaluation. She is comfortable and not I npain. Continue to monitor BP closely at this time.  Billey Co, MD 4:10 PM

## 2020-09-14 NOTE — Anesthesia Procedure Notes (Signed)
Epidural Patient location during procedure: OB Start time: 09/14/2020 8:12 AM End time: 09/14/2020 8:25 AM  Staffing Anesthesiologist: Trevor Iha, MD Performed: anesthesiologist   Preanesthetic Checklist Completed: patient identified, IV checked, site marked, risks and benefits discussed, surgical consent, monitors and equipment checked, pre-op evaluation and timeout performed  Epidural Patient position: sitting Prep: DuraPrep and site prepped and draped Patient monitoring: continuous pulse ox and blood pressure Approach: midline Location: L3-L4 Injection technique: LOR air  Needle:  Needle type: Tuohy  Needle gauge: 17 G Needle length: 9 cm and 9 Needle insertion depth: 7 cm Catheter type: closed end flexible Catheter size: 19 Gauge Catheter at skin depth: 12 cm Test dose: negative  Assessment Events: blood not aspirated, injection not painful, no injection resistance, no paresthesia and negative IV test  Additional Notes Patient identified. Risks/Benefits/Options discussed with patient including but not limited to bleeding, infection, nerve damage, paralysis, failed block, incomplete pain control, headache, blood pressure changes, nausea, vomiting, reactions to medication both or allergic, itching and postpartum back pain. Confirmed with bedside nurse the patient's most recent platelet count. Confirmed with patient that they are not currently taking any anticoagulation, have any bleeding history or any family history of bleeding disorders. Patient expressed understanding and wished to proceed. All questions were answered. Sterile technique was used throughout the entire procedure. Please see nursing notes for vital signs. Test dose was given through epidural needle and negative prior to continuing to dose epidural or start infusion. Warning signs of high block given to the patient including shortness of breath, tingling/numbness in hands, complete motor block, or any  concerning symptoms with instructions to call for help. Patient was given instructions on fall risk and not to get out of bed. All questions and concerns addressed with instructions to call with any issues.1  Attempt (S) . Patient tolerated procedure well.

## 2020-09-14 NOTE — Progress Notes (Signed)
Labor Progress Note Andrea Carson is a 25 y.o. [id number] at [redacted]w[redacted]d presented for eIOL. S: Doing well, feeling contractions.  O:  BP (!) 142/85   Pulse 66   Temp 98.6 F (37 C) (Oral)   Resp 16   Ht 5' (1.524 m)   Wt 70.7 kg   LMP 12/09/2019   BMI 30.45 kg/m  EFM: baseline 130bpm/mod variability/+ accels/recurrent variable decels with contractions Toco: q1-2 min  CVE: Dilation: 8 Effacement (%): 90 Cervical Position: Posterior Station: 0 Presentation: Vertex Exam by:: Dr. Germaine Pomfret   A&P: 25 y.o. [id number] [redacted]w[redacted]d presented for eIOL. #IOL: Pitocin started @0050 . SROM @0520  with clear fluid. Patient has made rapid cervical change. #Pain: PRN #FWB: cat 2, overall reassuring in the setting of rapid cervical change #GBS negative  , MD 6:45 AM

## 2020-09-14 NOTE — Progress Notes (Addendum)
Labor Progress Note Andrea Carson is a 25 y.o. [id number] at [redacted]w[redacted]d presented for eIOL. S: At bedside at [phone] as patient was having recurrent variable decelerations, with moderate variability and return to baseline 120s. She just received her epidural an hour ago and is feeling more comfortable.  O:  BP 108/86   Pulse (!) 107   Temp 98.6 F (37 C) (Oral)   Resp 16   Ht 5' (1.524 m)   Wt 70.7 kg   LMP 12/09/2019   SpO2 100%   BMI 30.45 kg/m  At 920: EFM: baseline 120 bpm/mod variability/+ accels/recurrent variable decels with contractions Toco: q1-2 min  CVE: Dilation: 8 (swollen) Effacement (%): 80 (swollen) Cervical Position: Posterior Station: -1 Presentation: Vertex (asynclitic) Exam by:: Dr. Myriam Jacobson   A&P: 25 y.o. [id number] [redacted]w[redacted]d presented for eIOL. #IOL: Patient had a prolonged decel at 0928 to the 80s, FSE placed, did not resolve with IVF bolus and position changes thus pitocen stopped and terbutaline x1 given. SVE unchanged 8/80/0, cervix noted to be swollen.  Patient returned to baseline at 120s with moderate variability + accels, but intermittent late and variable decels, decided to continue to monitor off of pitocin with plan to restart in 20-30 minutes if variability remained reassuring.  At bedside with Dr Myriam Jacobson at [phone], patient baseline noted to be lower at 110s with intermittent late decels, SVE unchanged 8/80/-1 with swollen cervix, pitocin has not yet been restarted, IUPC placed and FSE replaced. Blood pressures noted to be lower in 110s/60s than prior were in 120s/80s thus phenylephrine x1 ordered. Also given 25 mg IV benadryl x1 for cervical swelling. Contractions noted on IUPC to be every 2-4 minutes, weak in intensity.   #Pain: epidural #FWB: cat 2, continuing to monitor closely off of pitocin at this time, overall reassuring with moderate variability + accels #GBS negative  Billey Co, MD 11:09 AM

## 2020-09-14 NOTE — Progress Notes (Signed)
Labor Progress Note Andrea Carson is a 25 y.o. [id number] at [redacted]w[redacted]d presented for eIOL. S: Doing well without complaints. Family and patient with multiple questions regarding length of IOL process and length of treatment with pitocin.  O:  BP 118/74   Pulse 83   Temp 99.3 F (37.4 C) (Axillary)   Resp 18   Ht 5' (1.524 m)   Wt 70.7 kg   LMP 12/09/2019   SpO2 99%   BMI 30.45 kg/m  EFM: baseline 125bpm/mod variability/+ accels/no decels Toco: q1-4 min  CVE: Dilation: 8.5 Effacement (%): 90 Cervical Position: Posterior Station: 0 Presentation: Vertex Exam by:: Marsala, MD   A&P: 25 y.o. [id number] [redacted]w[redacted]d presented for eIOL. #IOL: Pitocin started @0050  4/11. SROM @0520  with clear fluid. Pitocin was stopped at 0930 due to prolonged decel and recurrent variable/late decels, IUPC/FSE placed at that time. Restarted at 1310 and then around 1630 patient with recurrent lates and pitocin was again stopped. After fetal recovery pitocin was restarted at 1840 and is currently @3mL /hr. Continue to titrate up 1x1. Case discussed with Dr. 6/11 who is in agreement with plan. Discussed extensively the plan with patient and family members.  #Pain: PRN #FWB: cat 1 #GBS negative  , MD 9:10 PM

## 2020-09-14 NOTE — Progress Notes (Signed)
At bedside 1628 for recurrent decelerations. Baseline 115s, moderate variability, with recurrent late decels to 80s/90s with slow return to baseline. Pitocin stopped. Repeat SVE 8/80/0, still noted to be ROP. Due to prolonged decel terbutaline x1 given. Dr Crissie Reese updated, came to bedside.  FHT returned to baseline 115, moderate variability, no decelerations off of pitocin and s/p terbutaline.  Discussed with patient option of electing for trial off of pitocin, position changes, vs Cesarean section. She would like to continue to try for a vaginal delivery. We explained her baby looks well when not having stronger contractions on pitocin, and that it is reasonable to try position changes to get baby to change from ROP position as she has delivered vaginally before. Answered all questions and concerns.  A/P: Plan to start amnioinfusion in case cord compression contributing to decelerations, and then restart pitocin 1x1 once amnioinfusion bolus in place. Titrate pitocin as tolerated.  Burley Saver MD

## 2020-09-15 ENCOUNTER — Inpatient Hospital Stay (HOSPITAL_COMMUNITY): Admission: AD | Admit: 2020-09-15 | Payer: Medicaid Other | Source: Home / Self Care | Admitting: Obstetrics

## 2020-09-15 ENCOUNTER — Inpatient Hospital Stay (HOSPITAL_COMMUNITY): Payer: Medicaid Other

## 2020-09-15 ENCOUNTER — Encounter (HOSPITAL_COMMUNITY): Payer: Self-pay | Admitting: Family Medicine

## 2020-09-15 DIAGNOSIS — O1400 Mild to moderate pre-eclampsia, unspecified trimester: Secondary | ICD-10-CM | POA: Diagnosis not present

## 2020-09-15 DIAGNOSIS — O1405 Mild to moderate pre-eclampsia, complicating the puerperium: Secondary | ICD-10-CM | POA: Diagnosis not present

## 2020-09-15 MED ORDER — IBUPROFEN 600 MG PO TABS
600.0000 mg | ORAL_TABLET | Freq: Four times a day (QID) | ORAL | Status: DC
  Administered 2020-09-15 – 2020-09-16 (×5): 600 mg via ORAL
  Filled 2020-09-15 (×6): qty 1

## 2020-09-15 MED ORDER — DIBUCAINE (PERIANAL) 1 % EX OINT: 1.0000 "application " | TOPICAL_OINTMENT | CUTANEOUS | Status: DC | PRN

## 2020-09-15 MED ORDER — ACETAMINOPHEN 325 MG PO TABS
650.0000 mg | ORAL_TABLET | ORAL | Status: DC | PRN
  Administered 2020-09-15 – 2020-09-16 (×3): 650 mg via ORAL
  Filled 2020-09-15 (×3): qty 2

## 2020-09-15 MED ORDER — ONDANSETRON HCL 4 MG PO TABS: 4.0000 mg | ORAL_TABLET | ORAL | Status: DC | PRN

## 2020-09-15 MED ORDER — ONDANSETRON HCL 4 MG/2ML IJ SOLN: 4.0000 mg | INTRAMUSCULAR | Status: DC | PRN

## 2020-09-15 MED ORDER — TETANUS-DIPHTH-ACELL PERTUSSIS 5-2.5-18.5 LF-MCG/0.5 IM SUSY: 0.5000 mL | PREFILLED_SYRINGE | Freq: Once | INTRAMUSCULAR | Status: DC

## 2020-09-15 MED ORDER — PRENATAL MULTIVITAMIN CH
1.0000 | ORAL_TABLET | Freq: Every day | ORAL | Status: DC
  Administered 2020-09-15 – 2020-09-16 (×2): 1 via ORAL
  Filled 2020-09-15 (×2): qty 1

## 2020-09-15 MED ORDER — SENNOSIDES-DOCUSATE SODIUM 8.6-50 MG PO TABS
2.0000 | ORAL_TABLET | ORAL | Status: DC
  Administered 2020-09-15 – 2020-09-16 (×2): 2 via ORAL
  Filled 2020-09-15 (×2): qty 2

## 2020-09-15 MED ORDER — BENZOCAINE-MENTHOL 20-0.5 % EX AERO: 1.0000 "application " | INHALATION_SPRAY | CUTANEOUS | Status: DC | PRN

## 2020-09-15 MED ORDER — MEASLES, MUMPS & RUBELLA VAC IJ SOLR: 0.5000 mL | Freq: Once | INTRAMUSCULAR | Status: DC

## 2020-09-15 MED ORDER — SIMETHICONE 80 MG PO CHEW: 80.0000 mg | CHEWABLE_TABLET | ORAL | Status: DC | PRN

## 2020-09-15 MED ORDER — DIPHENHYDRAMINE HCL 25 MG PO CAPS: 25.0000 mg | ORAL_CAPSULE | Freq: Four times a day (QID) | ORAL | Status: DC | PRN

## 2020-09-15 MED ORDER — WITCH HAZEL-GLYCERIN EX PADS: 1.0000 "application " | MEDICATED_PAD | CUTANEOUS | Status: DC | PRN

## 2020-09-15 MED ORDER — COCONUT OIL OIL: 1.0000 "application " | TOPICAL_OIL | Status: DC | PRN

## 2020-09-15 NOTE — Lactation Note (Signed)
This note was copied from a baby's chart. Lactation Consultation Note Baby 23 hrs old. Lactation order put in recently. LC attempted to see mom. Mom stated the baby was sleeping right now. Asked mom to call for [facility] for next feeding please.  Patient Name: Andrea Carson [id number] Date: 09/15/2020   Age:24 hours  Maternal Data    Feeding    LATCH Score                    Lactation Tools Discussed/Used    Interventions    Discharge    Consult Status      Charyl Dancer 09/15/2020, 10:41 PM

## 2020-09-15 NOTE — Anesthesia Postprocedure Evaluation (Signed)
Anesthesia Post Note  Patient: Andrea Carson  Procedure(s) Performed: AN AD HOC LABOR EPIDURAL     Patient location during evaluation: Mother Baby Anesthesia Type: Epidural Level of consciousness: awake and alert Pain management: pain level controlled Vital Signs Assessment: post-procedure vital signs reviewed and stable Respiratory status: spontaneous breathing, nonlabored ventilation and respiratory function stable Cardiovascular status: stable Postop Assessment: no headache, no backache and epidural receding Anesthetic complications: no   No complications documented.  Last Vitals:  Vitals:   09/15/20 0155 09/15/20 0558  BP: (!) 144/89 118/74  Pulse: 74 63  Resp: 18 18  Temp: 37 C 36.8 C  SpO2: 100% 100%    Last Pain:  Vitals:   09/15/20 0848  TempSrc:   PainSc: 5    Pain Goal:                   Andrea Carson

## 2020-09-15 NOTE — Clinical Social Work Maternal (Addendum)
CLINICAL SOCIAL WORK MATERNAL/CHILD NOTE  Patient Details  Name: Andrea Carson MRN: [id number] Date of Birth: Dec 25, 1995  Date:  09/15/2020  Clinical Social Worker Initiating Note:  Kathrin Greathouse, [place] Date/Time: Initiated:  09/15/20/1127     Child's Name:  Andrea Carson   Biological Parents:  Mother,Father   Need for Interpreter:  None   Reason for Referral:  Current Substance Use/Substance Use During Pregnancy    Address:  [place] [address]    Phone number:  [phone] (home)     Additional phone number:   Household Members/Support Persons (HM/SP):   Household Member/Support Person 1,Household Member/Support Person 2   HM/SP Name Relationship DOB or Age  HM/SP -1 Rodney Booze Significant Other 04-10-1997  HM/SP -2 Chaney Ingram 05-31-14 Child  HM/SP -3        HM/SP -4        HM/SP -5        HM/SP -6        HM/SP -7        HM/SP -8          Natural Supports (not living in the home):  Extended Family,Immediate Family   Professional Supports:     Employment: Unemployed   Type of Work:     Education:  Programmer, systems   Homebound arranged:    Museum/gallery curator Resources:  Medicaid   Other Resources:    WIC/FS  Cultural/Religious Considerations Which May Impact Care:    Strengths:  Ability to meet basic needs ,Home prepared for child ,Pediatrician chosen   Psychotropic Medications:         Pediatrician:    [place]  Pediatrician List:   [facility] for [place]      Pediatrician Fax Number:    Risk Factors/Current Problems:  Substance Use    Cognitive State:  Alert ,Insightful ,Able to Concentrate ,Linear Thinking    Mood/Affect:  Calm ,Comfortable ,Bright ,Happy    CSW Assessment: CSW received consult for hx of anxiety and substance exposed newborn.  CSW met with MOB to offer support and  complete assessment.     CSW met with the infant at bedside. CSW introduced role and congratulated MOB. CSW observed MOB holding and bonding with infant. CSW explain the reason for the visit. MOB presented pleasant and receptive to [employer] visit.  CSW asked how MOB feels since giving birth. MOB reports, "I am feeling good." CSW inquired about MOB pregnancy. MOB reports, "it was ok. It was a pregnancy."   CSW asked MOB about her history of anxiety. MOB reports she was diagnosed with anxiety about two years ago. MOB reports her anxiety was situational. MOB reports she was prescribed two medications at the time however MOB could not recall the names of the medications. MOB reports she has not taken the medication in two years and no anxiety symptoms. MOB reports no history or current use of therapy. CSW assessed MOB for safety, MOB reports no thoughts of harm to self or others. CSW provided education regarding the baby blues period vs. perinatal mood disorders, discussed treatment and gave resources for mental health follow up if concerns arise. MOB receptive to the resources.  CSW recommended MOB complete a self-evaluation during the postpartum time period using the New Mom Checklist from Postpartum Progress and encouraged MOB to contact a medical professional  if symptoms are noted at any time. MOB receptive to the check list.   CSW asked MOB if she used substances during her pregnancy. MOB reports, "I did at first, in August and September. I stopped at the end of September because the hospital (in [place], [place]) prescribed medication for my hyperemesis." CSW inquired about substance choice, MOB reports only using marijuana. CSW informed MOB about the hospital drug screen policy and that CSW will follow UDS and CDS. CSW will notify CPS if warranted. MOB reports her mother who is also a Education officer, museum in [place], [place], encouraged MOB not to smoke during her pregnancy. CSW asked MOB if she had questions about the  policy. MOB questioned why the infant's cord is also tested. CSW explain the policy for CDS. MOB reports understanding.   CSW asked MOB about her supports. MOB reports, FOB, mother, FOB mother and relatives have been very supportive. CSW provided review of Sudden Infant Death Syndrome (SIDS) precautions and informed MOB no-co sleeping with the infant. MOB reports her mother also educated her about SIDS and dangers of co-sleeping with the infant. CSW asked MOB if she has a safe sleep space for the infant. MOB reports she has a bassinet for the infant. CSW asked if MOB has essential items for the infant. MOB reports she has all items for the infant, including a car seat. MOB chose pediatrician Octavia Bruckner and [place] CSW assessed for additional needs. MOB reports no further need.   CSW identifies no further need for intervention and no barriers to discharge at this time.  CSW Plan/Description:  No Further Intervention Required/No Barriers to [place] Will Continue to Monitor Umbilical Cord Tissue Drug Screen Results and Make Report if [facility] Drug Screen Policy Information,Child Protective Service Report ,Perinatal Mood and Anxiety Disorder (PMADs) Education,Sudden Infant Death Syndrome (SIDS) Education    Lia Hopping, LCSW 09/15/2020, 11:36 AM

## 2020-09-15 NOTE — Progress Notes (Signed)
Post Partum Day 1 Subjective: Patient is doing well without complaints. Ambulating without difficulty. Voiding and passing flatus. Tolerating PO. Abdominal pain improved. Vaginal bleeding decreased.  Objective: Blood pressure 118/74, pulse 63, temperature 98.3 F (36.8 C), temperature source Oral, resp. rate 18, height 5' (1.524 m), weight 70.7 kg, last menstrual period 12/09/2019, SpO2 100 %, unknown if currently breastfeeding.  Physical Exam:  General: alert, cooperative and no distress Lochia: appropriate Uterine Fundus: firm Incision: n/a DVT Evaluation: No evidence of DVT seen on physical exam.  Recent Labs    09/14/20 0012 09/14/20 1632  HGB 10.8* 10.5*  HCT 31.4* 31.4*    Assessment/Plan: PPD#1 VD  -doing well, meeting pp milestones  -breast and bottle feeding  -undecided on contraception, discuss again prior to discharge  #PreE w/o SF  -BP elevated to 130s/140s previously, last BP 118/74  -asymptomatic  -continue to monitor and start norvasc 5mg  if BP remains elevated  Plan to discharge tomorrow.   LOS: 1 day   09/15/2020, 6:18 AM

## 2020-09-16 ENCOUNTER — Other Ambulatory Visit (HOSPITAL_COMMUNITY): Payer: Self-pay

## 2020-09-16 DIAGNOSIS — O1405 Mild to moderate pre-eclampsia, complicating the puerperium: Secondary | ICD-10-CM | POA: Diagnosis not present

## 2020-09-16 MED ORDER — NORETHINDRONE 0.35 MG PO TABS
1.0000 | ORAL_TABLET | Freq: Every day | ORAL | 11 refills | Status: DC
  Filled 2020-09-16: qty 28, 28d supply, fill #0

## 2020-09-16 MED ORDER — IBUPROFEN 600 MG PO TABS
600.0000 mg | ORAL_TABLET | Freq: Four times a day (QID) | ORAL | 0 refills | Status: DC
  Filled 2020-09-16: qty 30, 8d supply, fill #0

## 2020-09-16 MED ORDER — NIFEDIPINE ER 30 MG PO TB24
30.0000 mg | ORAL_TABLET | Freq: Every day | ORAL | 0 refills | Status: DC
  Filled 2020-09-16: qty 30, 30d supply, fill #0

## 2020-09-16 MED ORDER — ACETAMINOPHEN 325 MG PO TABS
650.0000 mg | ORAL_TABLET | ORAL | 0 refills | Status: DC | PRN
  Filled 2020-09-16: qty 30, 3d supply, fill #0

## 2020-09-16 NOTE — Lactation Note (Signed)
This note was copied from a baby's chart. Lactation Consultation Note Baby is 35 hrs old when started consult. Mom had baby latched. LC just made suggestion to have baby closer so cheeks touch breast. LC placed blanket under mom's arm for supporting baby's head so baby wouldn't be pulling on breast making them sore.  Mom came in formula feeding but has changed to Breast feeding d/t mom stated baby doesn't like the bottle. Mom is doing really good at breast feeding not to have ever BF before. Mom denies painful latch.  Newborn behavior, feeding habits, STS, I&O, breast massage, supply and demand discussed. LPI information given d/t SGA less than 6 lbs. Discussed supplementing w/EBM.  Suggested mom to use DEBP mom declined. Stated she will pump when she gets home.  Mom encouraged to feed baby 8-12 times/24 hours and with feeding cues.  Encouraged mom to call for assistance or questions.  Lactation brochure given.  Patient Name: Andrea Carson Today's Date: 09/16/2020 Reason for consult: Initial assessment;1st time breastfeeding;Term Age:81 hours  Maternal Data Has patient been taught Hand Expression?: Yes Does the patient have breastfeeding experience prior to this delivery?: No  Feeding Mother's Current Feeding Choice: Breast Milk and Formula  LATCH Score Latch: Grasps breast easily, tongue down, lips flanged, rhythmical sucking.  Audible Swallowing: A few with stimulation  Type of Nipple: Everted at rest and after stimulation  Comfort (Breast/Nipple): Soft / non-tender  Hold (Positioning): No assistance needed to correctly position infant at breast.  LATCH Score: 9   Lactation Tools Discussed/Used Tools:  (declined pumping)  Interventions Interventions: Breast feeding basics reviewed;Support pillows;Assisted with latch;Position options;Skin to skin;Breast massage;Hand express;Breast compression  Discharge Pump: Declined WIC Program: No  Consult Status Consult  Status: Follow-up Date: 09/16/20 Follow-up type: In-patient    Charyl Dancer 09/16/2020, 12:25 AM

## 2020-09-16 NOTE — Progress Notes (Addendum)
Post Partum Day 2  Subjective: Patient is doing well without complaints. Ambulating without difficulty. Voiding and passing flatus. Tolerating PO. Abdominal pain improved. Vaginal bleeding decreased. No headaches or dizziness.   Objective: Blood pressure 123/84, pulse 67, temperature 98.2 F (36.8 C), temperature source Oral, resp. rate 16, height 5' (1.524 m), weight 70.7 kg, last menstrual period 12/09/2019, SpO2 100 %, unknown if currently breastfeeding.   7:45 AM: Blood Pressure - 131/86, 138/104  Physical Exam:  General: alert, cooperative and no distress Lochia: appropriate Uterine Fundus: firm Incision: n/a DVT Evaluation: No evidence of DVT seen on physical exam.  Recent Labs    09/14/20 0012 09/14/20 1632  HGB 10.8* 10.5*  HCT 31.4* 31.4*    Assessment/Plan:  PPD#1 VD -doing well, meeting pp milestones -breast and bottle feeding  #MOC: micronor 0.35 mg  #Health Maintenance: MMR and Tdap given #PreE w/o SF -BP elevated to 130s/140s previously, two separate measurements this morning >130/85 -asymptomatic -discharge on 30 mg nifedipine    Plan to discharge today   LOS: 2 days   Jule Economy 09/16/2020, 7:06 AM   I saw and evaluated the patient. I agree with the findings and the plan of care as documented in the medical student's note.  Sharene Skeans, MD [facility] Fellow, [facility] for [facility], [place]

## 2020-09-16 NOTE — Lactation Note (Signed)
This note was copied from a baby's chart. Lactation Consultation Note  Patient Name: Andrea Carson POEUM'P Date: 09/16/2020 Reason for consult: Follow-up assessment;Term;Infant < 6lbs;Infant weight loss Age:24 hours-  Per mom the baby cluster fed last night and had a bottle early this and  and has been sleepy since.  LC offered to change the stool diaper - noted the stool is transitioning .  During the diaper change baby less sleepy and  Rooting.  LC assisted mom with the pillow support and guiding her with the cross cradle.  Depth obtained , swallows increased and baby fed for 10 mins and released on her own and was satisfied/ mom comfortable.  Since the weight loss is only 3 % , LC recommended waiting a few minutes and the baby may want to feed again and offer the right breast.  LC recommended since the baby is less than 6 pounds , if the baby has a sluggish feeding to feed at the breast 1st and supplement up to 30 ml.  Per mom plans to buy a DEBP today.  LC provided mom a hand pump with #24 F and #27 F. See D/C teaching below.  Mom has the [facility] brochure with resource numbers.  Latch score of 9   Maternal Data Has patient been taught Hand Expression?: Yes Does the patient have breastfeeding experience prior to this delivery?: Yes How long did the patient breastfeed?: per mom 1st 3 months , also per mom did pumping  Feeding Mother's Current Feeding Choice: Breast Milk and Formula  LATCH Score Latch: Grasps breast easily, tongue down, lips flanged, rhythmical sucking.  Audible Swallowing: Spontaneous and intermittent  Type of Nipple: Everted at rest and after stimulation  Comfort (Breast/Nipple): Soft / non-tender  Hold (Positioning): Assistance needed to correctly position infant at breast and maintain latch.  LATCH Score: 9   Lactation Tools Discussed/Used Tools: Pump;Flanges Flange Size: 24;27 Breast pump type: Manual Pump Education: Milk  Storage  Interventions Interventions: Breast feeding basics reviewed;Assisted with latch;Skin to skin;Breast massage;Breast compression;Adjust position;Support pillows;Hand pump;Education  Discharge Discharge Education: Engorgement and breast care;Warning signs for feeding baby Pump: Manual (per mom plans to buy a DEBP)  Consult Status Consult Status: Complete Date: 09/16/20    Matilde Sprang Jerick Khachatryan 09/16/2020, 10:09 AM

## 2020-09-21 ENCOUNTER — Other Ambulatory Visit: Payer: Self-pay

## 2020-09-21 ENCOUNTER — Ambulatory Visit (INDEPENDENT_AMBULATORY_CARE_PROVIDER_SITE_OTHER): Payer: Medicaid Other

## 2020-09-21 VITALS — BP 124/86 | HR 89 | Wt 140.0 lb

## 2020-09-21 DIAGNOSIS — Z0130 Encounter for examination of blood pressure without abnormal findings: Secondary | ICD-10-CM

## 2020-09-21 NOTE — Progress Notes (Signed)
Subjective:  Andrea Carson is a 25 y.o. female here for BP check s/p NSVD on 09/14/2020.  Hypertension ROS: Pt admits to not taking BP meds today. Last dose was yesterday at 1 pm, no medication side effects noted, no TIA's, no chest pain on exertion, no dyspnea on exertion and no swelling of ankles.    Objective:  BP 124/86   Pulse 89   Wt 140 lb (63.5 kg)   LMP 12/09/2019   BMI 27.34 kg/m   Appearance alert, well appearing, and in no distress. General exam BP noted to be well controlled today in office.    Assessment:   Blood Pressure well controlled.   Plan:  Continue BP meds daily as directed. Keep upcoming routine postpartum visit.

## 2020-09-21 NOTE — Progress Notes (Signed)
Patient was assessed and managed by nursing staff during this encounter. I have reviewed the chart and agree with the documentation and plan. I have also made any necessary editorial changes.  Warden Fillers, MD 09/21/2020 2:42 PM

## 2020-10-14 ENCOUNTER — Encounter: Payer: Self-pay | Admitting: Women's Health

## 2020-10-14 ENCOUNTER — Ambulatory Visit: Payer: Medicaid Other | Admitting: Women's Health

## 2020-11-17 ENCOUNTER — Ambulatory Visit (INDEPENDENT_AMBULATORY_CARE_PROVIDER_SITE_OTHER): Payer: Medicaid Other | Admitting: Obstetrics

## 2020-11-17 ENCOUNTER — Other Ambulatory Visit (HOSPITAL_COMMUNITY)
Admission: RE | Admit: 2020-11-17 | Discharge: 2020-11-17 | Disposition: A | Discharge status: Home / Self Care | Payer: Medicaid Other | Source: Ambulatory Visit | Attending: Obstetrics | Admitting: Obstetrics

## 2020-11-17 ENCOUNTER — Other Ambulatory Visit: Payer: Self-pay

## 2020-11-17 ENCOUNTER — Encounter: Payer: Self-pay | Admitting: Obstetrics

## 2020-11-17 DIAGNOSIS — N898 Other specified noninflammatory disorders of vagina: Secondary | ICD-10-CM

## 2020-11-17 DIAGNOSIS — N760 Acute vaginitis: Secondary | ICD-10-CM

## 2020-11-17 DIAGNOSIS — B9689 Other specified bacterial agents as the cause of diseases classified elsewhere: Secondary | ICD-10-CM | POA: Diagnosis not present

## 2020-11-17 DIAGNOSIS — E569 Vitamin deficiency, unspecified: Secondary | ICD-10-CM | POA: Diagnosis not present

## 2020-11-17 DIAGNOSIS — B373 Candidiasis of vulva and vagina: Secondary | ICD-10-CM | POA: Diagnosis not present

## 2020-11-17 DIAGNOSIS — B3731 Acute candidiasis of vulva and vagina: Secondary | ICD-10-CM

## 2020-11-17 DIAGNOSIS — Z3009 Encounter for other general counseling and advice on contraception: Secondary | ICD-10-CM | POA: Diagnosis not present

## 2020-11-17 DIAGNOSIS — Z30011 Encounter for initial prescription of contraceptive pills: Secondary | ICD-10-CM | POA: Diagnosis not present

## 2020-11-17 MED ORDER — FLUCONAZOLE 150 MG PO TABS: 150.0000 mg | ORAL_TABLET | Freq: Once | ORAL | 2 refills | Status: AC

## 2020-11-17 MED ORDER — METRONIDAZOLE 500 MG PO TABS: 500.0000 mg | ORAL_TABLET | Freq: Two times a day (BID) | ORAL | 2 refills | Status: DC

## 2020-11-17 MED ORDER — VITAFOL ULTRA 29-0.6-0.4-200 MG PO CAPS: 1.0000 | ORAL_CAPSULE | Freq: Every day | ORAL | 4 refills | Status: DC

## 2020-11-17 MED ORDER — LO LOESTRIN FE 1 MG-10 MCG / 10 MCG PO TABS
1.0000 | ORAL_TABLET | Freq: Every day | ORAL | 11 refills | Status: DC
Start: 2020-11-17 — End: 2021-03-31

## 2020-11-17 NOTE — Progress Notes (Signed)
Would like to discuss birth control today. Would like to be "checked for infections", reports vagina is raw.

## 2020-11-17 NOTE — Progress Notes (Signed)
Post Partum Visit Note  Andrea Carson is a 25 y.o. [phone] female who presents for a postpartum visit. She is 8 weeks postpartum following a normal spontaneous vaginal delivery.  I have fully reviewed the prenatal and intrapartum course. The delivery was at 40 gestational weeks.  Anesthesia: epidural. Postpartum course has been complicated by elevated BP. Baby is doing well. Baby is feeding by bottle - gerber good start . Bleeding staining only. Bowel function is normal. Bladder function is normal. Patient is sexually active. Contraception method is none. Postpartum depression screening: negative.   The pregnancy intention screening data noted above was reviewed. Potential methods of contraception were discussed. The patient elected to proceed with Oral Contraceptive.    Edinburgh Postnatal Depression Scale - 11/17/20 1332       Edinburgh Postnatal Depression Scale:  In the Past 7 Days   I have been able to laugh and see the funny side of things. 0    I have looked forward with enjoyment to things. 0    I have blamed myself unnecessarily when things went wrong. 0    I have been anxious or worried for no good reason. 2    I have felt scared or panicky for no good reason. 2    Things have been getting on top of me. 2    I have been so unhappy that I have had difficulty sleeping. 0    I have felt sad or miserable. 2    I have been so unhappy that I have been crying. 0    The thought of harming myself has occurred to me. 0    Edinburgh Postnatal Depression Scale Total 8             Health Maintenance Due  Topic Date Due   COVID-19 Vaccine (1) Never done   HPV VACCINES (1 - 2-dose series) Never done   Hepatitis C Screening  Never done   PAP-Cervical Cytology Screening  Never done   PAP SMEAR-Modifier  Never done    The following portions of the patient's history were reviewed and updated as appropriate: allergies, current medications, past family history, past medical history,  past social history, past surgical history, and problem list.  Review of Systems A comprehensive review of systems was negative.  Objective:  BP 111/81   Pulse (!) 57   Ht 5' (1.524 m)   Wt 148 lb 9.6 oz (67.4 kg)   LMP 12/09/2019   BMI 29.02 kg/m    General:  alert and no distress   Breasts:  normal  Lungs: clear to auscultation bilaterally  Heart:  regular rate and rhythm, S1, S2 normal, no murmur, click, rub or gallop  Abdomen: soft, non-tender; bowel sounds normal; no masses,  no organomegaly   Wound No lacerations  GU exam:  normal       Assessment:    1. Postpartum care following vaginal delivery - doing well  2. Vaginal discharge Rx: - Cervicovaginal ancillary only( [place])  3. Encounter for counseling regarding contraception - wants OCP's  4. Encounter for initial prescription of contraceptive pills Rx: - LO LOESTRIN FE 1 MG-10 MCG / 10 MCG tablet; Take 1 tablet by mouth daily.  Dispense: 28 tablet; Refill: 11  5. BV (bacterial vaginosis) Rx: - metroNIDAZOLE (FLAGYL) 500 MG tablet; Take 1 tablet (500 mg total) by mouth 2 (two) times daily.  Dispense: 14 tablet; Refill: 2  6. Candida vaginitis Rx: - fluconazole (DIFLUCAN) 150 MG  tablet; Take 1 tablet (150 mg total) by mouth once for 1 dose.  Dispense: 1 tablet; Refill: 2  7. Vitamin deficiency Rx: - Prenat-Fe Poly-Methfol-FA-DHA (VITAFOL ULTRA) 29-0.6-0.4-200 MG CAPS; Take 1 capsule by mouth daily before breakfast.  Dispense: 90 capsule; Refill: 4    Plan:   Essential components of care per ACOG recommendations:  1.  Mood and well being: Patient with negative depression screening today.  - Patient tobacco use? No.   - hx of drug use? No.    2. Infant care and feeding:  -Patient currently breastmilk feeding? No.  -Social determinants of health (SDOH) reviewed in EPIC. No concerns  3. Sexuality, contraception and birth spacing - Patient does not want a pregnancy in the next year.  Desired  family size is 2 children.  - Reviewed forms of contraception in tiered fashion. Patient desired oral progesterone-only contraceptive today.   - Discussed birth spacing of 18 months  4. Sleep and fatigue -Encouraged family/partner/community support of 4 hrs of uninterrupted sleep to help with mood and fatigue  5. Physical Recovery  - Discussed patients delivery and complications. She describes her labor as good. - Patient had a Vaginal, no problems at delivery. Patient had no lacerations.  - Patient has urinary incontinence? No. - Patient is safe to resume physical and sexual activity  6.  Health Maintenance - HM due items addressed Yes - Last pap smear No results found for: DIAGPAP Pap smear not done at today's visit.  -Breast Cancer screening indicated? No.   7. Chronic Disease/Pregnancy Condition follow up: None    Coral Ceo, MD Center for [facility], [facility]  11/17/20

## 2020-11-18 LAB — CERVICOVAGINAL ANCILLARY ONLY
Bacterial Vaginitis (gardnerella): POSITIVE — AB
Candida Glabrata: NEGATIVE
Candida Vaginitis: NEGATIVE
Chlamydia: NEGATIVE
Comment: NEGATIVE
Comment: NEGATIVE
Comment: NEGATIVE
Comment: NEGATIVE
Comment: NEGATIVE
Comment: NORMAL
Neisseria Gonorrhea: NEGATIVE
Trichomonas: NEGATIVE

## 2020-11-19 ENCOUNTER — Other Ambulatory Visit: Payer: Self-pay | Admitting: Obstetrics

## 2020-11-20 ENCOUNTER — Telehealth: Payer: Self-pay

## 2020-11-20 NOTE — Telephone Encounter (Signed)
-----   Message from Brock Bad, MD sent at 11/19/2020  8:24 AM EDT ----- Flagyl Rx for BV

## 2020-11-20 NOTE — Telephone Encounter (Signed)
Test results online

## 2020-12-29 ENCOUNTER — Encounter: Payer: Self-pay | Admitting: Obstetrics

## 2020-12-31 ENCOUNTER — Encounter: Payer: Self-pay | Admitting: Obstetrics

## 2021-01-27 ENCOUNTER — Emergency Department (HOSPITAL_BASED_OUTPATIENT_CLINIC_OR_DEPARTMENT_OTHER)
Admission: EM | Admit: 2021-01-27 | Discharge: 2021-01-27 | Disposition: A | Discharge status: Home / Self Care | Payer: Medicaid Other | Attending: Emergency Medicine | Admitting: Emergency Medicine

## 2021-01-27 ENCOUNTER — Emergency Department (HOSPITAL_BASED_OUTPATIENT_CLINIC_OR_DEPARTMENT_OTHER): Payer: Medicaid Other

## 2021-01-27 ENCOUNTER — Other Ambulatory Visit: Payer: Self-pay

## 2021-01-27 ENCOUNTER — Encounter (HOSPITAL_BASED_OUTPATIENT_CLINIC_OR_DEPARTMENT_OTHER): Payer: Self-pay | Admitting: *Deleted

## 2021-01-27 DIAGNOSIS — Z8616 Personal history of COVID-19: Secondary | ICD-10-CM | POA: Diagnosis not present

## 2021-01-27 DIAGNOSIS — M545 Low back pain, unspecified: Secondary | ICD-10-CM | POA: Diagnosis not present

## 2021-01-27 DIAGNOSIS — R102 Pelvic and perineal pain: Secondary | ICD-10-CM | POA: Diagnosis not present

## 2021-01-27 DIAGNOSIS — R1030 Lower abdominal pain, unspecified: Secondary | ICD-10-CM | POA: Diagnosis not present

## 2021-01-27 DIAGNOSIS — M7918 Myalgia, other site: Secondary | ICD-10-CM | POA: Insufficient documentation

## 2021-01-27 LAB — URINALYSIS, ROUTINE W REFLEX MICROSCOPIC
Bilirubin Urine: NEGATIVE
Glucose, UA: NEGATIVE mg/dL
Hgb urine dipstick: NEGATIVE
Ketones, ur: NEGATIVE mg/dL
Leukocytes,Ua: NEGATIVE
Nitrite: NEGATIVE
Protein, ur: NEGATIVE mg/dL
Specific Gravity, Urine: 1.03 (ref 1.005–1.030)
pH: 6 (ref 5.0–8.0)

## 2021-01-27 LAB — WET PREP, GENITAL
Clue Cells Wet Prep HPF POC: NONE SEEN
Trich, Wet Prep: NONE SEEN
Yeast Wet Prep HPF POC: NONE SEEN

## 2021-01-27 LAB — PREGNANCY, URINE: Preg Test, Ur: NEGATIVE

## 2021-01-27 NOTE — Discharge Instructions (Addendum)
Your workup was overall reassuring in the ED today. We have tested you for gonorrhea and chlamydia and will call you in 2-3 days time IF you test positive. You can also log into MyChart and check the results that way.   Please refrain from intercourse until you hear about your results  Follow up with Center for [facility] for further evaluation of your pelvic pain  I would recommend Ibuprofen and Tylenol as needed for pain  Return to the ED for any new/worsening symptoms

## 2021-01-27 NOTE — ED Provider Notes (Signed)
[facility] EMERGENCY DEPARTMENT Provider Note   CSN: [id number] Arrival date & time: 01/27/21  1923     History Chief Complaint  Patient presents with   Pelvic Pain    Andrea Carson is a 25 y.o. female who presents to the ED Today with complaint of gradual onset, constant, achy, lower abdominal/pelvic pain and lower back pain for the past week. Pt did not think much of it until she attempted to have intercourse today and began having pain with intercourse prompting ED visit today. Pt denies vaginal discharge. Reports her last normal menstrual period was on 08/07. She also mentions she feels like she is urinating more than normal as well. She denies fevers, chills, nausea, vomiting, diarrhea.   The history is provided by the patient and medical records.      Past Medical History:  Diagnosis Date   BV (bacterial vaginosis)    Constipation    Eczema    Eczema    Environmental allergies    Gall stones    Heart murmur    pt reports as a child-but has resolved   Infection    UTI   Seasonal allergies    Vaginal delivery 05/31/2014    Patient Active Problem List   Diagnosis Date Noted   Mild preeclampsia 09/15/2020   Hx laparoscopic cholecystectomy 08/06/2020   History of 2019 novel coronavirus disease (COVID-19) 08/06/2020    Past Surgical History:  Procedure Laterality Date   ADENOIDECTOMY     CHOLECYSTECTOMY N/A 07/31/2014   Procedure: LAPAROSCOPIC CHOLECYSTECTOMY WITH INTRAOPERATIVE CHOLANGIOGRAM;  Surgeon: Avel Peace, MD;  Location: [facility] ORS;  Service: General;  Laterality: N/A;   THERAPEUTIC ABORTION     x3   TONSILLECTOMY       OB History     Gravida  7   Para  2   Term  2   Preterm  0   AB  5   Living  2      SAB  0   IAB  5   Ectopic  0   Multiple  0   Live Births  2           Family History  Problem Relation Age of Onset   Hypertension Maternal Grandmother    Cancer Maternal Grandmother    Hypertension Mother     Cancer Maternal Aunt    Anxiety disorder Maternal Uncle    Diabetes Paternal Grandmother     Social History   Tobacco Use   Smoking status: Never   Smokeless tobacco: Never  Vaping Use   Vaping Use: Former  Substance Use Topics   Alcohol use: Not Currently   Drug use: Not Currently    Types: Marijuana    Comment: daily     Home Medications Prior to Admission medications   Medication Sig Start Date End Date Taking? Authorizing Provider  acetaminophen (TYLENOL) 325 MG tablet Take 2 tablets (650 mg total) by mouth every 4 (four) hours as needed (for pain scale < 4). Patient not taking: Reported on 11/17/2020 09/16/20   Gita Kudo, MD  docusate sodium (COLACE) 100 MG capsule Take 1 capsule (100 mg total) by mouth 2 (two) times daily as needed. Patient not taking: No sig reported 08/20/20   Gerrit Heck, CNM  ibuprofen (ADVIL) 600 MG tablet Take 1 tablet (600 mg total) by mouth every 6 (six) hours. Patient not taking: Reported on 11/17/2020 09/16/20   Gita Kudo, MD  LO LOESTRIN FE 1  MG-10 MCG / 10 MCG tablet Take 1 tablet by mouth daily. 11/17/20   Brock Bad, MD  metroNIDAZOLE (FLAGYL) 500 MG tablet Take 1 tablet (500 mg total) by mouth 2 (two) times daily. 11/17/20   Brock Bad, MD  NIFEdipine (ADALAT CC) 30 MG 24 hr tablet Take 1 tablet (30 mg total) by mouth daily. 09/16/20 10/16/20  Gita Kudo, MD  Prenat-Fe Poly-Methfol-FA-DHA (VITAFOL ULTRA) 29-0.6-0.4-200 MG CAPS Take 1 capsule by mouth daily before breakfast. 11/17/20   Brock Bad, MD  promethazine (PHENERGAN) 12.5 MG tablet Take 1 tablet (12.5 mg total) by mouth at bedtime. 04/13/19 12/23/19  Marylene Land, CNM    Allergies    Bee venom, Fire ant, and Amoxicillin  Review of Systems   Review of Systems  Constitutional:  Negative for chills and fever.  Gastrointestinal:  Positive for abdominal pain (suprapubic). Negative for diarrhea, nausea and vomiting.  Genitourinary:  Positive  for frequency and pelvic pain. Negative for dysuria and flank pain.  Musculoskeletal:  Positive for back pain.  All other systems reviewed and are negative.  Physical Exam Updated Vital Signs BP 108/68   Pulse 88   Temp 98.5 F (36.9 C) (Oral)   Resp 16   Ht 5' (1.524 m)   Wt 63.5 kg   LMP 01/09/2021   SpO2 100%   Breastfeeding No   BMI 27.34 kg/m   Physical Exam Vitals and nursing note reviewed.  Constitutional:      Appearance: She is not ill-appearing or diaphoretic.  HENT:     Head: Normocephalic and atraumatic.  Eyes:     Conjunctiva/sclera: Conjunctivae normal.  Cardiovascular:     Rate and Rhythm: Normal rate and regular rhythm.     Pulses: Normal pulses.  Pulmonary:     Effort: Pulmonary effort is normal.     Breath sounds: Normal breath sounds. No wheezing, rhonchi or rales.  Abdominal:     Palpations: Abdomen is soft.     Tenderness: There is abdominal tenderness. There is no right CVA tenderness, left CVA tenderness, guarding or rebound.     Comments: Soft, suprapubic TTP, +BS throughout, no r/g/r, neg murphy's, neg mcburney's, no CVA TTP  Genitourinary:    Comments: Chaperone present for exam. No rashes, lesions, or tenderness to external genitalia. No erythema, injury, or tenderness to vaginal mucosa. No vaginal discharge or bleeding within vaginal vault. Mild TTP to left adnexa. No CMT, cervical friability, or discharge from cervical os. Cervical os is closed. Uterus non-deviated, mobile, nonTTP, and without enlargement.  Musculoskeletal:     Cervical back: Neck supple.  Skin:    General: Skin is warm and dry.  Neurological:     Mental Status: She is alert.    ED Results / Procedures / Treatments   Labs (all labs ordered are listed, but only abnormal results are displayed) Labs Reviewed  WET PREP, GENITAL - Abnormal; Notable for the following components:      Result Value   WBC, Wet Prep HPF POC MODERATE (*)    All other components within normal  limits  PREGNANCY, URINE  URINALYSIS, ROUTINE W REFLEX MICROSCOPIC  GC/CHLAMYDIA PROBE AMP ([place]) NOT AT [facility]    EKG None  Radiology US PELVIC COMPLETE W TRANSVAGINAL AND TORSION R/O  Result Date: 01/27/2021 CLINICAL DATA:  Pelvic pain EXAM: TRANSABDOMINAL AND TRANSVAGINAL ULTRASOUND OF PELVIS DOPPLER ULTRASOUND OF OVARIES TECHNIQUE: Both transabdominal and transvaginal ultrasound examinations of the pelvis were performed. Transabdominal technique  was performed for global imaging of the pelvis including uterus, ovaries, adnexal regions, and pelvic cul-de-sac. It was necessary to proceed with endovaginal exam following the transabdominal exam to visualize the uterus, endometrium, ovaries and adnexa. Color and duplex Doppler ultrasound was utilized to evaluate blood flow to the ovaries. COMPARISON:  01/07/2020 FINDINGS: Uterus Measurements: 9.9 x 3.9 x 5.7 cm = volume: 115 mL. No fibroids or other mass visualized. Endometrium Thickness: 8 mm in thickness.  No focal abnormality visualized. Right ovary Measurements: 3.6 x 2.5 x 2.9 cm = volume: 13.6 mL. Normal appearance/no adnexal mass. Left ovary Measurements: 2.8 x 2.2 x 2.5 cm = volume: 8.2 mL. Normal appearance/no adnexal mass. Pulsed Doppler evaluation of both ovaries demonstrates normal low-resistance arterial and venous waveforms. Other findings No abnormal free fluid. IMPRESSION: Normal study. Electronically Signed   By: Charlett Nose M.D.   On: 01/27/2021 20:36    Procedures Procedures   Medications Ordered in ED Medications - No data to display  ED Course  I have reviewed the triage vital signs and the nursing notes.  Pertinent labs & imaging results that were available during my care of the patient were reviewed by me and considered in my medical decision making (see chart for details).    MDM Rules/Calculators/A&P                           25 year old female presenting to the ED today with complaints of pelvic pain for  the past week, worse after intercourse today as well as urinary symptoms. On arrival to the ED VSS. Pt appears to be in NAD. She is noted to have suprapubic TTP on exam however states it is more in her pelvic region. Given complaint pelvic ultrasound ordered prior to ultrasound tech leaving for the day. Will plan for ultrasound and U/A and UPT at this time.   Pelvic ultrasound negative at this time UPT negative U/A without signs of infection Given unequivocal workup with pelvic pain will plan for pelvic exam at this time  Pelvic exam with mild left adnexal TTP however no findings appreciated on ultrasound. Wet prep negative. No discharge in vault to suggest gonorrhea or chlamydia however have advised pt await results prior to intercourse again. Workup overall reassuring at this time without acute findings. Pt instructed to take Ibuprofen and Tylenol PRN for pain. Stable for discharge.   This note was prepared using Dragon voice recognition software and may include unintentional dictation errors due to the inherent limitations of voice recognition software.   Final Clinical Impression(s) / ED Diagnoses Final diagnoses:  Pelvic pain in female    Rx / DC Orders ED Discharge Orders     None        Discharge Instructions      Your workup was overall reassuring in the ED today. We have tested you for gonorrhea and chlamydia and will call you in 2-3 days time IF you test positive. You can also log into MyChart and check the results that way.   Please refrain from intercourse until you hear about your results  Follow up with Center for [facility] for further evaluation of your pelvic pain  I would recommend Ibuprofen and Tylenol as needed for pain  Return to the ED for any new/worsening symptoms       Tanda Rockers, PA-C 01/27/21 2257    Gwyneth Sprout, MD 01/27/21 [phone]

## 2021-01-27 NOTE — ED Triage Notes (Addendum)
C/o pelvic pain and lower back pain x 1 week, denies vaginal discharge or urinary sx

## 2021-01-28 ENCOUNTER — Telehealth: Payer: Self-pay

## 2021-01-28 NOTE — Telephone Encounter (Signed)
Transition Care Management Follow-up Telephone Call Date of discharge and from where: 01/27/2021-[facility] How have you been since you were released from the hospital? Patient stated she is doing fine.  Any questions or concerns? No  Items Reviewed: Did the pt receive and understand the discharge instructions provided? Yes  Medications obtained and verified? Yes  Other? No  Any new allergies since your discharge? No  Dietary orders reviewed? N/A Do you have support at home? Yes   Home Care and Equipment/Supplies: Were home health services ordered? not applicable If so, what is the name of the agency? N/A  Has the agency set up a time to come to the patient's home? not applicable Were any new equipment or medical supplies ordered?  No What is the name of the medical supply agency? N/A Were you able to get the supplies/equipment? not applicable Do you have any questions related to the use of the equipment or supplies? No  Functional Questionnaire: (I = Independent and D = Dependent) ADLs: I  Bathing/Dressing- I  Meal Prep- I  Eating- I  Maintaining continence- I  Transferring/Ambulation- I  Managing Meds- I  Follow up appointments reviewed:  PCP Hospital f/u appt confirmed? No   Specialist Hospital f/u appt confirmed? No   Are transportation arrangements needed? No  If their condition worsens, is the pt aware to call PCP or go to the Emergency Dept.? Yes Was the patient provided with contact information for the PCP's office or ED? Yes Was to pt encouraged to call back with questions or concerns? Yes

## 2021-01-29 LAB — GC/CHLAMYDIA PROBE AMP (~~LOC~~) NOT AT ARMC
Chlamydia: NEGATIVE
Comment: NEGATIVE
Comment: NORMAL
Neisseria Gonorrhea: NEGATIVE

## 2021-03-31 ENCOUNTER — Inpatient Hospital Stay (HOSPITAL_COMMUNITY)
Admission: AD | Admit: 2021-03-31 | Discharge: 2021-03-31 | Disposition: A | Discharge status: Home / Self Care | Payer: Medicaid Other | Attending: Obstetrics and Gynecology | Admitting: Obstetrics and Gynecology

## 2021-03-31 ENCOUNTER — Other Ambulatory Visit: Payer: Self-pay

## 2021-03-31 ENCOUNTER — Inpatient Hospital Stay (HOSPITAL_COMMUNITY): Payer: Medicaid Other

## 2021-03-31 ENCOUNTER — Encounter (HOSPITAL_COMMUNITY): Payer: Self-pay | Admitting: Obstetrics and Gynecology

## 2021-03-31 DIAGNOSIS — Z880 Allergy status to penicillin: Secondary | ICD-10-CM | POA: Insufficient documentation

## 2021-03-31 DIAGNOSIS — O210 Mild hyperemesis gravidarum: Secondary | ICD-10-CM | POA: Diagnosis not present

## 2021-03-31 DIAGNOSIS — O26891 Other specified pregnancy related conditions, first trimester: Secondary | ICD-10-CM | POA: Diagnosis not present

## 2021-03-31 DIAGNOSIS — O219 Vomiting of pregnancy, unspecified: Secondary | ICD-10-CM

## 2021-03-31 DIAGNOSIS — O99321 Drug use complicating pregnancy, first trimester: Secondary | ICD-10-CM | POA: Diagnosis not present

## 2021-03-31 DIAGNOSIS — F1290 Cannabis use, unspecified, uncomplicated: Secondary | ICD-10-CM

## 2021-03-31 DIAGNOSIS — Z3A01 Less than 8 weeks gestation of pregnancy: Secondary | ICD-10-CM

## 2021-03-31 DIAGNOSIS — O3680X0 Pregnancy with inconclusive fetal viability, not applicable or unspecified: Secondary | ICD-10-CM | POA: Diagnosis not present

## 2021-03-31 DIAGNOSIS — R112 Nausea with vomiting, unspecified: Secondary | ICD-10-CM

## 2021-03-31 DIAGNOSIS — Z79899 Other long term (current) drug therapy: Secondary | ICD-10-CM | POA: Insufficient documentation

## 2021-03-31 DIAGNOSIS — Z87891 Personal history of nicotine dependence: Secondary | ICD-10-CM | POA: Diagnosis not present

## 2021-03-31 DIAGNOSIS — R109 Unspecified abdominal pain: Secondary | ICD-10-CM | POA: Diagnosis not present

## 2021-03-31 LAB — CBC WITH DIFFERENTIAL/PLATELET
Abs Immature Granulocytes: 0.02 10*3/uL (ref 0.00–0.07)
Basophils Absolute: 0 10*3/uL (ref 0.0–0.1)
Basophils Relative: 0 %
Eosinophils Absolute: 0 10*3/uL (ref 0.0–0.5)
Eosinophils Relative: 0 %
HCT: 35.8 % — ABNORMAL LOW (ref 36.0–46.0)
Hemoglobin: 12.6 g/dL (ref 12.0–15.0)
Immature Granulocytes: 0 %
Lymphocytes Relative: 23 %
Lymphs Abs: 1.7 10*3/uL (ref 0.7–4.0)
MCH: 32.6 pg (ref 26.0–34.0)
MCHC: 35.2 g/dL (ref 30.0–36.0)
MCV: 92.5 fL (ref 80.0–100.0)
Monocytes Absolute: 0.5 10*3/uL (ref 0.1–1.0)
Monocytes Relative: 7 %
Neutro Abs: 5.2 10*3/uL (ref 1.7–7.7)
Neutrophils Relative %: 70 %
Platelets: 213 10*3/uL (ref 150–400)
RBC: 3.87 MIL/uL (ref 3.87–5.11)
RDW: 12.8 % (ref 11.5–15.5)
WBC: 7.4 10*3/uL (ref 4.0–10.5)
nRBC: 0 % (ref 0.0–0.2)

## 2021-03-31 LAB — BASIC METABOLIC PANEL
Anion gap: 11 (ref 5–15)
BUN: 10 mg/dL (ref 6–20)
CO2: 20 mmol/L — ABNORMAL LOW (ref 22–32)
Calcium: 9.4 mg/dL (ref 8.9–10.3)
Chloride: 103 mmol/L (ref 98–111)
Creatinine, Ser: 0.67 mg/dL (ref 0.44–1.00)
GFR, Estimated: >60 mL/min (ref >60)
Glucose, Bld: 84 mg/dL (ref 70–99)
Potassium: 3.6 mmol/L (ref 3.5–5.1)
Sodium: 134 mmol/L — ABNORMAL LOW (ref 135–145)

## 2021-03-31 LAB — URINALYSIS, MICROSCOPIC (REFLEX)

## 2021-03-31 LAB — URINALYSIS, ROUTINE W REFLEX MICROSCOPIC
Glucose, UA: NEGATIVE mg/dL
Hgb urine dipstick: NEGATIVE
Ketones, ur: >80 mg/dL — AB
Leukocytes,Ua: NEGATIVE
Nitrite: NEGATIVE
Protein, ur: 30 mg/dL — AB
Specific Gravity, Urine: >1.03 — ABNORMAL HIGH (ref 1.005–1.030)
pH: 6.5 (ref 5.0–8.0)

## 2021-03-31 LAB — POCT PREGNANCY, URINE: Preg Test, Ur: POSITIVE — AB

## 2021-03-31 LAB — HCG, QUANTITATIVE, PREGNANCY: hCG, Beta Chain, Quant, S: 34943 m[IU]/mL — ABNORMAL HIGH (ref <5)

## 2021-03-31 MED ORDER — DOXYLAMINE-PYRIDOXINE 10-10 MG PO TBEC
2.0000 | DELAYED_RELEASE_TABLET | Freq: Every day | ORAL | 2 refills | Status: DC
Start: 2021-03-31 — End: 2023-01-01

## 2021-03-31 MED ORDER — SODIUM CHLORIDE 0.9 % IV SOLN
25.0000 mg | Freq: Once | INTRAVENOUS | Status: AC
  Administered 2021-03-31: 25 mg via INTRAVENOUS
  Filled 2021-03-31: qty 1

## 2021-03-31 MED ORDER — METOCLOPRAMIDE HCL 10 MG PO TABS: 10.0000 mg | ORAL_TABLET | Freq: Four times a day (QID) | ORAL | 1 refills | Status: DC

## 2021-03-31 MED ORDER — HALOPERIDOL LACTATE 5 MG/ML IJ SOLN
2.0000 mg | Freq: Once | INTRAMUSCULAR | Status: AC
  Administered 2021-03-31: 2 mg via INTRAVENOUS
  Filled 2021-03-31 (×2): qty 0.4

## 2021-03-31 MED ORDER — LACTATED RINGERS IV BOLUS
1000.0000 mL | Freq: Once | INTRAVENOUS | Status: AC
  Administered 2021-03-31: 1000 mL via INTRAVENOUS

## 2021-03-31 NOTE — MAU Note (Signed)
Presents with c/o N/V, reports unable to keep anything down for past 2 days.   LMP 02/14/2021.

## 2021-03-31 NOTE — MAU Provider Note (Signed)
History     CSN: [id number]  Arrival date and time: 03/31/21 6606   Event Date/Time   First Provider Initiated Contact with Patient 03/31/21 1129      Chief Complaint  Patient presents with   Emesis   Nausea   Andrea Carson is a 25 y.o. [id number] at [redacted]w[redacted]d by Unsure LMP of Sept 11, 2022.  She presents today for Emesis and Nausea.  She states her symtpoms started two days ago while trying to eat.  She reports she has been unable to keep anything down since that time.  She denies vaginal bleeding, discharge, or abdominal cramping/pain.  She endorses a history of MJ usage and reports last usage was 2 days ago.    OB History     Gravida  8   Para  2   Term  2   Preterm  0   AB  5   Living  2      SAB  0   IAB  5   Ectopic  0   Multiple  0   Live Births  2           Past Medical History:  Diagnosis Date   BV (bacterial vaginosis)    Constipation    Eczema    Eczema    Environmental allergies    Gall stones    Heart murmur    pt reports as a child-but has resolved   Infection    UTI   Seasonal allergies    Vaginal delivery 05/31/2014    Past Surgical History:  Procedure Laterality Date   ADENOIDECTOMY     CHOLECYSTECTOMY N/A 07/31/2014   Procedure: LAPAROSCOPIC CHOLECYSTECTOMY WITH INTRAOPERATIVE CHOLANGIOGRAM;  Surgeon: Avel Peace, MD;  Location: [facility];  Service: General;  Laterality: N/A;   THERAPEUTIC ABORTION     x3   TONSILLECTOMY      Family History  Problem Relation Age of Onset   Hypertension Maternal Grandmother    Cancer Maternal Grandmother    Hypertension Mother    Cancer Maternal Aunt    Anxiety disorder Maternal Uncle    Diabetes Paternal Grandmother     Social History   Tobacco Use   Smoking status: Never   Smokeless tobacco: Never  Vaping Use   Vaping Use: Former  Substance Use Topics   Alcohol use: Not Currently   Drug use: Not Currently    Types: Marijuana    Comment: 2 days ago    Allergies:  Allergies   Allergen Reactions   Bee Venom Anaphylaxis and Other (See Comments)    Pt has an Theme park manager Anaphylaxis   Amoxicillin Rash    CHILDHOOD ALLERGY Has patient had a PCN reaction causing immediate rash, facial/tongue/throat swelling, SOB or lightheadedness with hypotension: Yes Has patient had a PCN reaction causing severe rash involving mucus membranes or skin necrosis: No Has patient had a PCN reaction that required hospitalization No Has patient had a PCN reaction occurring within the last 10 years: No  If all of the above answers are "NO", then may proceed with Cephalosporin use.     Medications Prior to Admission  Medication Sig Dispense Refill Last Dose   acetaminophen (TYLENOL) 325 MG tablet Take 2 tablets (650 mg total) by mouth every 4 (four) hours as needed (for pain scale < 4). (Patient not taking: Reported on 11/17/2020) 30 tablet 0    docusate sodium (COLACE) 100 MG capsule Take 1 capsule (100 mg total) by  mouth 2 (two) times daily as needed. (Patient not taking: No sig reported) 30 capsule 2    ibuprofen (ADVIL) 600 MG tablet Take 1 tablet (600 mg total) by mouth every 6 (six) hours. (Patient not taking: Reported on 11/17/2020) 30 tablet 0    LO LOESTRIN FE 1 MG-10 MCG / 10 MCG tablet Take 1 tablet by mouth daily. 28 tablet 11    metroNIDAZOLE (FLAGYL) 500 MG tablet Take 1 tablet (500 mg total) by mouth 2 (two) times daily. 14 tablet 2    NIFEdipine (ADALAT CC) 30 MG 24 hr tablet Take 1 tablet (30 mg total) by mouth daily. 30 tablet 0    Prenat-Fe Poly-Methfol-FA-DHA (VITAFOL ULTRA) 29-0.6-0.4-200 MG CAPS Take 1 capsule by mouth daily before breakfast. 90 capsule 4     Review of Systems  Gastrointestinal:  Positive for nausea and vomiting. Negative for abdominal pain.  Genitourinary:  Negative for difficulty urinating, dysuria, vaginal bleeding and vaginal discharge.  Physical Exam   Blood pressure 112/70, pulse 80, temperature 98.2 F (36.8 C), temperature source  Oral, resp. rate 18, height 5\' 1"  (1.549 m), weight 58.6 kg, last menstrual period 02/14/2021, SpO2 100 %, not currently breastfeeding.  Physical Exam Vitals reviewed.  Constitutional:      Appearance: Normal appearance.  HENT:     Head: Normocephalic and atraumatic.  Eyes:     Conjunctiva/sclera: Conjunctivae normal.  Cardiovascular:     Rate and Rhythm: Normal rate.  Pulmonary:     Effort: Pulmonary effort is normal. No respiratory distress.  Musculoskeletal:        General: Normal range of motion.     Cervical back: Normal range of motion.  Neurological:     Mental Status: She is alert and oriented to person, place, and time.  Psychiatric:        Mood and Affect: Mood normal.        Behavior: Behavior normal.        Thought Content: Thought content normal.    MAU Course  Procedures Results for orders placed or performed during the hospital encounter of 03/31/21 (from the past 24 hour(s))  Pregnancy, urine POC     Status: Abnormal   Collection Time: 03/31/21 10:14 AM  Result Value Ref Range   Preg Test, Ur POSITIVE (A) NEGATIVE  Urinalysis, Routine w reflex microscopic Urine, Clean Catch     Status: Abnormal   Collection Time: 03/31/21 10:18 AM  Result Value Ref Range   Color, Urine AMBER (A) YELLOW   APPearance CLOUDY (A) CLEAR   Specific Gravity, Urine >1.030 (H) 1.005 - 1.030   pH 6.5 5.0 - 8.0   Glucose, UA NEGATIVE NEGATIVE mg/dL   Hgb urine dipstick NEGATIVE NEGATIVE   Bilirubin Urine MODERATE (A) NEGATIVE   Ketones, ur >80 (A) NEGATIVE mg/dL   Protein, ur 30 (A) NEGATIVE mg/dL   Nitrite NEGATIVE NEGATIVE   Leukocytes,Ua NEGATIVE NEGATIVE  Urinalysis, Microscopic (reflex)     Status: Abnormal   Collection Time: 03/31/21 10:18 AM  Result Value Ref Range   RBC / HPF 0-5 0 - 5 RBC/hpf   WBC, UA 6-10 0 - 5 WBC/hpf   Bacteria, UA RARE (A) NONE SEEN   Squamous Epithelial / LPF 6-10 0 - 5   Mucus PRESENT   CBC with Differential/Platelet     Status: Abnormal    Collection Time: 03/31/21 11:48 AM  Result Value Ref Range   WBC 7.4 4.0 - 10.5 K/uL   RBC 3.87  3.87 - 5.11 MIL/uL   Hemoglobin 12.6 12.0 - 15.0 g/dL   HCT 37.1 (L) 06.2 - 69.4 %   MCV 92.5 80.0 - 100.0 fL   MCH 32.6 26.0 - 34.0 pg   MCHC 35.2 30.0 - 36.0 g/dL   RDW 85.4 62.7 - 03.5 %   Platelets 213 150 - 400 K/uL   nRBC 0.0 0.0 - 0.2 %   Neutrophils Relative % 70 %   Neutro Abs 5.2 1.7 - 7.7 K/uL   Lymphocytes Relative 23 %   Lymphs Abs 1.7 0.7 - 4.0 K/uL   Monocytes Relative 7 %   Monocytes Absolute 0.5 0.1 - 1.0 K/uL   Eosinophils Relative 0 %   Eosinophils Absolute 0.0 0.0 - 0.5 K/uL   Basophils Relative 0 %   Basophils Absolute 0.0 0.0 - 0.1 K/uL   Immature Granulocytes 0 %   Abs Immature Granulocytes 0.02 0.00 - 0.07 K/uL  hCG, quantitative, pregnancy     Status: Abnormal   Collection Time: 03/31/21 11:48 AM  Result Value Ref Range   hCG, Beta Chain, Quant, S 34,943 (H) <5 mIU/mL  Basic metabolic panel     Status: Abnormal   Collection Time: 03/31/21 11:49 AM  Result Value Ref Range   Sodium 134 (L) 135 - 145 mmol/L   Potassium 3.6 3.5 - 5.1 mmol/L   Chloride 103 98 - 111 mmol/L   CO2 20 (L) 22 - 32 mmol/L   Glucose, Bld 84 70 - 99 mg/dL   BUN 10 6 - 20 mg/dL   Creatinine, Ser 0.09 0.44 - 1.00 mg/dL   Calcium 9.4 8.9 - 38.1 mg/dL   GFR, Estimated >82 >99 mL/min   Anion gap 11 5 - 15   US OB LESS THAN 14 WEEKS WITH OB TRANSVAGINAL  Result Date: 03/31/2021 CLINICAL DATA:  Abdominal pain.  Early pregnancy. EXAM: OBSTETRIC <14 WK [place] AND TRANSVAGINAL OB US TECHNIQUE: Both transabdominal and transvaginal ultrasound examinations were performed for complete evaluation of the gestation as well as the maternal uterus, adnexal regions, and pelvic cul-de-sac. Transvaginal technique was performed to assess early pregnancy. COMPARISON:  None. FINDINGS: Intrauterine gestational sac: Present, single and normal. Yolk sac:  Present Embryo:  Present Cardiac Activity: Present Heart  Rate: 109 bpm CRL:  2.3 mm   5 w   5 d                  [place] EDC: 11/26/2021 Subchorionic hemorrhage:  None visualized. Maternal uterus/adnexae: Uterus and ovaries appear normal. No free fluid. IMPRESSION: Normal appearing living single intrauterine pregnancy at 5 weeks 5 days by crown-rump length. Electronically Signed   By: Paulina Fusi M.D.   On: 03/31/2021 14:54    MDM Start IV LR Bolus  Antiemetic Antipsychotic Labs: CBC/D, BMP, hCG Ultrasound Assessment and Plan  25 year old [id number] at 6.3 weeks Likely Cannabinoid Hyperemesis Unsure LMP  -POC Reviewed. -Labs ordered -Start IV f/b LR Bolus with phenergan. -Discussed usage of Haldol for suspected Cannabinoid Hyperemesis -Patient agreeable and without questions. -Will give 2mg  IV -Will await results and possibly send for to assess viability.   [place] 03/31/2021, 11:29 AM   Reassessment (12:55 PM)  -Phenergan has arrived, from pharmacy, for infusion.  Reassessment (1:59 PM) -Phenergan infusion complete -Provider to bedside and patient reports improvement in symptoms. -Will give additional bolus of LR. -Results reviewed. -Will send for 04/02/2021 to assess viability. -Patient without questions or concerns. -Will await results and  reassess.   Reassessment (3:34 PM) SIUP at 5.5 weeks  -Patient reports resolution of nausea. -Discussed home usage of diclegis/bonjesta.  Instructed to take in Am after work since patient reports working night shift. -Also discussed OOW for tonight.  Note given -Instructed to start [facility]. -Rx for diclegis and reglan sent to pharmacy on file.  -Precautions Given. -Encouraged to call primary office or return to MAU if symptoms worsen or with the onset of new symptoms. -Discharged to home in improved condition.  Cherre Robins MSN, CNM Advanced Practice Provider, [facility] [employer]

## 2021-04-05 ENCOUNTER — Other Ambulatory Visit: Payer: Self-pay

## 2021-04-05 ENCOUNTER — Encounter: Payer: Self-pay | Admitting: Emergency Medicine

## 2021-04-05 ENCOUNTER — Ambulatory Visit
Admission: EM | Admit: 2021-04-05 | Discharge: 2021-04-05 | Disposition: A | Discharge status: Home / Self Care | Payer: Medicaid Other

## 2021-04-05 DIAGNOSIS — M545 Low back pain, unspecified: Secondary | ICD-10-CM | POA: Diagnosis not present

## 2021-04-05 DIAGNOSIS — R1030 Lower abdominal pain, unspecified: Secondary | ICD-10-CM | POA: Diagnosis not present

## 2021-04-05 DIAGNOSIS — O209 Hemorrhage in early pregnancy, unspecified: Secondary | ICD-10-CM | POA: Diagnosis not present

## 2021-04-05 NOTE — ED Provider Notes (Signed)
Andrea Carson    CSN: [id number] Arrival date & time: 04/05/21  1653      History   Chief Complaint Chief Complaint  Patient presents with   Abdominal Cramping   Miscarriage    HPI Andrea Carson is a 25 y.o. female.   Patient presents with vaginal bleeding, lower abdominal pain and cramping, and lower back pain that started a few days ago.  Patient reports that she thinks that she has had a miscarriage and is having complications.  Patient had a positive pregnancy test on 03/31/2021.  Patient reports that abdominal pain is radiating down into her pelvic and vaginal area.  Vaginal bleeding has slowed down and is now brown in color and is a small amount.  Denies any urinary burning, urinary frequency, vaginal discharge.  Denies any fevers.  Denies chest pain or shortness of breath.   Abdominal Cramping   Past Medical History:  Diagnosis Date   BV (bacterial vaginosis)    Constipation    Eczema    Eczema    Environmental allergies    Gall stones    Heart murmur    pt reports as a child-but has resolved   Infection    UTI   Seasonal allergies    Vaginal delivery 05/31/2014    Patient Active Problem List   Diagnosis Date Noted   Mild preeclampsia 09/15/2020   Hx laparoscopic cholecystectomy 08/06/2020   History of 2019 novel coronavirus disease (COVID-19) 08/06/2020    Past Surgical History:  Procedure Laterality Date   ADENOIDECTOMY     CHOLECYSTECTOMY N/A 07/31/2014   Procedure: LAPAROSCOPIC CHOLECYSTECTOMY WITH INTRAOPERATIVE CHOLANGIOGRAM;  Surgeon: [name], MD;  Location: [facility];  Service: General;  Laterality: N/A;   THERAPEUTIC ABORTION     x3   TONSILLECTOMY      OB History     Gravida  8   Para  2   Term  2   Preterm  0   AB  5   Living  2      SAB  0   IAB  5   Ectopic  0   Multiple  0   Live Births  2            Home Medications    Prior to Admission medications   Medication Sig Start Date End Date  Taking? Authorizing Provider  acetaminophen (TYLENOL) 325 MG tablet Take 2 tablets (650 mg total) by mouth every 4 (four) hours as needed (for pain scale < 4). Patient not taking: Reported on 11/17/2020 09/16/20   [name], MD  Doxylamine-Pyridoxine 10-10 MG TBEC Take 2 tablets by mouth at bedtime. 03/31/21   [name], CNM  metoCLOPramide (REGLAN) 10 MG tablet Take 1 tablet (10 mg total) by mouth every 6 (six) hours. 03/31/21   [name], CNM  Prenat-Fe Poly-Methfol-FA-DHA (VITAFOL ULTRA) 29-0.6-0.4-200 MG CAPS Take 1 capsule by mouth daily before breakfast. 11/17/20   [name], MD  promethazine (PHENERGAN) 12.5 MG tablet Take 1 tablet (12.5 mg total) by mouth at bedtime. 04/13/19 12/23/19  [name], CNM    Family History Family History  Problem Relation Age of Onset   Hypertension Maternal Grandmother    Cancer Maternal Grandmother    Hypertension Mother    Cancer Maternal Aunt    Anxiety disorder Maternal Uncle    Diabetes Paternal Grandmother     Social History Social History   Tobacco Use   Smoking status: Never  Smokeless tobacco: Never  Vaping Use   Vaping Use: Former  Substance Use Topics   Alcohol use: Not Currently   Drug use: Not Currently    Types: Marijuana    Comment: 2 days ago     Allergies   Bee venom, Fire ant, and Amoxicillin   Review of Systems Review of Systems Per HPI  Physical Exam Triage Vital Signs ED Triage Vitals [04/05/21 1703]  Enc Vitals Group     BP 122/74     Pulse Rate 87     Resp 16     Temp 98.5 F (36.9 C)     Temp Source Oral     SpO2 99 %     Weight      Height      Head Circumference      Peak Flow      Pain Score 7     Pain Loc      Pain Edu?      Excl. in GC?    No data found.  Updated Vital Signs BP 122/74 (BP Location: Right Arm)   Pulse 87   Temp 98.5 F (36.9 C) (Oral)   Resp 16   LMP 02/14/2021 Comment: Currently having a miscarriage  SpO2 99%   Breastfeeding  Unknown   Visual Acuity Right Eye Distance:   Left Eye Distance:   Bilateral Distance:    Right Eye Near:   Left Eye Near:    Bilateral Near:     Physical Exam Constitutional:      General: She is not in acute distress.    Appearance: Normal appearance. She is not toxic-appearing or diaphoretic.  HENT:     Head: Normocephalic and atraumatic.  Eyes:     Extraocular Movements: Extraocular movements intact.     Conjunctiva/sclera: Conjunctivae normal.  Cardiovascular:     Rate and Rhythm: Normal rate and regular rhythm.     Pulses: Normal pulses.     Heart sounds: Normal heart sounds.  Pulmonary:     Effort: Pulmonary effort is normal. No respiratory distress.     Breath sounds: Normal breath sounds. No wheezing.  Neurological:     General: No focal deficit present.     Mental Status: She is alert and oriented to person, place, and time. Mental status is at baseline.  Psychiatric:        Mood and Affect: Mood normal.        Behavior: Behavior normal.        Thought Content: Thought content normal.        Judgment: Judgment normal.     UC Treatments / Results  Labs (all labs ordered are listed, but only abnormal results are displayed) Labs Reviewed - No data to display  EKG   Radiology No results found.  Procedures Procedures (including critical Carson time)  Medications Ordered in UC Medications - No data to display  Initial Impression / Assessment and Plan / UC Course  I have reviewed the triage vital signs and the nursing notes.  Pertinent labs & imaging results that were available during my Carson of the patient were reviewed by me and considered in my medical decision making (see chart for details).     Patient was sent to the [facility] emergency department for further evaluation and management given possible miscarriage and complications associated with this.  Patient was agreeable with plan and left to go to the emergency department.  Vital signs  stable at discharge.  Agree with patient self transport to the hospital. Final Clinical Impressions(s) / UC Diagnoses   Final diagnoses:  Vaginal bleeding affecting early pregnancy  Lower abdominal pain  Acute bilateral low back pain without sciatica     Discharge Instructions      [facility] as soon as you leave urgent [facility] for further evaluation and management.     ED Prescriptions   None    PDMP not reviewed this encounter.   Teodora Medici, [place] 04/05/21 [phone]

## 2021-04-05 NOTE — ED Triage Notes (Signed)
Recent miscarriage within the last couple days. States she's not having a lot of vaginal discharge, but feels like it should be more with the way her stomach and back are hurting. Reports suprapubic pain and cramping radiating into her back. Denies shortness of breath.

## 2021-04-05 NOTE — Discharge Instructions (Signed)
[facility] soon as you leave urgent care for further evaluation and management.

## 2021-04-19 ENCOUNTER — Ambulatory Visit: Payer: Medicaid Other | Admitting: Obstetrics and Gynecology

## 2021-04-22 ENCOUNTER — Ambulatory Visit: Payer: Medicaid Other | Admitting: Obstetrics and Gynecology

## 2021-05-11 ENCOUNTER — Ambulatory Visit
Admission: EM | Admit: 2021-05-11 | Discharge: 2021-05-11 | Disposition: A | Discharge status: Home / Self Care | Payer: Medicaid Other | Attending: Physician Assistant | Admitting: Physician Assistant

## 2021-05-11 ENCOUNTER — Other Ambulatory Visit: Payer: Self-pay

## 2021-05-11 DIAGNOSIS — N898 Other specified noninflammatory disorders of vagina: Secondary | ICD-10-CM | POA: Insufficient documentation

## 2021-05-11 DIAGNOSIS — N390 Urinary tract infection, site not specified: Secondary | ICD-10-CM | POA: Diagnosis not present

## 2021-05-11 LAB — POCT URINALYSIS DIP (MANUAL ENTRY)
Bilirubin, UA: NEGATIVE
Glucose, UA: NEGATIVE mg/dL
Ketones, POC UA: NEGATIVE mg/dL
Nitrite, UA: NEGATIVE
Protein Ur, POC: NEGATIVE mg/dL
Spec Grav, UA: 1.025 (ref 1.010–1.025)
Urobilinogen, UA: 0.2 E.U./dL
pH, UA: 7 (ref 5.0–8.0)

## 2021-05-11 LAB — POCT URINE PREGNANCY: Preg Test, Ur: NEGATIVE

## 2021-05-11 MED ORDER — METRONIDAZOLE 500 MG PO TABS: 500.0000 mg | ORAL_TABLET | Freq: Two times a day (BID) | ORAL | 0 refills | Status: DC

## 2021-05-11 MED ORDER — NITROFURANTOIN MONOHYD MACRO 100 MG PO CAPS: 100.0000 mg | ORAL_CAPSULE | Freq: Two times a day (BID) | ORAL | 0 refills | Status: DC

## 2021-05-11 MED ORDER — FLUCONAZOLE 150 MG PO TABS
150.0000 mg | ORAL_TABLET | Freq: Once | ORAL | 0 refills | Status: AC
Start: 2021-05-11 — End: 2021-05-11

## 2021-05-11 NOTE — ED Triage Notes (Signed)
Pt c/o white vaginal discharge with a slight odor x4 days. C/o urinary frequency and lower abdominal discomfort.

## 2021-05-11 NOTE — ED Provider Notes (Signed)
[facility]    CSN: [id number] Arrival date & time: 05/11/21  1742      History   Chief Complaint Chief Complaint  Patient presents with   Vaginal Discharge    HPI Andrea Carson is a 25 y.o. female.   Patient here today for evaluation of urinary frequency and lower abdominal discomfort, vaginal discharge she has had for the last 4 days.  She reports that her discharge does have a slight odor to it and is white in color.  She has not had any fever.  She denies any significant dysuria.  She does not report treatment for symptoms.  She states she does get BV frequently and would like treatment for same.  The history is provided by the patient.  Vaginal Discharge Associated symptoms: abdominal pain   Associated symptoms: no dysuria, no fever, no nausea and no vomiting    Past Medical History:  Diagnosis Date   BV (bacterial vaginosis)    Constipation    Eczema    Eczema    Environmental allergies    Gall stones    Heart murmur    pt reports as a child-but has resolved   Infection    UTI   Seasonal allergies    Vaginal delivery 05/31/2014    Patient Active Problem List   Diagnosis Date Noted   Mild preeclampsia 09/15/2020   Hx laparoscopic cholecystectomy 08/06/2020   History of 2019 novel coronavirus disease (COVID-19) 08/06/2020    Past Surgical History:  Procedure Laterality Date   ADENOIDECTOMY     CHOLECYSTECTOMY N/A 07/31/2014   Procedure: LAPAROSCOPIC CHOLECYSTECTOMY WITH INTRAOPERATIVE CHOLANGIOGRAM;  Surgeon: [name], MD;  Location: [facility];  Service: General;  Laterality: N/A;   THERAPEUTIC ABORTION     x3   TONSILLECTOMY      OB History     Gravida  8   Para  2   Term  2   Preterm  0   AB  5   Living  2      SAB  0   IAB  5   Ectopic  0   Multiple  0   Live Births  2            Home Medications    Prior to Admission medications   Medication Sig Start Date End Date Taking? Authorizing Provider   fluconazole (DIFLUCAN) 150 MG tablet Take 1 tablet (150 mg total) by mouth once for 1 dose. 05/11/21 05/11/21 Yes Francene Finders, PA-C  metroNIDAZOLE (FLAGYL) 500 MG tablet Take 1 tablet (500 mg total) by mouth 2 (two) times daily. 05/11/21  Yes Francene Finders, PA-C  nitrofurantoin, macrocrystal-monohydrate, (MACROBID) 100 MG capsule Take 1 capsule (100 mg total) by mouth 2 (two) times daily. 05/11/21  Yes Francene Finders, PA-C  acetaminophen (TYLENOL) 325 MG tablet Take 2 tablets (650 mg total) by mouth every 4 (four) hours as needed (for pain scale < 4). Patient not taking: Reported on 11/17/2020 09/16/20   [name], MD  Doxylamine-Pyridoxine 10-10 MG TBEC Take 2 tablets by mouth at bedtime. 03/31/21   [name], CNM  metoCLOPramide (REGLAN) 10 MG tablet Take 1 tablet (10 mg total) by mouth every 6 (six) hours. 03/31/21   [name], CNM  promethazine (PHENERGAN) 12.5 MG tablet Take 1 tablet (12.5 mg total) by mouth at bedtime. 04/13/19 12/23/19  [name], CNM    Family History Family History  Problem Relation Age of Onset  Hypertension Maternal Grandmother    Cancer Maternal Grandmother    Hypertension Mother    Cancer Maternal Aunt    Anxiety disorder Maternal Uncle    Diabetes Paternal Grandmother     Social History Social History   Tobacco Use   Smoking status: Never   Smokeless tobacco: Never  Vaping Use   Vaping Use: Former  Substance Use Topics   Alcohol use: Not Currently   Drug use: Not Currently    Types: Marijuana    Comment: 2 days ago     Allergies   Bee venom, Fire ant, and Amoxicillin   Review of Systems Review of Systems  Constitutional:  Negative for chills and fever.  Eyes:  Negative for discharge and redness.  Respiratory:  Negative for shortness of breath.   Cardiovascular:  Negative for chest pain.  Gastrointestinal:  Positive for abdominal pain. Negative for nausea and vomiting.  Genitourinary:  Positive for  frequency, vaginal bleeding and vaginal discharge. Negative for dysuria.  Musculoskeletal:  Negative for back pain.    Physical Exam Triage Vital Signs ED Triage Vitals  Enc Vitals Group     BP 05/11/21 1912 105/67     Pulse Rate 05/11/21 1912 70     Resp 05/11/21 1912 20     Temp 05/11/21 1912 99.6 F (37.6 C)     Temp Source 05/11/21 1912 Oral     SpO2 05/11/21 1912 96 %     Weight --      Height --      Head Circumference --      Peak Flow --      Pain Score 05/11/21 1914 4     Pain Loc --      Pain Edu? --      Excl. in GC? --    No data found.  Updated Vital Signs BP 105/67 (BP Location: Left Arm)   Pulse 70   Temp 99.6 F (37.6 C) (Oral)   Resp 20   LMP 02/14/2021 Comment: miscarriage 04/03/2021  SpO2 96%   Breastfeeding No   Physical Exam Vitals and nursing note reviewed.  Constitutional:      General: She is not in acute distress.    Appearance: Normal appearance. She is not ill-appearing.  HENT:     Head: Normocephalic and atraumatic.  Eyes:     Conjunctiva/sclera: Conjunctivae normal.  Cardiovascular:     Rate and Rhythm: Normal rate.  Pulmonary:     Effort: Pulmonary effort is normal.  Neurological:     Mental Status: She is alert.  Psychiatric:        Mood and Affect: Mood normal.        Behavior: Behavior normal.        Thought Content: Thought content normal.     UC Treatments / Results  Labs (all labs ordered are listed, but only abnormal results are displayed) Labs Reviewed  POCT URINALYSIS DIP (MANUAL ENTRY) - Abnormal; Notable for the following components:      Result Value   Clarity, UA cloudy (*)    Blood, UA trace-intact (*)    Leukocytes, UA Small (1+) (*)    All other components within normal limits  URINE CULTURE  POCT URINE PREGNANCY  CERVICOVAGINAL ANCILLARY ONLY    EKG   Radiology No results found.  Procedures Procedures (including critical care time)  Medications Ordered in UC Medications - No data to  display  Initial Impression / Assessment and Plan / UC Course  I have reviewed the triage vital signs and the nursing notes.  Pertinent labs & imaging results that were available during my care of the patient were reviewed by me and considered in my medical decision making (see chart for details).    UA with trace leukocyte esterace, given symptoms we will treat to cover UTI and urine culture ordered.  Metronidazole also prescribed for BV coverage as requested.  Patient also requests treatment for yeast infection as she typically will have this occur after antibiotic use.  Recommend follow-up if symptoms fail to improve or worsen anyway.  Final Clinical Impressions(s) / UC Diagnoses   Final diagnoses:  Urinary tract infection without hematuria, site unspecified  Vaginal discharge   Discharge Instructions   None    ED Prescriptions     Medication Sig Dispense Auth. Provider   metroNIDAZOLE (FLAGYL) 500 MG tablet Take 1 tablet (500 mg total) by mouth 2 (two) times daily. 14 tablet Ewell Poe F, PA-C   nitrofurantoin, macrocrystal-monohydrate, (MACROBID) 100 MG capsule Take 1 capsule (100 mg total) by mouth 2 (two) times daily. 10 capsule Francene Finders, PA-C   fluconazole (DIFLUCAN) 150 MG tablet Take 1 tablet (150 mg total) by mouth once for 1 dose. 1 tablet Francene Finders, PA-C      PDMP not reviewed this encounter.   Francene Finders, PA-C 05/11/21 1939

## 2021-05-13 LAB — CERVICOVAGINAL ANCILLARY ONLY
Bacterial Vaginitis (gardnerella): POSITIVE — AB
Candida Glabrata: NEGATIVE
Candida Vaginitis: NEGATIVE
Chlamydia: NEGATIVE
Comment: NEGATIVE
Comment: NEGATIVE
Comment: NEGATIVE
Comment: NEGATIVE
Comment: NEGATIVE
Comment: NORMAL
Neisseria Gonorrhea: NEGATIVE
Trichomonas: POSITIVE — AB

## 2021-05-13 LAB — URINE CULTURE: Culture: <10000 — AB

## 2021-06-22 ENCOUNTER — Other Ambulatory Visit: Payer: Self-pay

## 2021-06-22 ENCOUNTER — Encounter: Payer: Self-pay | Admitting: Emergency Medicine

## 2021-06-22 ENCOUNTER — Ambulatory Visit
Admission: EM | Admit: 2021-06-22 | Discharge: 2021-06-22 | Disposition: A | Discharge status: Home / Self Care | Payer: Medicaid Other | Attending: Physician Assistant | Admitting: Physician Assistant

## 2021-06-22 DIAGNOSIS — Z3201 Encounter for pregnancy test, result positive: Secondary | ICD-10-CM

## 2021-06-22 DIAGNOSIS — Z113 Encounter for screening for infections with a predominantly sexual mode of transmission: Secondary | ICD-10-CM | POA: Insufficient documentation

## 2021-06-22 DIAGNOSIS — Z202 Contact with and (suspected) exposure to infections with a predominantly sexual mode of transmission: Secondary | ICD-10-CM | POA: Diagnosis not present

## 2021-06-22 LAB — POCT URINALYSIS DIP (MANUAL ENTRY)
Bilirubin, UA: NEGATIVE
Glucose, UA: NEGATIVE mg/dL
Ketones, POC UA: NEGATIVE mg/dL
Leukocytes, UA: NEGATIVE
Nitrite, UA: NEGATIVE
Protein Ur, POC: 30 mg/dL — AB
Spec Grav, UA: >=1.03 — AB (ref 1.010–1.025)
Urobilinogen, UA: 0.2 E.U./dL
pH, UA: 5.5 (ref 5.0–8.0)

## 2021-06-22 LAB — POCT URINE PREGNANCY: Preg Test, Ur: POSITIVE — AB

## 2021-06-22 MED ORDER — METRONIDAZOLE 500 MG PO TABS: 500.0000 mg | ORAL_TABLET | Freq: Two times a day (BID) | ORAL | 0 refills | Status: DC

## 2021-06-22 NOTE — ED Triage Notes (Signed)
Patient c/o vaginal soreness, urinary frequency and urgency.  Patient was treated for trichomonas last month wants to be checked again.

## 2021-06-22 NOTE — ED Provider Notes (Signed)
[name]    CSN: [id number] Arrival date & time: 06/22/21  1814      History   Chief Complaint Chief Complaint  Patient presents with   Vaginitis    HPI Andrea Carson is a 26 y.o. female.   Patient here today for evaluation of vaginal soreness, urinary frequency and urgency. She reports that she did just have miscarriage about 10 days ago. She was also treated for trichomonas last month and is not sure if symptoms resolved from same.   The history is provided by the patient.   Past Medical History:  Diagnosis Date   BV (bacterial vaginosis)    Constipation    Eczema    Eczema    Environmental allergies    Gall stones    Heart murmur    pt reports as a child-but has resolved   Infection    UTI   Seasonal allergies    Vaginal delivery 05/31/2014    Patient Active Problem List   Diagnosis Date Noted   Mild preeclampsia 09/15/2020   Hx laparoscopic cholecystectomy 08/06/2020   History of 2019 novel coronavirus disease (COVID-19) 08/06/2020    Past Surgical History:  Procedure Laterality Date   ADENOIDECTOMY     CHOLECYSTECTOMY N/A 07/31/2014   Procedure: LAPAROSCOPIC CHOLECYSTECTOMY WITH INTRAOPERATIVE CHOLANGIOGRAM;  Surgeon: [name], MD;  Location: [facility];  Service: General;  Laterality: N/A;   THERAPEUTIC ABORTION     x3   TONSILLECTOMY      OB History     Gravida  8   Para  2   Term  2   Preterm  0   AB  5   Living  2      SAB  0   IAB  5   Ectopic  0   Multiple  0   Live Births  2            Home Medications    Prior to Admission medications   Medication Sig Start Date End Date Taking? Authorizing Provider  metroNIDAZOLE (FLAGYL) 500 MG tablet Take 1 tablet (500 mg total) by mouth 2 (two) times daily. 06/22/21  Yes Francene Finders, PA-C  acetaminophen (TYLENOL) 325 MG tablet Take 2 tablets (650 mg total) by mouth every 4 (four) hours as needed (for pain scale < 4). Patient not taking: Reported on  11/17/2020 09/16/20   [name], MD  Doxylamine-Pyridoxine 10-10 MG TBEC Take 2 tablets by mouth at bedtime. 03/31/21   [name], CNM  metoCLOPramide (REGLAN) 10 MG tablet Take 1 tablet (10 mg total) by mouth every 6 (six) hours. 03/31/21   [name], CNM  nitrofurantoin, macrocrystal-monohydrate, (MACROBID) 100 MG capsule Take 1 capsule (100 mg total) by mouth 2 (two) times daily. 05/11/21   Francene Finders, PA-C  promethazine (PHENERGAN) 12.5 MG tablet Take 1 tablet (12.5 mg total) by mouth at bedtime. 04/13/19 12/23/19  [name], CNM    Family History Family History  Problem Relation Age of Onset   Hypertension Maternal Grandmother    Cancer Maternal Grandmother    Hypertension Mother    Cancer Maternal Aunt    Anxiety disorder Maternal Uncle    Diabetes Paternal Grandmother     Social History Social History   Tobacco Use   Smoking status: Never   Smokeless tobacco: Never  Vaping Use   Vaping Use: Former  Substance Use Topics   Alcohol use: Not Currently   Drug use: Not Currently  Types: Marijuana    Comment: 2 days ago     Allergies   Bee venom, Fire ant, and Amoxicillin   Review of Systems Review of Systems  Constitutional:  Negative for chills and fever.  Eyes:  Negative for discharge and redness.  Respiratory:  Negative for shortness of breath.   Gastrointestinal:  Negative for abdominal pain, nausea and vomiting.  Genitourinary:  Positive for frequency and urgency. Negative for dysuria.    Physical Exam Triage Vital Signs ED Triage Vitals [06/22/21 1825]  Enc Vitals Group     BP 108/64     Pulse Rate 78     Resp      Temp 98.5 F (36.9 C)     Temp Source Oral     SpO2 98 %     Weight 129 lb 3 oz (58.6 kg)     Height 5\' 1"  (1.549 m)     Head Circumference      Peak Flow      Pain Score 7     Pain Loc      Pain Edu?      Excl. in [place]?    No data found.  Updated Vital Signs BP 108/64 (BP Location: Right Arm)     Pulse 78    Temp 98.5 F (36.9 C) (Oral)    Ht 5\' 1"  (1.549 m)    Wt 129 lb 3 oz (58.6 kg)    LMP 02/14/2021    SpO2 98%    Breastfeeding No    BMI 24.41 kg/m      Physical Exam Vitals and nursing note reviewed.  Constitutional:      General: She is not in acute distress.    Appearance: Normal appearance. She is not ill-appearing.  HENT:     Head: Normocephalic and atraumatic.  Eyes:     Conjunctiva/sclera: Conjunctivae normal.  Cardiovascular:     Rate and Rhythm: Normal rate.  Pulmonary:     Effort: Pulmonary effort is normal.  Neurological:     Mental Status: She is alert.  Psychiatric:        Mood and Affect: Mood normal.        Behavior: Behavior normal.        Thought Content: Thought content normal.     UC Treatments / Results  Labs (all labs ordered are listed, but only abnormal results are displayed) Labs Reviewed  POCT URINALYSIS DIP (MANUAL ENTRY) - Abnormal; Notable for the following components:      Result Value   Spec Grav, UA >=1.030 (*)    Blood, UA moderate (*)    Protein Ur, POC =30 (*)    All other components within normal limits  POCT URINE PREGNANCY - Abnormal; Notable for the following components:   Preg Test, Ur Positive (*)    All other components within normal limits  CERVICOVAGINAL ANCILLARY ONLY    EKG   Radiology No results found.  Procedures Procedures (including critical care time)  Medications Ordered in UC Medications - No data to display  Initial Impression / Assessment and Plan / UC Course  I have reviewed the triage vital signs and the nursing notes.  Pertinent labs & imaging results that were available during my care of the patient were reviewed by me and considered in my medical decision making (see chart for details).    Discussed that pregnancy test is still positive, likely from recent miscarriage but recommended follow up with OB in the event they  recommend additional labs. Will screen for STDs and reassured that UA  was without sign of UTI. Metronidazole refilled given patient's concern for trich. Will await results for further recommendation. Encouraged follow up with any further concerns.   Final Clinical Impressions(s) / UC Diagnoses   Final diagnoses:  Screening for STD (sexually transmitted disease)   Discharge Instructions   None    ED Prescriptions     Medication Sig Dispense Auth. Provider   metroNIDAZOLE (FLAGYL) 500 MG tablet Take 1 tablet (500 mg total) by mouth 2 (two) times daily. 14 tablet Francene Finders, PA-C      PDMP not reviewed this encounter.   Francene Finders, PA-C 06/22/21 1915

## 2021-06-24 LAB — CERVICOVAGINAL ANCILLARY ONLY
Bacterial Vaginitis (gardnerella): NEGATIVE
Candida Glabrata: NEGATIVE
Candida Vaginitis: NEGATIVE
Chlamydia: NEGATIVE
Comment: NEGATIVE
Comment: NEGATIVE
Comment: NEGATIVE
Comment: NEGATIVE
Comment: NEGATIVE
Comment: NORMAL
Neisseria Gonorrhea: NEGATIVE
Trichomonas: NEGATIVE

## 2021-06-30 ENCOUNTER — Encounter (HOSPITAL_BASED_OUTPATIENT_CLINIC_OR_DEPARTMENT_OTHER): Payer: Self-pay

## 2021-06-30 ENCOUNTER — Emergency Department (HOSPITAL_BASED_OUTPATIENT_CLINIC_OR_DEPARTMENT_OTHER)
Admission: EM | Admit: 2021-06-30 | Discharge: 2021-06-30 | Disposition: A | Discharge status: Home / Self Care | Payer: Medicaid Other | Attending: Emergency Medicine | Admitting: Emergency Medicine

## 2021-06-30 ENCOUNTER — Other Ambulatory Visit: Payer: Self-pay

## 2021-06-30 DIAGNOSIS — R350 Frequency of micturition: Secondary | ICD-10-CM | POA: Diagnosis not present

## 2021-06-30 DIAGNOSIS — Z79899 Other long term (current) drug therapy: Secondary | ICD-10-CM | POA: Diagnosis not present

## 2021-06-30 DIAGNOSIS — N898 Other specified noninflammatory disorders of vagina: Secondary | ICD-10-CM | POA: Diagnosis not present

## 2021-06-30 DIAGNOSIS — N9089 Other specified noninflammatory disorders of vulva and perineum: Secondary | ICD-10-CM | POA: Diagnosis not present

## 2021-06-30 LAB — URINALYSIS, ROUTINE W REFLEX MICROSCOPIC
Bilirubin Urine: NEGATIVE
Glucose, UA: NEGATIVE mg/dL
Ketones, ur: NEGATIVE mg/dL
Leukocytes,Ua: NEGATIVE
Nitrite: NEGATIVE
Protein, ur: NEGATIVE mg/dL
Specific Gravity, Urine: 1.02 (ref 1.005–1.030)
pH: 6 (ref 5.0–8.0)

## 2021-06-30 LAB — URINALYSIS, MICROSCOPIC (REFLEX)

## 2021-06-30 LAB — PREGNANCY, URINE: Preg Test, Ur: POSITIVE — AB

## 2021-06-30 MED ORDER — PHENAZOPYRIDINE HCL 200 MG PO TABS: 200.0000 mg | ORAL_TABLET | Freq: Three times a day (TID) | ORAL | 0 refills | Status: DC

## 2021-06-30 NOTE — ED Provider Notes (Signed)
[facility] EM[facility]ENC[facility] DEPARTMENT Provider Note   CSN: [id number] Arrival date & time: 06/30/21  1710     History  Chief Complaint  Patient presents with   Urinary Frequency    Andrea Carson is a 26 y.o. female.   Urinary Frequency   Patient presents with urinary frequency and lump inside of her vagina.  Symptoms started a few days ago, denies any dysuria or hematuria with the urinary frequency.  No abdominal pain, nausea, vomiting.  States about 2 days ago she noticed there was a lump on the right side of her vagina, no history of Bartholin cyst or abscess.  Denies any discharge, not painful and there has been no growth.  Not having fevers at home.  Patient reports she went through elective chemical abortion a few weeks ago, denies any vaginal bleeding.  Patient was recently treated for trichomoniasis in December, had repeat testing done a week ago which showed resolution of trichomoniasis and no new GC or chlamydia.  Home Medications Prior to Admission medications   Medication Sig Start Date End Date Taking? Authorizing Provider  acetaminophen (TYLENOL) 325 MG tablet Take 2 tablets (650 mg total) by mouth every 4 (four) hours as needed (for pain scale < 4). Patient not taking: Reported on 11/17/2020 09/16/20   [name], MD  Doxylamine-Pyridoxine 10-10 MG TBEC Take 2 tablets by mouth at bedtime. 03/31/21   [name], CNM  metoCLOPramide (REGLAN) 10 MG tablet Take 1 tablet (10 mg total) by mouth every 6 (six) hours. 03/31/21   [name], CNM  metroNIDAZOLE (FLAGYL) 500 MG tablet Take 1 tablet (500 mg total) by mouth 2 (two) times daily. 06/22/21   Francene Finders, PA-C  nitrofurantoin, macrocrystal-monohydrate, (MACROBID) 100 MG capsule Take 1 capsule (100 mg total) by mouth 2 (two) times daily. 05/11/21   Francene Finders, PA-C  promethazine (PHENERGAN) 12.5 MG tablet Take 1 tablet (12.5 mg total) by mouth at bedtime. 04/13/19 12/23/19  [name], CNM      Allergies    Bee venom, Fire ant, and Amoxicillin    Review of Systems   Review of Systems  Genitourinary:  Positive for frequency.   Physical Exam Updated Vital Signs BP 137/85 (BP Location: Left Arm)    Pulse 78    Temp 98.5 F (36.9 C) (Oral)    Resp 17    Ht 5' (1.524 m)    Wt 60.3 kg    LMP 06/27/2021    SpO2 100%    BMI 25.97 kg/m  Physical Exam Vitals and nursing note reviewed. Exam conducted with a chaperone present.  Constitutional:      Appearance: Normal appearance.  HENT:     Head: Normocephalic and atraumatic.  Eyes:     General: No scleral icterus.       Right eye: No discharge.        Left eye: No discharge.     Extraocular Movements: Extraocular movements intact.     Pupils: Pupils are equal, round, and reactive to light.  Cardiovascular:     Rate and Rhythm: Normal rate and regular rhythm.     Pulses: Normal pulses.     Heart sounds: Normal heart sounds. No murmur heard.   No friction rub. No gallop.  Pulmonary:     Effort: Pulmonary effort is normal. No respiratory distress.     Breath sounds: Normal breath sounds.  Abdominal:     General: Abdomen is flat. Bowel sounds are  normal. There is no distension.     Palpations: Abdomen is soft.     Tenderness: There is no abdominal tenderness.  Genitourinary:    Exam position: Lithotomy position.     Pubic Area: No rash.      Labia:        Right: Lesion present.      Urethra: No prolapse or urethral pain.  Skin:    General: Skin is warm and dry.     Coloration: Skin is not jaundiced.  Neurological:     Mental Status: She is alert. Mental status is at baseline.     Coordination: Coordination normal.    ED Results / Procedures / Treatments   Labs (all labs ordered are listed, but only abnormal results are displayed) Labs Reviewed  URINE CULTURE  URINALYSIS, ROUTINE W REFLEX MICROSCOPIC  PREGNANCY, URINE    EKG None  Radiology No results found.  Procedures Procedures     Medications Ordered in ED Medications - No data to display  ED Course/ Medical Decision Making/ A&P                           Medical Decision Making Amount and/or Complexity of Data Reviewed Labs: ordered.   This patient presents to the ED for concern of increased urinary frequency, this involves an extensive number of treatment options, and is a complaint that carries with it a high risk of complications and morbidity.  The differential diagnosis includes UTI, DKA, abdominal pressure, Pilo, other   Co morbidities that complicate the patient evaluation: N/A   Additional history obtained: -External records from outside source obtained and reviewed including: Chart review including previous notes, labs, imaging, consultation notes   Lab Tests: -I ordered, reviewed, and interpreted labs.  The pertinent results include: UA without any evidence of UTI.  Patient is urine POCT positive, she did recently undergo chemical abortion suspect this is a false positive.   I considered ordering additional lab work but given there is no abdominal tenderness on exam and no intra-abdominal pain I do not think this would be helpful.  Additionally her vitals are stable without any evidence of fever concerning for acute intra-abdominal process.  Imaging considered as well but given no focal tenderness I am not sure that would be helpful either.     ED Course: Her vitals are stable, not febrile.  No peritoneal signs on exam, no rigidity or guarding.  She not having any cervical tenderness, vaginal tenderness, doubt this is PID.  Genital exam does not reveal any Bartholin abscess amenable to I&D, there is no discharge, no tenderness.    I considered but think less likely acute process such as ovarian cyst, septic abortion, ovarian torsion, ectopic pregnancy.  There is a slight possible lesion to the right labia but unclear etiology.  Doubt any acute process at this time, discussed my differential  with patient and advised her to follow-up with gynecology for additional evaluation if her symptoms persist.  Do not think she needs any antibiotics at this time.   Reevaluation: After the interventions noted above, I reevaluated the patient and found that they have :stayed the same   Dispostion: Follow up with gynecology. Try phenazopyridine with dysuria symptoms.          Final Clinical Impression(s) / ED Diagnoses Final diagnoses:  None    Rx / DC Orders ED Discharge Orders     None  Sherrill Raring, PA-C 06/30/21 1912    Gareth Morgan, MD 07/01/21 1021

## 2021-06-30 NOTE — ED Triage Notes (Signed)
Pt c/o urinary freq and a "lump on the inside" of vagina-NAD-steady gait

## 2021-06-30 NOTE — Discharge Instructions (Addendum)
Follow up with gynecologist if vaginal bump persists. Return to ED if it grows, is accompanied by fever, or is severely painful.  All your work-up does not show any signs of urinary tract infection, try taking the Pyridium for the symptoms and see if this improves swelling.

## 2021-06-30 NOTE — ED Notes (Signed)
D/c paperwork reviewed with pt including prescriptions and f/u care. Pt verbalized understanding, all questions addressed by provider. Pt ambulatory to ED exit.

## 2021-07-01 ENCOUNTER — Telehealth: Payer: Self-pay

## 2021-07-01 DIAGNOSIS — Z9189 Other specified personal risk factors, not elsewhere classified: Secondary | ICD-10-CM

## 2021-07-01 NOTE — Telephone Encounter (Signed)
Transition Care Management Unsuccessful Follow-up Telephone Call  Date of discharge and from where:  06/30/2021-[facility]  Attempts:  1st Attempt  Reason for unsuccessful TCM follow-up call:  Left voice message

## 2021-07-02 LAB — URINE CULTURE: Culture: 60000 — AB

## 2021-07-02 NOTE — Telephone Encounter (Signed)
Transition Care Management Unsuccessful Follow-up Telephone Call  Date of discharge and from where:  06/30/2021[facility]  Attempts:  2nd Attempt  Reason for unsuccessful TCM follow-up call:  Left voice message

## 2021-07-05 ENCOUNTER — Other Ambulatory Visit (HOSPITAL_COMMUNITY)
Admission: RE | Admit: 2021-07-05 | Discharge: 2021-07-05 | Disposition: A | Discharge status: Home / Self Care | Payer: Medicaid Other | Source: Ambulatory Visit | Attending: Obstetrics and Gynecology | Admitting: Obstetrics and Gynecology

## 2021-07-05 ENCOUNTER — Ambulatory Visit (INDEPENDENT_AMBULATORY_CARE_PROVIDER_SITE_OTHER): Payer: Medicaid Other | Admitting: Obstetrics and Gynecology

## 2021-07-05 ENCOUNTER — Encounter: Payer: Self-pay | Admitting: Obstetrics and Gynecology

## 2021-07-05 ENCOUNTER — Other Ambulatory Visit: Payer: Self-pay

## 2021-07-05 VITALS — BP 134/85 | HR 99 | Ht 60.0 in | Wt 132.0 lb

## 2021-07-05 DIAGNOSIS — N939 Abnormal uterine and vaginal bleeding, unspecified: Secondary | ICD-10-CM | POA: Diagnosis not present

## 2021-07-05 DIAGNOSIS — Z01411 Encounter for gynecological examination (general) (routine) with abnormal findings: Secondary | ICD-10-CM | POA: Diagnosis not present

## 2021-07-05 NOTE — Telephone Encounter (Signed)
Transition Care Management Follow-up Telephone Call Date of discharge and from where: 06/30/2021-[facility]  How have you been since you were released from the hospital? Pt stated she is feeling better.  Any questions or concerns? No  Items Reviewed: Did the pt receive and understand the discharge instructions provided? Yes  Medications obtained and verified? Yes  Other? No  Any new allergies since your discharge? No  Dietary orders reviewed? No Do you have support at home? Yes   Home Care and Equipment/Supplies: Were home health services ordered? not applicable If so, what is the name of the agency? N/A  Has the agency set up a time to come to the patient's home? not applicable Were any new equipment or medical supplies ordered?  No What is the name of the medical supply agency? N/A Were you able to get the supplies/equipment? not applicable Do you have any questions related to the use of the equipment or supplies? No  Functional Questionnaire: (I = Independent and D = Dependent) ADLs: I  Bathing/Dressing- I  Meal Prep- I  Eating- I  Maintaining continence- I  Transferring/Ambulation- I  Managing Meds- I  Follow up appointments reviewed:  PCP Hospital f/u appt confirmed? No   Specialist Hospital f/u appt confirmed? Yes  Scheduled to see OBYGN on 07/05/2021 @ 3:10PM. Are transportation arrangements needed? No  If their condition worsens, is the pt aware to call PCP or go to the Emergency Dept.? Yes Was the patient provided with contact information for the PCP's office or ED? Yes Was to pt encouraged to call back with questions or concerns? Yes

## 2021-07-05 NOTE — Progress Notes (Addendum)
GYN presents for PAP and cyst, pain 8/10.  Patient had an Abortion 06/10/21.

## 2021-07-05 NOTE — Progress Notes (Signed)
Subjective:     Andrea Carson is a 26 y.o. female P2 and BMI 25 who is here for a comprehensive physical exam. The patient reports some lower abdominal pain which has been present since her termination of pregnancy on 06/10/21. Patient denies fever or abnormal discharge. She was seen in the ED a few weeks ago and had normal STI testing. A small cyst was visualized in her vagina. Patient is not currently sexually active. She reports heavy vaginal bleeding on 06/27/21 which she believes to be her cycle. She is without any other complaints  Past Medical History:  Diagnosis Date   BV (bacterial vaginosis)    Constipation    Eczema    Eczema    Environmental allergies    Gall stones    Heart murmur    pt reports as a child-but has resolved   Infection    UTI   Seasonal allergies    Vaginal delivery 05/31/2014   Past Surgical History:  Procedure Laterality Date   ADENOIDECTOMY     CHOLECYSTECTOMY N/A 07/31/2014   Procedure: LAPAROSCOPIC CHOLECYSTECTOMY WITH INTRAOPERATIVE CHOLANGIOGRAM;  Surgeon: Avel Peace, MD;  Location: [facility];  Service: General;  Laterality: N/A;   THERAPEUTIC ABORTION     x3   TONSILLECTOMY     Family History  Problem Relation Age of Onset   Hypertension Maternal Grandmother    Cancer Maternal Grandmother    Hypertension Mother    Cancer Maternal Aunt    Anxiety disorder Maternal Uncle    Diabetes Paternal Grandmother    Social History   Socioeconomic History   Marital status: Single    Spouse name: Not on file   Number of children: Not on file   Years of education: Not on file   Highest education level: Not on file  Occupational History   Not on file  Tobacco Use   Smoking status: Never   Smokeless tobacco: Never  Vaping Use   Vaping Use: Former  Substance and Sexual Activity   Alcohol use: Not Currently   Drug use: Yes    Types: Marijuana   Sexual activity: Yes    Birth control/protection: None  Other Topics Concern   Not on file  Social  History Narrative   Not on file   Social Determinants of Health   Financial Resource Strain: Not on file  Food Insecurity: Not on file  Transportation Needs: Not on file  Physical Activity: Not on file  Stress: Not on file  Social Connections: Not on file  Intimate Partner Violence: Not on file   Health Maintenance  Topic Date Due   COVID-19 Vaccine (1) Never done   HPV VACCINES (1 - 2-dose series) Never done   Hepatitis C Screening  Never done   PAP-Cervical Cytology Screening  Never done   PAP SMEAR-Modifier  Never done   INFLUENZA VACCINE  Never done   CHLAMYDIA SCREENING  06/22/2022   TETANUS/TDAP  07/08/2030   HIV Screening  Completed       Review of Systems Pertinent items noted in HPI and remainder of comprehensive ROS otherwise negative.   Objective:  Blood pressure 134/85, pulse 99, height 5' (1.524 m), weight 132 lb (59.9 kg), last menstrual period 06/27/2021, not currently breastfeeding.   GENERAL: Well-developed, well-nourished female in no acute distress.  HEENT: Normocephalic, atraumatic. Sclerae anicteric.  NECK: Supple. Normal thyroid.  LUNGS: Clear to auscultation bilaterally.  HEART: Regular rate and rhythm. BREASTS: Symmetric in size. No palpable masses or lymphadenopathy,  skin changes, or nipple drainage. ABDOMEN: Soft, nontender, nondistended. No organomegaly. PELVIC: Normal external female genitalia. Vagina is pink and rugated with small 0.5 cm inclusion cyst at 2 o'clock near hymenal ring remnant.  Normal discharge. Normal appearing cervix. Uterus is normal in size. No adnexal mass or tenderness. Chaperone present during the pelvic exam EXTREMITIES: No cyanosis, clubbing, or edema, 2+ distal pulses.     Assessment:    Healthy female exam.      Plan:    Pap smear collected STI testing recently done in urgent care on 06/22/21 Patient is considering Nexplanon for contraception and plans to return for placement if desired Quant HCG today to  ensure resolution of previous pregnancy Patient will be contacted with abnormal results Reassurance provided regarding inclusion cyst See After Visit Summary for Counseling Recommendations

## 2021-07-06 LAB — CYTOLOGY - PAP: Diagnosis: NEGATIVE

## 2021-07-06 LAB — BETA HCG QUANT (REF LAB): hCG Quant: 10 m[IU]/mL

## 2021-07-21 ENCOUNTER — Encounter: Payer: Self-pay | Admitting: Obstetrics and Gynecology

## 2021-07-26 ENCOUNTER — Telehealth: Payer: Self-pay

## 2021-07-26 NOTE — Telephone Encounter (Signed)
° °  Telephone encounter was:  Successful.  07/26/2021 Name: Andrea Carson MRN: [id number] DOB: 04-30-1996  Andrea Carson is a 26 y.o. year old female who is a primary care patient of Pcp, No . The community resource team was consulted for assistance with  PCP Search  Care guide performed the following interventions:  Pt still needing PCP. Pt was unable to finish conversation, however, I was able to send over PCP information for [facility] and Medicaid by e-mail. Pt has been advised and been educated on how to register for new patient appointments.  Patient has also been sent information on determining a Medicaid patients primary care provider document just in case she has a PCP already assigned to her.  Follow Up Plan:  No further follow up planned at this time. The patient has been provided with needed resources.  [facility] Guide, Embedded Care Coordination [facility]  [place], [place] [address]  Main Phone: [phone]   E-mail: [email or web address]  Website: [email or web address]

## 2021-08-12 ENCOUNTER — Ambulatory Visit: Payer: Medicaid Other | Admitting: Obstetrics

## 2021-08-18 DIAGNOSIS — S0990XA Unspecified injury of head, initial encounter: Secondary | ICD-10-CM | POA: Diagnosis not present

## 2021-08-18 DIAGNOSIS — S0101XA Laceration without foreign body of scalp, initial encounter: Secondary | ICD-10-CM | POA: Diagnosis not present

## 2021-08-23 DIAGNOSIS — B9689 Other specified bacterial agents as the cause of diseases classified elsewhere: Secondary | ICD-10-CM | POA: Diagnosis not present

## 2021-08-23 DIAGNOSIS — N760 Acute vaginitis: Secondary | ICD-10-CM | POA: Diagnosis not present

## 2021-09-03 DIAGNOSIS — N949 Unspecified condition associated with female genital organs and menstrual cycle: Secondary | ICD-10-CM | POA: Diagnosis not present

## 2021-09-03 DIAGNOSIS — N898 Other specified noninflammatory disorders of vagina: Secondary | ICD-10-CM | POA: Diagnosis not present

## 2021-09-08 ENCOUNTER — Ambulatory Visit: Payer: Medicaid Other | Admitting: Obstetrics

## 2021-09-15 ENCOUNTER — Ambulatory Visit
Admission: EM | Admit: 2021-09-15 | Discharge: 2021-09-15 | Disposition: A | Discharge status: Home / Self Care | Payer: Medicaid Other | Attending: Internal Medicine | Admitting: Internal Medicine

## 2021-09-15 DIAGNOSIS — R112 Nausea with vomiting, unspecified: Secondary | ICD-10-CM | POA: Diagnosis not present

## 2021-09-15 DIAGNOSIS — R197 Diarrhea, unspecified: Secondary | ICD-10-CM

## 2021-09-15 MED ORDER — ONDANSETRON 4 MG PO TBDP
4.0000 mg | ORAL_TABLET | Freq: Three times a day (TID) | ORAL | 0 refills | Status: DC | PRN
Start: 2021-09-15 — End: 2023-01-01

## 2021-09-15 NOTE — ED Provider Notes (Signed)
?[name] ? ? ? ?CSN: [id number] ?Arrival date & time: 09/15/21  1733 ? ? ?  ? ?History   ?Chief Complaint ?Chief Complaint  ?Patient presents with  ? Emesis  ? ? ?HPI ?Andrea Carson is a 26 y.o. female.  ? ?Patient presents with nausea, vomiting, diarrhea that started a few days prior.  Patient denies any known sick contacts or fevers.  She does report some associated body aches as well.  Patient reports that symptoms started after eating out at a restaurant.  Denies any associated upper respiratory symptoms or sore throat.  Patient has been able to keep fluids down.  She reports that symptoms have started to slow down and denies any blood in stool or emesis.  Patient reports that she is mainly here for a work note due to missing work.  Patient adamantly denies chance of pregnancy and states that she took a pregnancy test last week that was negative. ? ? ?Emesis ? ?Past Medical History:  ?Diagnosis Date  ? BV (bacterial vaginosis)   ? Constipation   ? Eczema   ? Eczema   ? Environmental allergies   ? Gall stones   ? Heart murmur   ? pt reports as a child-but has resolved  ? Infection   ? UTI  ? Seasonal allergies   ? Vaginal delivery 05/31/2014  ? ? ?Patient Active Problem List  ? Diagnosis Date Noted  ? Mild preeclampsia 09/15/2020  ? Hx laparoscopic cholecystectomy 08/06/2020  ? History of 2019 novel coronavirus disease (COVID-19) 08/06/2020  ? ? ?Past Surgical History:  ?Procedure Laterality Date  ? ADENOIDECTOMY    ? CHOLECYSTECTOMY N/A 07/31/2014  ? Procedure: LAPAROSCOPIC CHOLECYSTECTOMY WITH INTRAOPERATIVE CHOLANGIOGRAM;  Surgeon: Avel Peace, MD;  Location: [facility];  Service: General;  Laterality: N/A;  ? THERAPEUTIC ABORTION    ? x3  ? TONSILLECTOMY    ? ? ?OB History   ? ? Gravida  ?8  ? Para  ?2  ? Term  ?2  ? Preterm  ?0  ? AB  ?5  ? Living  ?2  ?  ? ? SAB  ?0  ? IAB  ?5  ? Ectopic  ?0  ? Multiple  ?0  ? Live Births  ?2  ?   ?  ?  ? ? ? ?Home Medications   ? ?Prior to Admission medications    ?Medication Sig Start Date End Date Taking? Authorizing Provider  ?ondansetron (ZOFRAN-ODT) 4 MG disintegrating tablet Take 1 tablet (4 mg total) by mouth every 8 (eight) hours as needed for nausea or vomiting. 09/15/21  Yes Gustavus Bryant, FNP  ?acetaminophen (TYLENOL) 325 MG tablet Take 2 tablets (650 mg total) by mouth every 4 (four) hours as needed (for pain scale < 4). ?Patient not taking: Reported on 11/17/2020 09/16/20   Gita Kudo, MD  ?Doxylamine-Pyridoxine 10-10 MG TBEC Take 2 tablets by mouth at bedtime. ?Patient not taking: Reported on 07/05/2021 03/31/21   Gerrit Heck, CNM  ?metoCLOPramide (REGLAN) 10 MG tablet Take 1 tablet (10 mg total) by mouth every 6 (six) hours. ?Patient not taking: Reported on 07/05/2021 03/31/21   Gerrit Heck, CNM  ?metroNIDAZOLE (FLAGYL) 500 MG tablet Take 1 tablet (500 mg total) by mouth 2 (two) times daily. ?Patient not taking: Reported on 07/05/2021 06/22/21   Tomi Bamberger, PA-C  ?nitrofurantoin, macrocrystal-monohydrate, (MACROBID) 100 MG capsule Take 1 capsule (100 mg total) by mouth 2 (two) times daily. ?Patient not taking: Reported on 07/05/2021  05/11/21   Tomi Bamberger, PA-C  ?phenazopyridine (PYRIDIUM) 200 MG tablet Take 1 tablet (200 mg total) by mouth 3 (three) times daily. ?Patient not taking: Reported on 07/05/2021 06/30/21   Theron Arista, PA-C  ?promethazine (PHENERGAN) 12.5 MG tablet Take 1 tablet (12.5 mg total) by mouth at bedtime. 04/13/19 12/23/19  Marylene Land, CNM  ? ? ?Family History ?Family History  ?Problem Relation Age of Onset  ? Hypertension Maternal Grandmother   ? Cancer Maternal Grandmother   ? Hypertension Mother   ? Cancer Maternal Aunt   ? Anxiety disorder Maternal Uncle   ? Diabetes Paternal Grandmother   ? ? ?Social History ?Social History  ? ?Tobacco Use  ? Smoking status: Never  ? Smokeless tobacco: Never  ?Vaping Use  ? Vaping Use: Former  ?Substance Use Topics  ? Alcohol use: Not Currently  ? Drug use: Yes  ?  Types:  Marijuana  ? ? ? ?Allergies   ?Bee venom, Fire ant, and Amoxicillin ? ? ?Review of Systems ?Review of Systems ?Per HPI ? ?Physical Exam ?Triage Vital Signs ?ED Triage Vitals [09/15/21 1841]  ?Enc Vitals Group  ?   BP 113/76  ?   Pulse Rate 67  ?   Resp 18  ?   Temp 98.7 ?F (37.1 ?C)  ?   Temp Source Oral  ?   SpO2 98 %  ?   Weight   ?   Height   ?   Head Circumference   ?   Peak Flow   ?   Pain Score 0  ?   Pain Loc   ?   Pain Edu?   ?   Excl. in GC?   ? ?No data found. ? ?Updated Vital Signs ?BP 113/76 (BP Location: Left Arm)   Pulse 67   Temp 98.7 ?F (37.1 ?C) (Oral)   Resp 18   SpO2 98%   Breastfeeding No  ? ?Visual Acuity ?Right Eye Distance:   ?Left Eye Distance:   ?Bilateral Distance:   ? ?Right Eye Near:   ?Left Eye Near:    ?Bilateral Near:    ? ?Physical Exam ?Constitutional:   ?   General: She is not in acute distress. ?   Appearance: Normal appearance. She is not toxic-appearing or diaphoretic.  ?HENT:  ?   Head: Normocephalic and atraumatic.  ?   Mouth/Throat:  ?   Mouth: Mucous membranes are moist.  ?   Pharynx: No posterior oropharyngeal erythema.  ?Eyes:  ?   Extraocular Movements: Extraocular movements intact.  ?   Conjunctiva/sclera: Conjunctivae normal.  ?Cardiovascular:  ?   Rate and Rhythm: Normal rate and regular rhythm.  ?   Pulses: Normal pulses.  ?   Heart sounds: Normal heart sounds.  ?Pulmonary:  ?   Effort: Pulmonary effort is normal. No respiratory distress.  ?   Breath sounds: Normal breath sounds.  ?Abdominal:  ?   General: Abdomen is flat. Bowel sounds are normal. There is no distension.  ?   Palpations: Abdomen is soft.  ?   Tenderness: There is no abdominal tenderness.  ?Neurological:  ?   General: No focal deficit present.  ?   Mental Status: She is alert and oriented to person, place, and time. Mental status is at baseline.  ?Psychiatric:     ?   Mood and Affect: Mood normal.     ?   Behavior: Behavior normal.     ?   Thought  Content: Thought content normal.     ?   Judgment:  Judgment normal.  ? ? ? ?UC Treatments / Results  ?Labs ?(all labs ordered are listed, but only abnormal results are displayed) ?Labs Reviewed - No data to display ? ?EKG ? ? ?Radiology ?No results found. ? ?Procedures ?Procedures (including critical care time) ? ?Medications Ordered in UC ?Medications - No data to display ? ?Initial Impression / Assessment and Plan / UC Course  ?I have reviewed the triage vital signs and the nursing notes. ? ?Pertinent labs & imaging results that were available during my care of the patient were reviewed by me and considered in my medical decision making (see chart for details). ? ?  ? ?Symptoms appear viral in etiology.  Will prescribe ondansetron to take as needed for nausea.  Symptoms appear to be improving per patient report.  Discussed return and ER precautions.  Patient verbalized understanding and was agreeable with plan. ?Final Clinical Impressions(s) / UC Diagnoses  ? ?Final diagnoses:  ?Nausea vomiting and diarrhea  ? ? ? ?Discharge Instructions   ? ?  ?You have been prescribed a nausea medication to take as needed.  Please ensure adequate fluid hydration.  Go to the hospital if symptoms persist or worsen. ? ? ? ?ED Prescriptions   ? ? Medication Sig Dispense Auth. Provider  ? ondansetron (ZOFRAN-ODT) 4 MG disintegrating tablet Take 1 tablet (4 mg total) by mouth every 8 (eight) hours as needed for nausea or vomiting. 20 tablet Ervin KnackMound, Alysiah Suppa E, [place]NP  ? ?  ? ?PDMP not reviewed this encounter. ?  ?Gustavus BryantMound, Ciarrah Rae E, [place]FNP ?09/15/21 1851 ? ?

## 2021-09-15 NOTE — ED Triage Notes (Signed)
Pt c/o nausea, vomiting (not today), fatigue, diarrhea, headache, onset Sunday night after eating out with family.  ?

## 2021-09-15 NOTE — Discharge Instructions (Addendum)
You have been prescribed a nausea medication to take as needed.  Please ensure adequate fluid hydration.  Go to the hospital if symptoms persist or worsen. ?

## 2021-10-28 DIAGNOSIS — J029 Acute pharyngitis, unspecified: Secondary | ICD-10-CM | POA: Diagnosis not present

## 2021-11-11 DIAGNOSIS — Z113 Encounter for screening for infections with a predominantly sexual mode of transmission: Secondary | ICD-10-CM | POA: Diagnosis not present

## 2021-11-11 DIAGNOSIS — R102 Pelvic and perineal pain: Secondary | ICD-10-CM | POA: Diagnosis not present

## 2021-12-23 DIAGNOSIS — Z3202 Encounter for pregnancy test, result negative: Secondary | ICD-10-CM | POA: Diagnosis not present

## 2021-12-23 DIAGNOSIS — N898 Other specified noninflammatory disorders of vagina: Secondary | ICD-10-CM | POA: Diagnosis not present

## 2022-01-04 DIAGNOSIS — Z419 Encounter for procedure for purposes other than remedying health state, unspecified: Secondary | ICD-10-CM | POA: Diagnosis not present

## 2022-01-09 DIAGNOSIS — N760 Acute vaginitis: Secondary | ICD-10-CM | POA: Diagnosis not present

## 2022-01-09 DIAGNOSIS — B9689 Other specified bacterial agents as the cause of diseases classified elsewhere: Secondary | ICD-10-CM | POA: Diagnosis not present

## 2022-02-04 DIAGNOSIS — Z419 Encounter for procedure for purposes other than remedying health state, unspecified: Secondary | ICD-10-CM | POA: Diagnosis not present

## 2022-03-06 DIAGNOSIS — N760 Acute vaginitis: Secondary | ICD-10-CM | POA: Diagnosis not present

## 2022-03-06 DIAGNOSIS — Z419 Encounter for procedure for purposes other than remedying health state, unspecified: Secondary | ICD-10-CM | POA: Diagnosis not present

## 2022-03-06 DIAGNOSIS — R21 Rash and other nonspecific skin eruption: Secondary | ICD-10-CM | POA: Diagnosis not present

## 2022-03-06 DIAGNOSIS — F17210 Nicotine dependence, cigarettes, uncomplicated: Secondary | ICD-10-CM | POA: Diagnosis not present

## 2022-03-29 DIAGNOSIS — Z20822 Contact with and (suspected) exposure to covid-19: Secondary | ICD-10-CM | POA: Diagnosis not present

## 2022-03-29 DIAGNOSIS — J029 Acute pharyngitis, unspecified: Secondary | ICD-10-CM | POA: Diagnosis not present

## 2022-04-06 DIAGNOSIS — Z419 Encounter for procedure for purposes other than remedying health state, unspecified: Secondary | ICD-10-CM | POA: Diagnosis not present

## 2022-05-05 DIAGNOSIS — F17210 Nicotine dependence, cigarettes, uncomplicated: Secondary | ICD-10-CM | POA: Diagnosis not present

## 2022-05-05 DIAGNOSIS — B9689 Other specified bacterial agents as the cause of diseases classified elsewhere: Secondary | ICD-10-CM | POA: Diagnosis not present

## 2022-05-05 DIAGNOSIS — N760 Acute vaginitis: Secondary | ICD-10-CM | POA: Diagnosis not present

## 2022-05-05 DIAGNOSIS — Z113 Encounter for screening for infections with a predominantly sexual mode of transmission: Secondary | ICD-10-CM | POA: Diagnosis not present

## 2022-05-05 DIAGNOSIS — M545 Low back pain, unspecified: Secondary | ICD-10-CM | POA: Diagnosis not present

## 2022-05-06 DIAGNOSIS — Z419 Encounter for procedure for purposes other than remedying health state, unspecified: Secondary | ICD-10-CM | POA: Diagnosis not present

## 2022-05-17 DIAGNOSIS — S199XXA Unspecified injury of neck, initial encounter: Secondary | ICD-10-CM | POA: Diagnosis not present

## 2022-05-17 DIAGNOSIS — S3991XA Unspecified injury of abdomen, initial encounter: Secondary | ICD-10-CM | POA: Diagnosis not present

## 2022-08-09 ENCOUNTER — Encounter: Payer: Self-pay | Admitting: Obstetrics and Gynecology

## 2022-08-09 ENCOUNTER — Other Ambulatory Visit (HOSPITAL_COMMUNITY)
Admission: RE | Admit: 2022-08-09 | Discharge: 2022-08-09 | Disposition: A | Discharge status: Home / Self Care | Payer: Medicaid Other | Source: Ambulatory Visit | Attending: Obstetrics and Gynecology | Admitting: Obstetrics and Gynecology

## 2022-08-09 ENCOUNTER — Ambulatory Visit (INDEPENDENT_AMBULATORY_CARE_PROVIDER_SITE_OTHER): Payer: Medicaid Other | Admitting: Obstetrics and Gynecology

## 2022-08-09 VITALS — BP 104/71 | HR 76 | Ht 60.0 in | Wt 136.3 lb

## 2022-08-09 DIAGNOSIS — N898 Other specified noninflammatory disorders of vagina: Secondary | ICD-10-CM | POA: Diagnosis not present

## 2022-08-09 DIAGNOSIS — Z113 Encounter for screening for infections with a predominantly sexual mode of transmission: Secondary | ICD-10-CM | POA: Diagnosis not present

## 2022-08-09 DIAGNOSIS — N930 Postcoital and contact bleeding: Secondary | ICD-10-CM

## 2022-08-09 NOTE — Progress Notes (Signed)
Pt presents for vaginal pain, swelling, and bleeding with intercourse. Concerned for vaginal cyst for about a year.   Reports heavier and more painful periods in the last year.  Desires STD screen. Recently diagnosed with chlamydia, finished tx 2 weeks ago.

## 2022-08-09 NOTE — Progress Notes (Signed)
27 yo P2 here for STI screening and evaluation of vaginal cyst. Patient is without any complaints. She reports a monthly period. She is sexually active without contraception and would welcome a pregnancy if it occurred. She denies pelvic pain or abnormal discharge. She recently completed treatment for chlamydia. She desires STI testing.   Past Medical History:  Diagnosis Date   BV (bacterial vaginosis)    Constipation    Eczema    Eczema    Environmental allergies    Gall stones    Heart murmur    pt reports as a child-but has resolved   Infection    UTI   Seasonal allergies    Vaginal delivery 05/31/2014   Past Surgical History:  Procedure Laterality Date   ADENOIDECTOMY     CHOLECYSTECTOMY N/A 07/31/2014   Procedure: LAPAROSCOPIC CHOLECYSTECTOMY WITH INTRAOPERATIVE CHOLANGIOGRAM;  Surgeon: [name], MD;  Location: [facility];  Service: General;  Laterality: N/A;   THERAPEUTIC ABORTION     x3   TONSILLECTOMY     Family History  Problem Relation Age of Onset   Hypertension Maternal Grandmother    Cancer Maternal Grandmother    Hypertension Mother    Cancer Maternal Aunt    Anxiety disorder Maternal Uncle    Diabetes Paternal Grandmother    Social History   Tobacco Use   Smoking status: Never   Smokeless tobacco: Never  Vaping Use   Vaping Use: Former  Substance Use Topics   Alcohol use: Not Currently   Drug use: Yes    Types: Marijuana   ROS See pertinent in HPI. All other systems reviewed and non contributory Blood pressure 104/71, pulse 76, height 5' (1.524 m), weight 136 lb 4.8 oz (61.8 kg), last menstrual period 07/25/2022, not currently breastfeeding. GENERAL: Well-developed, well-nourished female in no acute distress.  ABDOMEN: Soft, nontender, nondistended. No organomegaly. PELVIC: Normal external female genitalia. Vagina is pink and rugated. 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  Normal discharge. Normal appearing  cervix. Uterus is normal in size. No adnexal mass or tenderness. Chaperone present during the pelvic exam EXTREMITIES: No cyanosis, clubbing, or edema, 2+ distal pulses.  A/P 27 yo P2 here for STI testing - Reassurance provided regarding area of concern on hymenal ring remnant - Vaginal swab collected - Patient will be contacted with abnormal results - Patient is current with pap smear - Patient declined contraception

## 2022-08-10 LAB — RPR: RPR Ser Ql: NONREACTIVE

## 2022-08-10 LAB — CERVICOVAGINAL ANCILLARY ONLY
Bacterial Vaginitis (gardnerella): NEGATIVE
Candida Glabrata: NEGATIVE
Candida Vaginitis: NEGATIVE
Chlamydia: NEGATIVE
Comment: NEGATIVE
Comment: NEGATIVE
Comment: NEGATIVE
Comment: NEGATIVE
Comment: NEGATIVE
Comment: NORMAL
Neisseria Gonorrhea: NEGATIVE
Trichomonas: NEGATIVE

## 2022-08-10 LAB — HEPATITIS B SURFACE ANTIGEN: Hepatitis B Surface Ag: NEGATIVE

## 2022-08-10 LAB — HIV ANTIBODY (ROUTINE TESTING W REFLEX): HIV Screen 4th Generation wRfx: NONREACTIVE

## 2022-08-10 LAB — HEPATITIS C ANTIBODY: Hep C Virus Ab: NONREACTIVE

## 2022-09-11 ENCOUNTER — Emergency Department (HOSPITAL_BASED_OUTPATIENT_CLINIC_OR_DEPARTMENT_OTHER)
Admission: EM | Admit: 2022-09-11 | Discharge: 2022-09-11 | Disposition: A | Discharge status: Home / Self Care | Payer: Medicaid Other | Attending: Emergency Medicine | Admitting: Emergency Medicine

## 2022-09-11 ENCOUNTER — Encounter (HOSPITAL_BASED_OUTPATIENT_CLINIC_OR_DEPARTMENT_OTHER): Payer: Self-pay | Admitting: Emergency Medicine

## 2022-09-11 ENCOUNTER — Other Ambulatory Visit: Payer: Self-pay

## 2022-09-11 DIAGNOSIS — E876 Hypokalemia: Secondary | ICD-10-CM

## 2022-09-11 DIAGNOSIS — Z3A01 Less than 8 weeks gestation of pregnancy: Secondary | ICD-10-CM | POA: Insufficient documentation

## 2022-09-11 DIAGNOSIS — O219 Vomiting of pregnancy, unspecified: Secondary | ICD-10-CM | POA: Insufficient documentation

## 2022-09-11 DIAGNOSIS — O99281 Endocrine, nutritional and metabolic diseases complicating pregnancy, first trimester: Secondary | ICD-10-CM | POA: Diagnosis not present

## 2022-09-11 DIAGNOSIS — E860 Dehydration: Secondary | ICD-10-CM | POA: Insufficient documentation

## 2022-09-11 LAB — URINALYSIS, ROUTINE W REFLEX MICROSCOPIC
Glucose, UA: NEGATIVE mg/dL
Hgb urine dipstick: NEGATIVE
Ketones, ur: 80 mg/dL — AB
Leukocytes,Ua: NEGATIVE
Nitrite: NEGATIVE
Protein, ur: 100 mg/dL — AB
Specific Gravity, Urine: >=1.03 (ref 1.005–1.030)
pH: 6 (ref 5.0–8.0)

## 2022-09-11 LAB — URINALYSIS, MICROSCOPIC (REFLEX)

## 2022-09-11 LAB — CBC WITH DIFFERENTIAL/PLATELET
Abs Immature Granulocytes: 0.01 10*3/uL (ref 0.00–0.07)
Basophils Absolute: 0.1 10*3/uL (ref 0.0–0.1)
Basophils Relative: 1 %
Eosinophils Absolute: 0 10*3/uL (ref 0.0–0.5)
Eosinophils Relative: 0 %
HCT: 34.6 % — ABNORMAL LOW (ref 36.0–46.0)
Hemoglobin: 12.3 g/dL (ref 12.0–15.0)
Immature Granulocytes: 0 %
Lymphocytes Relative: 27 %
Lymphs Abs: 2.7 10*3/uL (ref 0.7–4.0)
MCH: 32.8 pg (ref 26.0–34.0)
MCHC: 35.5 g/dL (ref 30.0–36.0)
MCV: 92.3 fL (ref 80.0–100.0)
Monocytes Absolute: 0.9 10*3/uL (ref 0.1–1.0)
Monocytes Relative: 9 %
Neutro Abs: 6.3 10*3/uL (ref 1.7–7.7)
Neutrophils Relative %: 63 %
Platelets: 210 10*3/uL (ref 150–400)
RBC: 3.75 MIL/uL — ABNORMAL LOW (ref 3.87–5.11)
RDW: 11.7 % (ref 11.5–15.5)
WBC: 9.9 10*3/uL (ref 4.0–10.5)
nRBC: 0 % (ref 0.0–0.2)

## 2022-09-11 LAB — COMPREHENSIVE METABOLIC PANEL
ALT: 31 U/L (ref 0–44)
AST: 25 U/L (ref 15–41)
Albumin: 4.3 g/dL (ref 3.5–5.0)
Alkaline Phosphatase: 52 U/L (ref 38–126)
Anion gap: 10 (ref 5–15)
BUN: 11 mg/dL (ref 6–20)
CO2: 23 mmol/L (ref 22–32)
Calcium: 8.9 mg/dL (ref 8.9–10.3)
Chloride: 100 mmol/L (ref 98–111)
Creatinine, Ser: 0.58 mg/dL (ref 0.44–1.00)
GFR, Estimated: >60 mL/min (ref >60)
Glucose, Bld: 76 mg/dL (ref 70–99)
Potassium: 3 mmol/L — ABNORMAL LOW (ref 3.5–5.1)
Sodium: 133 mmol/L — ABNORMAL LOW (ref 135–145)
Total Bilirubin: 1.1 mg/dL (ref 0.3–1.2)
Total Protein: 7.3 g/dL (ref 6.5–8.1)

## 2022-09-11 LAB — PREGNANCY, URINE: Preg Test, Ur: POSITIVE — AB

## 2022-09-11 MED ORDER — LACTATED RINGERS IV BOLUS
1000.0000 mL | Freq: Once | INTRAVENOUS | Status: AC
  Administered 2022-09-11: 1000 mL via INTRAVENOUS

## 2022-09-11 MED ORDER — ONDANSETRON 4 MG PO TBDP: 4.0000 mg | ORAL_TABLET | Freq: Three times a day (TID) | ORAL | 0 refills | Status: DC | PRN

## 2022-09-11 MED ORDER — ONDANSETRON HCL 4 MG/2ML IJ SOLN
4.0000 mg | Freq: Once | INTRAMUSCULAR | Status: AC
  Administered 2022-09-11: 4 mg via INTRAVENOUS
  Filled 2022-09-11: qty 2

## 2022-09-11 MED ORDER — POTASSIUM CHLORIDE CRYS ER 20 MEQ PO TBCR
40.0000 meq | EXTENDED_RELEASE_TABLET | Freq: Once | ORAL | Status: DC
  Filled 2022-09-11: qty 2

## 2022-09-11 NOTE — ED Triage Notes (Signed)
Pt is about [redacted] wks pregnant w/ NV x 5d

## 2022-09-11 NOTE — ED Provider Notes (Signed)
[place] [facility] Provider Note   CSN: [id number] Arrival date & time: 09/11/22  1518     History Chief Complaint  Patient presents with   Emesis During Pregnancy    Andrea Carson is a 27 y.o. female.  Patient presents emergency department complaints of vomiting.  She reports that she is currently [redacted] weeks pregnant.  She is unable to tell me how any previous pregnancies she has had and states that she has had "a lot" but has 2 living children.  Patient reports that she has been having vomiting for the last 5 days and has been somewhat considerable.  Denies any blood in the vomit.  States that she does use marijuana and has previously been admitted for cannabinoid hyperemesis syndrome.  She states that she last used marijuana earlier today.  Denies any abdominal pain, spotting, pelvic pain.  HPI     Home Medications Prior to Admission medications   Medication Sig Start Date End Date Taking? Authorizing Provider  ondansetron (ZOFRAN-ODT) 4 MG disintegrating tablet Take 1 tablet (4 mg total) by mouth every 8 (eight) hours as needed for nausea or vomiting. 09/11/22  Yes Smitty Knudsen, PA-C  acetaminophen (TYLENOL) 325 MG tablet Take 2 tablets (650 mg total) by mouth every 4 (four) hours as needed (for pain scale < 4). Patient not taking: Reported on 11/17/2020 09/16/20   Gita Kudo, MD  Doxylamine-Pyridoxine 10-10 MG TBEC Take 2 tablets by mouth at bedtime. Patient not taking: Reported on 07/05/2021 03/31/21   Gerrit Heck, CNM  metoCLOPramide (REGLAN) 10 MG tablet Take 1 tablet (10 mg total) by mouth every 6 (six) hours. Patient not taking: Reported on 07/05/2021 03/31/21   Gerrit Heck, CNM  metroNIDAZOLE (FLAGYL) 500 MG tablet Take 1 tablet (500 mg total) by mouth 2 (two) times daily. Patient not taking: Reported on 07/05/2021 06/22/21   Tomi Bamberger, PA-C  nitrofurantoin, macrocrystal-monohydrate, (MACROBID) 100 MG capsule Take 1 capsule (100  mg total) by mouth 2 (two) times daily. Patient not taking: Reported on 07/05/2021 05/11/21   Tomi Bamberger, PA-C  ondansetron (ZOFRAN-ODT) 4 MG disintegrating tablet Take 1 tablet (4 mg total) by mouth every 8 (eight) hours as needed for nausea or vomiting. Patient not taking: Reported on 08/09/2022 09/15/21   Gustavus Bryant, FNP  phenazopyridine (PYRIDIUM) 200 MG tablet Take 1 tablet (200 mg total) by mouth 3 (three) times daily. Patient not taking: Reported on 07/05/2021 06/30/21   Theron Arista, PA-C  promethazine (PHENERGAN) 12.5 MG tablet Take 1 tablet (12.5 mg total) by mouth at bedtime. 04/13/19 12/23/19  Marylene Land, CNM      Allergies    Bee venom, Fire ant, and Amoxicillin    Review of Systems   Review of Systems  Gastrointestinal:  Positive for nausea and vomiting.  All other systems reviewed and are negative.   Physical Exam Updated Vital Signs BP (!) 110/95   Pulse 64   Temp 99 F (37.2 C) (Oral)   Resp 16   Ht 5' (1.524 m)   Wt 59 kg   LMP 07/25/2022   SpO2 100%   BMI 25.39 kg/m  Physical Exam Vitals and nursing note reviewed.  Cardiovascular:     Rate and Rhythm: Normal rate and regular rhythm.  Abdominal:     General: Abdomen is flat. There is no distension.     Tenderness: There is abdominal tenderness. There is no guarding or rebound.  Musculoskeletal:  General: No swelling. Normal range of motion.  Skin:    General: Skin is warm and dry.     Capillary Refill: Capillary refill takes 2 to 3 seconds.     ED Results / Procedures / Treatments   Labs (all labs ordered are listed, but only abnormal results are displayed) Labs Reviewed  URINALYSIS, ROUTINE W REFLEX MICROSCOPIC - Abnormal; Notable for the following components:      Result Value   Bilirubin Urine MODERATE (*)    Ketones, ur 80 (*)    Protein, ur 100 (*)    All other components within normal limits  PREGNANCY, URINE - Abnormal; Notable for the following components:   Preg  Test, Ur POSITIVE (*)    All other components within normal limits  URINALYSIS, MICROSCOPIC (REFLEX) - Abnormal; Notable for the following components:   Bacteria, UA MANY (*)    All other components within normal limits  COMPREHENSIVE METABOLIC PANEL - Abnormal; Notable for the following components:   Sodium 133 (*)    Potassium 3.0 (*)    All other components within normal limits  CBC WITH DIFFERENTIAL/PLATELET - Abnormal; Notable for the following components:   RBC 3.75 (*)    HCT 34.6 (*)    All other components within normal limits  CBC WITH DIFFERENTIAL/PLATELET    EKG None  Radiology No results found.  Procedures Procedures   Medications Ordered in ED Medications  potassium chloride SA (KLOR-CON M) CR tablet 40 mEq (40 mEq Oral Patient Refused/Not Given 09/11/22 2122)  lactated ringers bolus 1,000 mL (0 mLs Intravenous Stopped 09/11/22 1920)  ondansetron (ZOFRAN) injection 4 mg (4 mg Intravenous Given 09/11/22 1821)  ondansetron (ZOFRAN) injection 4 mg (4 mg Intravenous Given 09/11/22 1953)    ED Course/ Medical Decision Making/ A&P                           Medical Decision Making Amount and/or Complexity of Data Reviewed Labs: ordered.  Risk Prescription drug management.   This patient presents to the ED for concern of vomiting.  Differential diagnosis includes Seizure, gastroenteritis, cyclic vomiting syndrome, cannabinoid hyperemesis syndrome   Lab Tests:  I Ordered, and personally interpreted labs.  The pertinent results include: CBC unremarkable, CMP with evidence of dehydration with sodium of 133 potassium 3.0.  Urinalysis reassuring, pregnancy positive   Medicines ordered and prescription drug management:  I ordered medication including fluids, Zofran for dehydration and nausea Reevaluation of the patient after these medicines showed that the patient improved I have reviewed the patients home medicines and have made adjustments as needed   Problem List  / ED Course:  Patient presents to the emergency department with complaint of vomiting during pregnancy. She reports she is currently [redacted] weeks pregnant but is unsure of GP but states 2 children are living. Has previously had difficult with vomiting in prior pregnancies. Has not been able to tolerate food or drink well at home so IV fluids initiated given presentation. Labs confirmed evidence of dehydration with hyponatremia and hypokalemia. Consulted with OB/GYN who advised that zofran is safe in 1st trimester if patient is getting symptomatic relief from medication. Shared decision making to initiate IV Zofran to control vomiting. After 2 doses of IV zofran, nausea improved but patient reported still experiencing some abdominal discomfort. Patient feels that she is able to discharge home if she had access to zofran there. Prescription for zofran sent to patient's pharmacy and I advised  her to follow up with OBGYN to reduce the risk of complications arising in pregnancy from significant vomiting. Patient agreeable with treatment plan and verbalized understanding all return precautions. Patient did not take potassium tablet as she felt it would make her nausea worse.  Final Clinical Impression(s) / ED Diagnoses Final diagnoses:  Nausea and vomiting in pregnancy  Hypokalemia    Rx / DC Orders ED Discharge Orders          Ordered    ondansetron (ZOFRAN-ODT) 4 MG disintegrating tablet  Every 8 hours PRN        09/11/22 2115              Smitty Knudsen, PA-C 09/12/22 0016    Tegeler, Canary Brim, MD 09/15/22 [phone]

## 2022-09-11 NOTE — Discharge Instructions (Addendum)
You are seen in the emergency department for vomiting and for semester pregnancy.  The Zofran appears to help slightly get your nausea a little more under control.  I discussed your situation with the OB/GYN and he advised that Zofran is safe to take in the first trimester pregnancy was sent a prescription of Zofran to her pharmacy.  If you have concerns for dehydration please return the emergency department for further evaluation and possible treatment.  Please plan to follow-up with your OB/GYN for further evaluation as well.

## 2022-11-18 ENCOUNTER — Ambulatory Visit
Admission: EM | Admit: 2022-11-18 | Discharge: 2022-11-18 | Disposition: A | Discharge status: Home / Self Care | Payer: Medicaid Other | Attending: Internal Medicine | Admitting: Internal Medicine

## 2022-11-18 DIAGNOSIS — Z113 Encounter for screening for infections with a predominantly sexual mode of transmission: Secondary | ICD-10-CM | POA: Diagnosis present

## 2022-11-18 DIAGNOSIS — N898 Other specified noninflammatory disorders of vagina: Secondary | ICD-10-CM

## 2022-11-18 MED ORDER — VALACYCLOVIR HCL 1 G PO TABS: 1000.0000 mg | ORAL_TABLET | Freq: Two times a day (BID) | ORAL | 0 refills | Status: AC

## 2022-11-18 NOTE — Discharge Instructions (Addendum)
STD testing is pending.  Will call if it is abnormal.  I have prescribed you Valtrex which should help alleviate any type of herpes related virus in your vaginal area.  Refrain from sexual activity until test results and treatment are complete.  Follow-up with gynecology for further evaluation and management.

## 2022-11-18 NOTE — ED Triage Notes (Signed)
Pt reports bumps in the vagina, vaginal discomfort, white vaginal discharge x 1 day. Reports "MY boyfriend has a cold sore and we had sex yesterday".
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
## 2022-11-21 LAB — CERVICOVAGINAL ANCILLARY ONLY
Bacterial Vaginitis (gardnerella): NEGATIVE
Candida Glabrata: NEGATIVE
Candida Vaginitis: NEGATIVE
Chlamydia: NEGATIVE
Comment: NEGATIVE
Comment: NEGATIVE
Comment: NEGATIVE
Comment: NEGATIVE
Comment: NEGATIVE
Comment: NORMAL
Neisseria Gonorrhea: NEGATIVE
Trichomonas: NEGATIVE

## 2022-11-22 LAB — HSV CULTURE AND TYPING

## 2022-11-28 ENCOUNTER — Telehealth (HOSPITAL_COMMUNITY): Payer: Self-pay | Admitting: Emergency Medicine

## 2022-11-28 NOTE — Telephone Encounter (Signed)
Patient had f/u questions about recent herpes result, all questions answered

## 2022-12-20 ENCOUNTER — Ambulatory Visit
Admission: EM | Admit: 2022-12-20 | Discharge: 2022-12-20 | Disposition: A | Discharge status: Home / Self Care | Payer: Medicaid Other | Attending: Family Medicine | Admitting: Family Medicine

## 2022-12-20 ENCOUNTER — Encounter: Payer: Self-pay | Admitting: Emergency Medicine

## 2022-12-20 ENCOUNTER — Other Ambulatory Visit: Payer: Self-pay

## 2022-12-20 DIAGNOSIS — R0982 Postnasal drip: Secondary | ICD-10-CM

## 2022-12-20 DIAGNOSIS — J209 Acute bronchitis, unspecified: Secondary | ICD-10-CM

## 2022-12-20 MED ORDER — PREDNISONE 20 MG PO TABS: 20.0000 mg | ORAL_TABLET | Freq: Every day | ORAL | 0 refills | Status: AC

## 2022-12-20 MED ORDER — PROMETHAZINE-DM 6.25-15 MG/5ML PO SYRP: 5.0000 mL | ORAL_SOLUTION | Freq: Four times a day (QID) | ORAL | 0 refills | Status: DC | PRN

## 2022-12-20 MED ORDER — AZITHROMYCIN 250 MG PO TABS: ORAL_TABLET | ORAL | 0 refills | Status: AC

## 2022-12-20 NOTE — Discharge Instructions (Signed)
Start prednisone and continue your treatment for your UTI.  If symptoms continue to persist fill prescription for azithromycin and take as directed.  Do not recommend taking 2 antibiotics at the same time.  Hydrate well with fluids.  I have prescribed you Promethazine DM as needed for cough and congestion.

## 2022-12-20 NOTE — ED Triage Notes (Signed)
Pt sts nasal congestion and sore throat x 10 days with negative strep test; pt sts some hoarseness in voice and feels like chest is congested; pt sts seen last week and treated for UTI; pt sts respiratory sx are not improved; no distress noted at present

## 2022-12-20 NOTE — ED Provider Notes (Signed)
[name]    CSN: [id number] Arrival date & time: 12/20/22  1521      History   Chief Complaint Chief Complaint  Patient presents with   Nasal Congestion    HPI Andrea Carson is a 27 y.o. female.   HPI Patient presents for evaluation of nasal congestion, chest heaviness and shortness of breath since onset of upper respiratory symptoms 10 days ago.  She is also endorses remittent sore throat.  She has had a recent negative rapid strep test.  She has not had fever.  She endorses that while at work and with exertional activity she noticed the shortness of breath and chest heaviness worsens.  She is a smoker.  Has no history of asthma.  Continues to feel persistent postnasal drainage.  She is currently being treated with Keflex for UTI.  Past Medical History:  Diagnosis Date   BV (bacterial vaginosis)    Constipation    Eczema    Eczema    Environmental allergies    Gall stones    Heart murmur    pt reports as a child-but has resolved   Infection    UTI   Seasonal allergies    Vaginal delivery 05/31/2014    Patient Active Problem List   Diagnosis Date Noted   Mild preeclampsia 09/15/2020   Hx laparoscopic cholecystectomy 08/06/2020   History of 2019 novel coronavirus disease (COVID-19) 08/06/2020    Past Surgical History:  Procedure Laterality Date   ADENOIDECTOMY     CHOLECYSTECTOMY N/A 07/31/2014   Procedure: LAPAROSCOPIC CHOLECYSTECTOMY WITH INTRAOPERATIVE CHOLANGIOGRAM;  Surgeon: Avel Peace, MD;  Location: [facility];  Service: General;  Laterality: N/A;   THERAPEUTIC ABORTION     x3   TONSILLECTOMY      OB History     Gravida  9   Para  2   Term  2   Preterm  0   AB  5   Living  2      SAB  0   IAB  5   Ectopic  0   Multiple  0   Live Births  2            Home Medications    Prior to Admission medications   Medication Sig Start Date End Date Taking? Authorizing Provider  azithromycin (ZITHROMAX) 250 MG tablet  Take 2 tabs PO x 1 dose, then 1 tab PO QD x 4 days 12/26/22 12/30/22 Yes Bing Neighbors, NP  predniSONE (DELTASONE) 20 MG tablet Take 1 tablet (20 mg total) by mouth daily with breakfast for 5 days. 12/20/22 12/25/22 Yes Bing Neighbors, NP  promethazine-dextromethorphan (PROMETHAZINE-DM) 6.25-15 MG/5ML syrup Take 5 mLs by mouth 4 (four) times daily as needed for cough. 12/20/22  Yes Bing Neighbors, NP  acetaminophen (TYLENOL) 325 MG tablet Take 2 tablets (650 mg total) by mouth every 4 (four) hours as needed (for pain scale < 4). Patient not taking: Reported on 11/17/2020 09/16/20   Gita Kudo, MD  Doxylamine-Pyridoxine 10-10 MG TBEC Take 2 tablets by mouth at bedtime. Patient not taking: Reported on 07/05/2021 03/31/21   Gerrit Heck, CNM  metoCLOPramide (REGLAN) 10 MG tablet Take 1 tablet (10 mg total) by mouth every 6 (six) hours. Patient not taking: Reported on 07/05/2021 03/31/21   Gerrit Heck, CNM  metroNIDAZOLE (FLAGYL) 500 MG tablet Take 1 tablet (500 mg total) by mouth 2 (two) times daily. Patient not taking: Reported on 07/05/2021 06/22/21   Erma Pinto  F, PA-C  ondansetron (ZOFRAN-ODT) 4 MG disintegrating tablet Take 1 tablet (4 mg total) by mouth every 8 (eight) hours as needed for nausea or vomiting. Patient not taking: Reported on 08/09/2022 09/15/21   Gustavus Bryant, FNP  ondansetron (ZOFRAN-ODT) 4 MG disintegrating tablet Take 1 tablet (4 mg total) by mouth every 8 (eight) hours as needed for nausea or vomiting. 09/11/22   Smitty Knudsen, PA-C  promethazine (PHENERGAN) 12.5 MG tablet Take 1 tablet (12.5 mg total) by mouth at bedtime. 04/13/19 12/23/19  Marylene Land, CNM    Family History Family History  Problem Relation Age of Onset   Hypertension Maternal Grandmother    Cancer Maternal Grandmother    Hypertension Mother    Cancer Maternal Aunt    Anxiety disorder Maternal Uncle    Diabetes Paternal Grandmother     Social History Social History    Tobacco Use   Smoking status: Never   Smokeless tobacco: Never  Vaping Use   Vaping status: Former  Substance Use Topics   Alcohol use: Not Currently   Drug use: Yes    Types: Marijuana     Allergies   Bee venom, Fire ant, and Amoxicillin   Review of Systems Review of Systems Pertinent negatives listed in HPI  Physical Exam Triage Vital Signs ED Triage Vitals [12/20/22 1540]  Encounter Vitals Group     BP 107/65     Systolic BP Percentile      Diastolic BP Percentile      Pulse Rate 86     Resp 18     Temp 98.6 F (37 C)     Temp Source Oral     SpO2 96 %     Weight      Height      Head Circumference      Peak Flow      Pain Score 3     Pain Loc      Pain Education      Exclude from Growth Chart    No data found.  Updated Vital Signs BP 107/65 (BP Location: Left Arm)   Pulse 86   Temp 98.6 F (37 C) (Oral)   Resp 18   SpO2 96%   Visual Acuity Right Eye Distance:   Left Eye Distance:   Bilateral Distance:    Right Eye Near:   Left Eye Near:    Bilateral Near:     Physical Exam Vitals reviewed.  Constitutional:      Appearance: Normal appearance.  HENT:     Head: Normocephalic and atraumatic.  Eyes:     Extraocular Movements: Extraocular movements intact.     Conjunctiva/sclera: Conjunctivae normal.     Pupils: Pupils are equal, round, and reactive to light.  Cardiovascular:     Rate and Rhythm: Normal rate and regular rhythm.  Pulmonary:     Effort: Pulmonary effort is normal.     Breath sounds: Normal breath sounds. No wheezing.  Skin:    General: Skin is warm and dry.  Neurological:     General: No focal deficit present.     Mental Status: She is alert.    UC Treatments / Results  Labs (all labs ordered are listed, but only abnormal results are displayed) Labs Reviewed - No data to display  EKG   Radiology No results found.  Procedures Procedures (including critical care time)  Medications Ordered in UC Medications  - No data to display  Initial Impression /  Assessment and Plan / UC Course  I have reviewed the triage vital signs and the nursing notes.  Pertinent labs & imaging results that were available during my care of the patient were reviewed by me and considered in my medical decision making (see chart for details).    Acute bronchitis start prednisone 20 mg once daily for total 5 days for acute cough Promethazine DM 5 mL every 4 hours as needed for cough.  Did prescribe azithromycin as patient has had some sinus symptoms for little over 10 days however advised do not take unless symptoms are still present after completion of Keflex.  Recommended not taking 2 antibiotics simultaneously.  Patient verbalized understanding and agreement with plan. Final diagnoses:  Acute bronchitis, unspecified organism  Post-nasal drainage     Discharge Instructions      Start prednisone and continue your treatment for your UTI.  If symptoms continue to persist fill prescription for azithromycin and take as directed.  Do not recommend taking 2 antibiotics at the same time.  Hydrate well with fluids.  I have prescribed you Promethazine DM as needed for cough and congestion.     ED Prescriptions     Medication Sig Dispense Auth. Provider   predniSONE (DELTASONE) 20 MG tablet Take 1 tablet (20 mg total) by mouth daily with breakfast for 5 days. 5 tablet Bing Neighbors, NP   azithromycin (ZITHROMAX) 250 MG tablet Take 2 tabs PO x 1 dose, then 1 tab PO QD x 4 days 6 tablet Bing Neighbors, NP   promethazine-dextromethorphan (PROMETHAZINE-DM) 6.25-15 MG/5ML syrup Take 5 mLs by mouth 4 (four) times daily as needed for cough. 180 mL Bing Neighbors, NP      PDMP not reviewed this encounter.   Bing Neighbors, NP 12/20/22 1755

## 2022-12-20 NOTE — ED Notes (Signed)
Attempted to call pt back and pt not in lobby

## 2022-12-24 ENCOUNTER — Other Ambulatory Visit: Payer: Self-pay

## 2022-12-24 ENCOUNTER — Emergency Department (HOSPITAL_BASED_OUTPATIENT_CLINIC_OR_DEPARTMENT_OTHER): Payer: Medicaid Other

## 2022-12-24 ENCOUNTER — Encounter (HOSPITAL_BASED_OUTPATIENT_CLINIC_OR_DEPARTMENT_OTHER): Payer: Self-pay | Admitting: Emergency Medicine

## 2022-12-24 DIAGNOSIS — O99711 Diseases of the skin and subcutaneous tissue complicating pregnancy, first trimester: Secondary | ICD-10-CM | POA: Insufficient documentation

## 2022-12-24 DIAGNOSIS — L509 Urticaria, unspecified: Secondary | ICD-10-CM | POA: Insufficient documentation

## 2022-12-24 DIAGNOSIS — B349 Viral infection, unspecified: Secondary | ICD-10-CM | POA: Diagnosis not present

## 2022-12-24 LAB — BASIC METABOLIC PANEL WITH GFR
Anion gap: 9 (ref 5–15)
BUN: 9 mg/dL (ref 6–20)
CO2: 23 mmol/L (ref 22–32)
Calcium: 9.2 mg/dL (ref 8.9–10.3)
Chloride: 102 mmol/L (ref 98–111)
Creatinine, Ser: 0.71 mg/dL (ref 0.44–1.00)
GFR, Estimated: >60 mL/min
Glucose, Bld: 123 mg/dL — ABNORMAL HIGH (ref 70–99)
Potassium: 3.5 mmol/L (ref 3.5–5.1)
Sodium: 134 mmol/L — ABNORMAL LOW (ref 135–145)

## 2022-12-24 LAB — CBC
HCT: 41.1 % (ref 36.0–46.0)
Hemoglobin: 14.1 g/dL (ref 12.0–15.0)
MCH: 32.8 pg (ref 26.0–34.0)
MCHC: 34.3 g/dL (ref 30.0–36.0)
MCV: 95.6 fL (ref 80.0–100.0)
Platelets: 208 K/uL (ref 150–400)
RBC: 4.3 MIL/uL (ref 3.87–5.11)
RDW: 12.3 % (ref 11.5–15.5)
WBC: 7.7 K/uL (ref 4.0–10.5)
nRBC: 0 % (ref 0.0–0.2)

## 2022-12-24 LAB — PREGNANCY, URINE: Preg Test, Ur: POSITIVE — AB

## 2022-12-24 LAB — TROPONIN I (HIGH SENSITIVITY): Troponin I (High Sensitivity): <2 ng/L

## 2022-12-24 NOTE — ED Triage Notes (Signed)
Multiple complaints:  -Neck stiffness (able to touch chin to chest) for 4 days -Rash on chest after taking keflex -continued low back pain after stopping abx for UTI early for rash -chest heaviness that feel similar to recent bronchitis for weeks  Denies fever

## 2022-12-25 ENCOUNTER — Emergency Department (HOSPITAL_BASED_OUTPATIENT_CLINIC_OR_DEPARTMENT_OTHER)
Admission: EM | Admit: 2022-12-25 | Discharge: 2022-12-25 | Disposition: A | Discharge status: Home / Self Care | Payer: Medicaid Other | Source: Home / Self Care | Attending: Emergency Medicine | Admitting: Emergency Medicine

## 2022-12-25 DIAGNOSIS — Z3A01 Less than 8 weeks gestation of pregnancy: Secondary | ICD-10-CM

## 2022-12-25 DIAGNOSIS — B349 Viral infection, unspecified: Secondary | ICD-10-CM

## 2022-12-25 DIAGNOSIS — L509 Urticaria, unspecified: Secondary | ICD-10-CM

## 2022-12-25 LAB — URINALYSIS, ROUTINE W REFLEX MICROSCOPIC
Bilirubin Urine: NEGATIVE
Glucose, UA: NEGATIVE mg/dL
Hgb urine dipstick: NEGATIVE
Ketones, ur: NEGATIVE mg/dL
Nitrite: NEGATIVE
Protein, ur: NEGATIVE mg/dL
Specific Gravity, Urine: 1.02 (ref 1.005–1.030)
pH: 7 (ref 5.0–8.0)

## 2022-12-25 LAB — URINALYSIS, MICROSCOPIC (REFLEX): Bacteria, UA: NONE SEEN

## 2022-12-25 NOTE — ED Provider Notes (Signed)
[facility]  Provider Note  CSN: [id number] Arrival date & time: 12/24/22 2032  History Chief Complaint  Patient presents with   Illness    Andrea Carson is a 27 y.o. female with a history of health anxiety is here for a variety of different complaints. She initially went to an UC on 7/11 for some pelvic complaints, labs including UA were normal but a few days later culture came back positive for <50k colonies of strep agalactica and she was called in Keflex. A few days after starting that she developed a rash on her chest so she stopped it. She was also seen at a different UC for bronchitis and given Rx for prednisone which she did not take. She is concerned because she began having some muscle aches and when she googled her symptoms became concerned she might have HIV or cancer.   Home Medications Prior to Admission medications   Medication Sig Start Date End Date Taking? Authorizing Provider  acetaminophen (TYLENOL) 325 MG tablet Take 2 tablets (650 mg total) by mouth every 4 (four) hours as needed (for pain scale < 4). Patient not taking: Reported on 11/17/2020 09/16/20   Gita Kudo, MD  azithromycin [facility]) 250 MG tablet Take 2 tabs PO x 1 dose, then 1 tab PO QD x 4 days 12/26/22 12/30/22  Bing Neighbors, NP  Doxylamine-Pyridoxine 10-10 MG TBEC Take 2 tablets by mouth at bedtime. Patient not taking: Reported on 07/05/2021 03/31/21   Gerrit Heck, CNM  metoCLOPramide (REGLAN) 10 MG tablet Take 1 tablet (10 mg total) by mouth every 6 (six) hours. Patient not taking: Reported on 07/05/2021 03/31/21   Gerrit Heck, CNM  metroNIDAZOLE (FLAGYL) 500 MG tablet Take 1 tablet (500 mg total) by mouth 2 (two) times daily. Patient not taking: Reported on 07/05/2021 06/22/21   Tomi Bamberger, PA-C  ondansetron (ZOFRAN-ODT) 4 MG disintegrating tablet Take 1 tablet (4 mg total) by mouth every 8 (eight) hours as needed for nausea or  vomiting. Patient not taking: Reported on 08/09/2022 09/15/21   Gustavus Bryant, FNP  ondansetron (ZOFRAN-ODT) 4 MG disintegrating tablet Take 1 tablet (4 mg total) by mouth every 8 (eight) hours as needed for nausea or vomiting. 09/11/22   Smitty Knudsen, PA-C  predniSONE (DELTASONE) 20 MG tablet Take 1 tablet (20 mg total) by mouth daily with breakfast for 5 days. 12/20/22 12/25/22  Bing Neighbors, NP  promethazine-dextromethorphan (PROMETHAZINE-DM) 6.25-15 MG/5ML syrup Take 5 mLs by mouth 4 (four) times daily as needed for cough. 12/20/22   Bing Neighbors, NP  promethazine (PHENERGAN) 12.5 MG tablet Take 1 tablet (12.5 mg total) by mouth at bedtime. 04/13/19 12/23/19  Marylene Land, CNM     Allergies    Bee venom, Fire ant, and Amoxicillin   Review of Systems   Review of Systems Please see HPI for pertinent positives and negatives  Physical Exam BP 112/75   Pulse 84   Temp 99.1 F (37.3 C) (Oral)   Resp 16   Wt 65.8 kg   LMP 11/27/2022   SpO2 100%   BMI 28.32 kg/m   Physical Exam Vitals and nursing note reviewed.  Constitutional:      Appearance: Normal appearance.  HENT:     Head: Normocephalic and atraumatic.     Nose: Nose normal.     Mouth/Throat:     Mouth: Mucous membranes are moist.  Eyes:     Extraocular  Movements: Extraocular movements intact.     Conjunctiva/sclera: Conjunctivae normal.  Cardiovascular:     Rate and Rhythm: Normal rate.  Pulmonary:     Effort: Pulmonary effort is normal.     Breath sounds: Normal breath sounds. No wheezing or rhonchi.  Abdominal:     General: Abdomen is flat.     Palpations: Abdomen is soft.     Tenderness: There is no abdominal tenderness.  Musculoskeletal:        General: No swelling. Normal range of motion.     Cervical back: Neck supple.  Skin:    General: Skin is warm and dry.     Findings: Rash (small area of urticarial rash on mid chest) present.  Neurological:     General: No focal deficit  present.     Mental Status: She is alert.     Cranial Nerves: No cranial nerve deficit.     Motor: No weakness.  Psychiatric:        Mood and Affect: Mood normal.     ED Results / Procedures / Treatments   EKG None  Procedures Procedures  Medications Ordered in the ED Medications - No data to display  Initial Impression and Plan  Patient here with a variety of complaints, mostly nonspecific and more related to her anxiety. She had labs done in triage showing normal CBC, BMP, Trop and UA. Her HCG did come back positive, LMP was ~4 weeks ago. She has been pregnant before. I personally viewed the images from radiology studies and agree with radiologist interpretation: CXR is normal. Patient reassured no signs of UTI or other life threatening process. Advised topical antihistamines and/or steroid cream for her rash. Encouraged to make an appointment with Ob/Gyn when she returns home to [place]. RTED for any other concerns.   ED Course       MDM Rules/Calculators/A&P Medical Decision Making Problems Addressed: Less than [redacted] weeks gestation of pregnancy: acute illness or injury Urticaria: acute illness or injury Viral syndrome: acute illness or injury  Amount and/or Complexity of Data Reviewed Labs: ordered. Decision-making details documented in ED Course. Radiology: ordered and independent interpretation performed. Decision-making details documented in ED Course. ECG/medicine tests: ordered and independent interpretation performed. Decision-making details documented in ED Course.  Risk OTC drugs.     Final Clinical Impression(s) / ED Diagnoses Final diagnoses:  Viral syndrome  Urticaria  Less than [redacted] weeks gestation of pregnancy    Rx / DC Orders ED Discharge Orders     None        Pollyann Savoy, MD 12/25/22 0145

## 2022-12-25 NOTE — ED Notes (Signed)
MD at bedside. 

## 2022-12-25 NOTE — ED Notes (Signed)
Patient verbalizes understanding of discharge instructions. Opportunity for questioning and answers were provided. Armband removed by staff, pt discharged from ED. Ambulated out to lobby  

## 2022-12-25 NOTE — ED Notes (Signed)
Patient resting quietly in stretcher, respirations even, unlabored, no acute distress noted. Denies needs at this time.  

## 2023-01-01 ENCOUNTER — Ambulatory Visit
Admission: EM | Admit: 2023-01-01 | Discharge: 2023-01-01 | Disposition: A | Discharge status: Home / Self Care | Payer: Medicaid Other | Attending: Physician Assistant | Admitting: Physician Assistant

## 2023-01-01 DIAGNOSIS — J029 Acute pharyngitis, unspecified: Secondary | ICD-10-CM | POA: Diagnosis not present

## 2023-01-01 MED ORDER — AZITHROMYCIN 250 MG PO TABS: 250.0000 mg | ORAL_TABLET | Freq: Every day | ORAL | 0 refills | Status: DC

## 2023-01-01 NOTE — ED Provider Notes (Signed)
[name]    CSN: [id number] Arrival date & time: 01/01/23  1437      History   Chief Complaint No chief complaint on file.   HPI Andrea Carson is a 27 y.o. female.   Patient here today for evaluation of continued sore throat after being seen for bronchitis about 2 weeks ago.  She states that her cough has improved somewhat with promethazine cough syrup however her sore throat does not seem to be improving.  She does have some ear fullness and popping as well.  She denies any fever.  She was prescribed a cephalosporin for UTI as she is pregnant however had rash develop so she discontinued.  She does not report any UTI symptoms.  She does have known allergy to penicillins.  The history is provided by the patient.    Past Medical History:  Diagnosis Date   BV (bacterial vaginosis)    Constipation    Eczema    Eczema    Environmental allergies    Gall stones    Heart murmur    pt reports as a child-but has resolved   Infection    UTI   Seasonal allergies    Vaginal delivery 05/31/2014    Patient Active Problem List   Diagnosis Date Noted   Mild preeclampsia 09/15/2020   Hx laparoscopic cholecystectomy 08/06/2020   History of 2019 novel coronavirus disease (COVID-19) 08/06/2020    Past Surgical History:  Procedure Laterality Date   ADENOIDECTOMY     CHOLECYSTECTOMY N/A 07/31/2014   Procedure: LAPAROSCOPIC CHOLECYSTECTOMY WITH INTRAOPERATIVE CHOLANGIOGRAM;  Surgeon: Avel Peace, MD;  Location: [facility];  Service: General;  Laterality: N/A;   THERAPEUTIC ABORTION     x3   TONSILLECTOMY      OB History     Gravida  10   Para  2   Term  2   Preterm  0   AB  5   Living  2      SAB  0   IAB  5   Ectopic  0   Multiple  0   Live Births  2            Home Medications    Prior to Admission medications   Medication Sig Start Date End Date Taking? Authorizing Provider  azithromycin (ZITHROMAX) 250 MG tablet Take 1 tablet (250 mg  total) by mouth daily. Take first 2 tablets together, then 1 every day until finished. 01/01/23  Yes Tomi Bamberger, PA-C  ondansetron (ZOFRAN-ODT) 4 MG disintegrating tablet Take 1 tablet (4 mg total) by mouth every 8 (eight) hours as needed for nausea or vomiting. 09/11/22   Smitty Knudsen, PA-C  promethazine-dextromethorphan (PROMETHAZINE-DM) 6.25-15 MG/5ML syrup Take 5 mLs by mouth 4 (four) times daily as needed for cough. 12/20/22   Bing Neighbors, NP  promethazine (PHENERGAN) 12.5 MG tablet Take 1 tablet (12.5 mg total) by mouth at bedtime. 04/13/19 12/23/19  Marylene Land, CNM    Family History Family History  Problem Relation Age of Onset   Hypertension Maternal Grandmother    Cancer Maternal Grandmother    Hypertension Mother    Cancer Maternal Aunt    Anxiety disorder Maternal Uncle    Diabetes Paternal Grandmother     Social History Social History   Tobacco Use   Smoking status: Never   Smokeless tobacco: Never  Vaping Use   Vaping status: Former  Substance Use Topics   Alcohol use: Not Currently  Drug use: Yes    Types: Marijuana     Allergies   Bee venom, Fire ant, and Amoxicillin   Review of Systems Review of Systems  Constitutional:  Negative for chills and fever.  HENT:  Positive for congestion and sore throat.   Eyes:  Negative for discharge and redness.  Respiratory:  Positive for cough. Negative for shortness of breath.   Skin:  Positive for rash.     Physical Exam Triage Vital Signs ED Triage Vitals  Encounter Vitals Group     BP 01/01/23 1539 117/70     Systolic BP Percentile --      Diastolic BP Percentile --      Pulse Rate 01/01/23 1539 86     Resp 01/01/23 1539 12     Temp 01/01/23 1539 99 F (37.2 C)     Temp Source 01/01/23 1539 Oral     SpO2 01/01/23 1539 98 %     Weight --      Height --      Head Circumference --      Peak Flow --      Pain Score 01/01/23 1545 7     Pain Loc --      Pain Education --       Exclude from Growth Chart --    No data found.  Updated Vital Signs BP 117/70 (BP Location: Left Arm)   Pulse 86   Temp 99 F (37.2 C) (Oral)   Resp 12   LMP 11/27/2022 (Within Months)   SpO2 98%     Physical Exam Vitals and nursing note reviewed.  Constitutional:      General: She is not in acute distress.    Appearance: Normal appearance. She is not ill-appearing.  HENT:     Head: Normocephalic and atraumatic.     Nose: Congestion present.     Mouth/Throat:     Mouth: Mucous membranes are moist.     Pharynx: Oropharyngeal exudate present.     Comments: PND noted Eyes:     Conjunctiva/sclera: Conjunctivae normal.  Cardiovascular:     Rate and Rhythm: Normal rate and regular rhythm.  Pulmonary:     Effort: Pulmonary effort is normal. No respiratory distress.     Breath sounds: Normal breath sounds. No wheezing, rhonchi or rales.  Skin:    General: Skin is warm.     Findings: Rash (small papular rash noted scattered to trunk) present.  Neurological:     Mental Status: She is alert.  Psychiatric:        Mood and Affect: Mood normal.        Behavior: Behavior normal.        Thought Content: Thought content normal.      UC Treatments / Results  Labs (all labs ordered are listed, but only abnormal results are displayed) Labs Reviewed - No data to display  EKG   Radiology No results found.  Procedures Procedures (including critical care time)  Medications Ordered in UC Medications - No data to display  Initial Impression / Assessment and Plan / UC Course  I have reviewed the triage vital signs and the nursing notes.  Pertinent labs & imaging results that were available during my care of the patient were reviewed by me and considered in my medical decision making (see chart for details).    Will trial zpak given continued symptoms. Recommended follow up if no gradual improvement or with any further concerns. Advised follow up  with OB as well in the near  future.   Final Clinical Impressions(s) / UC Diagnoses   Final diagnoses:  Acute pharyngitis, unspecified etiology     Discharge Instructions       Be sure to eat before taking antibiotics.   Follow up with any further concerns.      ED Prescriptions     Medication Sig Dispense Auth. Provider   azithromycin (ZITHROMAX) 250 MG tablet Take 1 tablet (250 mg total) by mouth daily. Take first 2 tablets together, then 1 every day until finished. 6 tablet Tomi Bamberger, PA-C      PDMP not reviewed this encounter.   Tomi Bamberger, PA-C 01/01/23 [phone]

## 2023-01-01 NOTE — ED Triage Notes (Signed)
Pt c/o sore throat and ear fullness x 2 weeks, pt was seen last week for bronchitis, ears keep popping.  Painful swallowing.

## 2023-01-01 NOTE — Discharge Instructions (Signed)
  Be sure to eat before taking antibiotics.   Follow up with any further concerns.

## 2023-03-20 ENCOUNTER — Other Ambulatory Visit: Payer: Self-pay

## 2023-03-20 ENCOUNTER — Encounter: Payer: Self-pay | Admitting: Obstetrics and Gynecology

## 2023-03-20 ENCOUNTER — Emergency Department (HOSPITAL_BASED_OUTPATIENT_CLINIC_OR_DEPARTMENT_OTHER): Payer: Medicaid Other

## 2023-03-20 ENCOUNTER — Emergency Department (HOSPITAL_BASED_OUTPATIENT_CLINIC_OR_DEPARTMENT_OTHER)
Admission: EM | Admit: 2023-03-20 | Discharge: 2023-03-20 | Disposition: A | Discharge status: Home / Self Care | Payer: Medicaid Other | Attending: Emergency Medicine | Admitting: Emergency Medicine

## 2023-03-20 ENCOUNTER — Ambulatory Visit (INDEPENDENT_AMBULATORY_CARE_PROVIDER_SITE_OTHER): Payer: Medicaid Other | Admitting: Obstetrics and Gynecology

## 2023-03-20 ENCOUNTER — Encounter (HOSPITAL_BASED_OUTPATIENT_CLINIC_OR_DEPARTMENT_OTHER): Payer: Self-pay | Admitting: Emergency Medicine

## 2023-03-20 VITALS — BP 116/78 | HR 89 | Ht 60.0 in | Wt 138.0 lb

## 2023-03-20 DIAGNOSIS — M791 Myalgia, unspecified site: Secondary | ICD-10-CM | POA: Insufficient documentation

## 2023-03-20 DIAGNOSIS — R102 Pelvic and perineal pain unspecified side: Secondary | ICD-10-CM | POA: Insufficient documentation

## 2023-03-20 DIAGNOSIS — R0789 Other chest pain: Secondary | ICD-10-CM | POA: Diagnosis present

## 2023-03-20 NOTE — ED Triage Notes (Signed)
Gl body aches on and off , was seen for this twice , Dx sinusitis . Completed her zithromax .   Pt expresses frustration due to multiple treatments and still  not feeling well

## 2023-03-20 NOTE — ED Provider Notes (Signed)
[place] [facility] Provider Note   CSN: [id number] Arrival date & time: 03/20/23  1629     History  Chief Complaint  Patient presents with   Generalized Body Aches    Andrea Carson is a 27 y.o. female with no significant past medical history presents with concern for generalized bodyaches that have been ongoing for the past month.  States the body aches come and go and are mostly in her upper body.  She has been evaluated for this by her primary last week, and says everything was normal.  Nothing is worsened today.  Reports that she had a cough a couple weeks ago that resolved with antibiotics from her primary care.  Also reports feeling " something" in her upper chest for the past couple days.  No chest pain, no shortness of breath, no cough.    Denies cough, fever or chills, dizziness.  HPI     Home Medications Prior to Admission medications   Medication Sig Start Date End Date Taking? Authorizing Provider  promethazine (PHENERGAN) 12.5 MG tablet Take 1 tablet (12.5 mg total) by mouth at bedtime. 04/13/19 12/23/19  Marylene Land, CNM      Allergies    Bee venom, Fire ant, and Amoxicillin    Review of Systems   Review of Systems  Constitutional:  Negative for fever.  Cardiovascular:  Negative for chest pain.    Physical Exam Updated Vital Signs BP 112/70   Pulse 76   Temp 99 F (37.2 C)   Resp 16   Wt 62.6 kg   LMP 03/13/2023 (Within Months) Comment: unsure dating  SpO2 96%   BMI 26.95 kg/m  Physical Exam Vitals and nursing note reviewed.  Constitutional:      General: She is not in acute distress.    Appearance: She is well-developed.     Comments: Well-appearing, moving around comfortably in the chair.  No acute distress.  HENT:     Head: Normocephalic and atraumatic.  Eyes:     Conjunctiva/sclera: Conjunctivae normal.  Cardiovascular:     Rate and Rhythm: Normal rate and regular rhythm.     Heart  sounds: No murmur heard.    Comments: Radial pulses 2+ bilaterally Pulmonary:     Effort: Pulmonary effort is normal. No respiratory distress.     Breath sounds: Normal breath sounds.  Abdominal:     Palpations: Abdomen is soft.     Tenderness: There is no abdominal tenderness.  Musculoskeletal:        General: No swelling.     Cervical back: Neck supple.     Comments: Tender to palpation of the chest wall.  Skin:    General: Skin is warm and dry.     Capillary Refill: Capillary refill takes less than 2 seconds.  Neurological:     Mental Status: She is alert.  Psychiatric:        Mood and Affect: Mood normal.     ED Results / Procedures / Treatments   Labs (all labs ordered are listed, but only abnormal results are displayed) Labs Reviewed - No data to display  EKG None  Radiology DG Chest 2 View  Result Date: 03/20/2023 CLINICAL DATA:  Sensation of something in the upper chest. Chest pain and pressure for 2 days. EXAM: CHEST - 2 VIEW COMPARISON:  12/24/2022 FINDINGS: The heart size and mediastinal contours are within normal limits. Both lungs are clear. The visualized skeletal structures are unremarkable. Surgical  clips in the right upper quadrant. IMPRESSION: No active cardiopulmonary disease. Electronically Signed   By: Burman Nieves M.D.   On: 03/20/2023 21:54    Procedures Procedures    Medications Ordered in ED Medications - No data to display  ED Course/ Medical Decision Making/ A&P                                 Medical Decision Making Amount and/or Complexity of Data Reviewed Radiology: ordered.   27 y.o. female with no significant past medical history presents to the ED for concern of a feeling of something in her upper chest for the past couple of days, generalized intermittent body aches of her upper body for the past month.  Differential diagnosis includes but is not limited to electrolyte abnormality, anemia, pneumonia, GERD, arrhythmia   ED  Course:  Patient overall well-appearing, no acute distress.  Vital signs stable.  She has had an extensive workup from her primary care 03/14/23 regarding this intermittent body ache concern including CBC, CMP, TSH, lipid panel, iron, syphilis, HIV, mono, flu, COVID, RSV.  All of these were unremarkable.  Since she has had no worsening or change in her feelings of bodyaches, do not feel she needs any repeat of these at this time.  Given her complaint of feeling something in her chest for the past couple of days, EKG and chest x-ray were obtained.  EKG with normal sinus rhythm, no signs of ischemia or arrhythmia  Upon reevaluation of the patient, still well-appearing.  Stable vital signs.  Patient brings up multiple other concerns such as feeling bloated, feels like she has a bump on her leg.  I examined her leg and I do not feel any discrete lump, no erythema or edema over the area as she is concerned about.  She reports she has some constipation, I recommended she use MiraLAX daily as this may make her feel less bloated.  I recommended she follow-up for these issues with her PCP.  I discussed that we are still waiting on the radiologist read for the chest x-ray, she would like to wait for the read. Chest x-ray read without any acute abnormalities.  Since she is tender to palpation over the chest wall, suspect this is chest wall pain secondary to the cough she has had.  I recommend doing ibuprofen as needed for pain.  Appropriate for discharge at this time.   Impression: Chest wall discomfort Intermittent generalized bodyaches for the past month  Disposition:  The patient was discharged home with instructions to follow-up with PCP regarding her symptoms for further monitoring and management.  Ibuporfen as needed for pain. Return precautions given.  Imaging Studies ordered: I ordered imaging studies including chest x-ray I independently visualized the imaging with scope of interpretation limited to  determining acute life threatening conditions related to emergency care. Imaging showed no acute abnormalities I agree with the radiologist interpretation   Cardiac Monitoring: / EKG: The patient was maintained on a cardiac monitor.  I personally viewed and interpreted the cardiac monitored which showed an underlying rhythm of: Normal sinus rhythm   External records from outside source obtained and reviewed including labs from PCP visit on 03/14/2023 including hCG, CBC, CMP, TSH, lipid panel, iron and TIBC, syphilis, HIV, COVID, flu, RSV, mononucleosis.  All of these were unremarkable.              Final Clinical Impression(s) /  ED Diagnoses Final diagnoses:  Chest discomfort    Rx / DC Orders ED Discharge Orders     None         Arabella Merles, PA-C 03/20/23 2202    Melene Plan, DO 03/20/23 2204

## 2023-03-20 NOTE — Discharge Instructions (Addendum)
Your chest x-ray shows no abnormalities today.  Your EKG (cardiac electrical activity) is reassuring.  Your x-ray results are as below: "FINDINGS: The heart size and mediastinal contours are within normal limits. Both lungs are clear. The visualized skeletal structures are unremarkable. Surgical clips in the right upper quadrant.   IMPRESSION: No active cardiopulmonary disease."   Please follow-up with your PCP regarding your symptoms for further management and monitoring.  You may use up to 800mg  ibuprofen every 8 hours as needed for pain.  Do not exceed 2.4g of ibuprofen per day.  Please eat a fiber filled diet of fruits and vegetables and drink plenty of water to help maintain regular bowel movements.  You may also use 1 scoop (17g) of MiraLAX daily to help with constipation.  This is an over-the-counter medication you can obtain at any drugstore.   Return to the ER for any shortness of breath, chest pain, dizziness, loss of consciousness, any other new or concerning symptoms.

## 2023-03-20 NOTE — Progress Notes (Signed)
Ms Andrea Carson presents with vague c/o pelvic pain since medicated TAB 2 months ago LMP 1 week ago. Sexual active, no contraception Pap smear UTD Had negative STD testing recently with PCP which was normal  UPT negative  PE AF VSS Chaperone present Lungs clear Abd soft + BS GU Nl EGBUS, uterus small, mobile, non tender, no masses  A/P Pelvic Pain Will check GYN U/S F/U per test results Pt reassured that her Gyn Exam was normal Pt considering IUD. Pt to schedule appt for insertion if desires

## 2023-03-20 NOTE — Progress Notes (Signed)
Pt states she had TAB 2 months ago. Pt has been having bloating and some pelvic pain.  Pt had recent testing that was negative. Pt states she took Doxycycline 2 weeks ago for sinus infection, stared feeling some symptoms afterward.  Will leave urine sample today.

## 2023-03-24 ENCOUNTER — Ambulatory Visit (HOSPITAL_BASED_OUTPATIENT_CLINIC_OR_DEPARTMENT_OTHER): Admission: RE | Admit: 2023-03-24 | Payer: Medicaid Other | Source: Ambulatory Visit

## 2023-03-29 ENCOUNTER — Other Ambulatory Visit: Payer: Self-pay | Admitting: Obstetrics

## 2023-03-29 DIAGNOSIS — N6320 Unspecified lump in the left breast, unspecified quadrant: Secondary | ICD-10-CM

## 2023-05-28 ENCOUNTER — Other Ambulatory Visit: Payer: Self-pay

## 2023-05-28 ENCOUNTER — Encounter (HOSPITAL_BASED_OUTPATIENT_CLINIC_OR_DEPARTMENT_OTHER): Payer: Self-pay

## 2023-05-28 ENCOUNTER — Emergency Department (HOSPITAL_BASED_OUTPATIENT_CLINIC_OR_DEPARTMENT_OTHER)
Admission: EM | Admit: 2023-05-28 | Discharge: 2023-05-29 | Disposition: A | Discharge status: Home / Self Care | Payer: Medicaid Other | Attending: Emergency Medicine | Admitting: Emergency Medicine

## 2023-05-28 DIAGNOSIS — S0181XA Laceration without foreign body of other part of head, initial encounter: Secondary | ICD-10-CM | POA: Diagnosis not present

## 2023-05-28 DIAGNOSIS — S0083XA Contusion of other part of head, initial encounter: Secondary | ICD-10-CM | POA: Insufficient documentation

## 2023-05-28 DIAGNOSIS — S0993XA Unspecified injury of face, initial encounter: Secondary | ICD-10-CM | POA: Diagnosis present

## 2023-05-28 MED ORDER — LIDOCAINE-EPINEPHRINE (PF) 2 %-1:200000 IJ SOLN
20.0000 mL | Freq: Once | INTRAMUSCULAR | Status: AC
  Administered 2023-05-29: 2 mL via INTRADERMAL
  Filled 2023-05-28: qty 20

## 2023-05-28 MED ORDER — LIDOCAINE-EPINEPHRINE-TETRACAINE (LET) TOPICAL GEL
3.0000 mL | Freq: Once | TOPICAL | Status: AC
  Administered 2023-05-29: 3 mL via TOPICAL
  Filled 2023-05-28: qty 3
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
## 2023-05-28 NOTE — ED Provider Notes (Incomplete)
[place] [facility] Provider Note  CSN: [id number] Arrival date & time: 05/28/23 2205  Chief Complaint(s) Laceration  HPI Andrea Carson is a 27 y.o. female {Add pertinent medical, surgical, social history, OB history to HPI:1}    Laceration   Past Medical History Past Medical History:  Diagnosis Date  . BV (bacterial vaginosis)   . Constipation   . Eczema   . Eczema   . Environmental allergies   . Gall stones   . Heart murmur    pt reports as a child-but has resolved  . Infection    UTI  . Seasonal allergies   . Vaginal delivery 05/31/2014   Patient Active Problem List   Diagnosis Date Noted  . Pelvic pain 03/20/2023  . Hx laparoscopic cholecystectomy 08/06/2020  . History of 2019 novel coronavirus disease (COVID-19) 08/06/2020   Home Medication(s) Prior to Admission medications   Medication Sig Start Date End Date Taking? Authorizing Provider  promethazine (PHENERGAN) 12.5 MG tablet Take 1 tablet (12.5 mg total) by mouth at bedtime. 04/13/19 12/23/19  Marylene Land, CNM                                                                                                                                    Allergies Bee venom, Fire ant, and Amoxicillin  Review of Systems Review of Systems As noted in HPI  Physical Exam Vital Signs  I have reviewed the triage vital signs BP 119/77 (BP Location: Left Arm)   Pulse 87   Temp 98.4 F (36.9 C) (Oral)   Resp 15   Wt 61.2 kg   SpO2 100%   BMI 26.37 kg/m  *** Physical Exam  ED Results and Treatments Labs (all labs ordered are listed, but only abnormal results are displayed) Labs Reviewed - No data to display                                                                                                                       EKG  EKG Interpretation Date/Time:    Ventricular Rate:    PR Interval:    QRS Duration:    QT Interval:    QTC Calculation:   R Axis:       Text Interpretation:         Radiology No results found.  Medications Ordered in ED Medications  lidocaine-EPINEPHrine (XYLOCAINE W/EPI) 2 %-1:200000 (  PF) injection 20 mL (has no administration in time range)  lidocaine-EPINEPHrine-tetracaine (LET) topical gel (has no administration in time range)   Procedures Procedures  (including critical care time) Medical Decision Making / ED Course   Medical Decision Making Risk Prescription drug management.    ***    Final Clinical Impression(s) / ED Diagnoses Final diagnoses:  None    This chart was dictated using voice recognition software.  Despite best efforts to proofread,  errors can occur which can change the documentation meaning.

## 2023-05-29 NOTE — ED Notes (Signed)
Wound cleaned and LET gel applied per order.

## 2023-05-29 NOTE — Discharge Instructions (Signed)
Do not submerge the wound under water for the next 2 weeks.   To minimize scarring, you can apply a vaseline based ointment for the next 2 weeks and keep it out of direct sun light. After that, you may apply sunscreen for the next several months.   Your Dermabond (glue) will peel off on it's own in 1-2 weeks. Please do not peel it off before this.   Return if your wound appears to be infected (see laceration care instructions).

## 2023-09-17 ENCOUNTER — Encounter (HOSPITAL_BASED_OUTPATIENT_CLINIC_OR_DEPARTMENT_OTHER): Payer: Self-pay

## 2023-09-17 ENCOUNTER — Emergency Department (HOSPITAL_BASED_OUTPATIENT_CLINIC_OR_DEPARTMENT_OTHER)

## 2023-09-17 ENCOUNTER — Emergency Department (HOSPITAL_BASED_OUTPATIENT_CLINIC_OR_DEPARTMENT_OTHER)
Admission: EM | Admit: 2023-09-17 | Discharge: 2023-09-17 | Disposition: A | Discharge status: Home / Self Care | Attending: Emergency Medicine | Admitting: Emergency Medicine

## 2023-09-17 ENCOUNTER — Other Ambulatory Visit: Payer: Self-pay

## 2023-09-17 DIAGNOSIS — M25572 Pain in left ankle and joints of left foot: Secondary | ICD-10-CM | POA: Insufficient documentation

## 2023-09-17 NOTE — ED Provider Notes (Signed)
 [facility] Provider Note   CSN: [id number] Arrival date & time: 09/17/23  1800     History Chief Complaint  Patient presents with   Ankle Pain    Andrea Carson is a 28 y.o. female self reportedly otherwise healthy presents to the ER today for evaluation of left ankle pain. The patient reports that she was taking a step out the door and inverted her ankle last night. Was wearing crocs. No other injury. No falls. Denies any numbness or tingling. No pain medication trialed. Allergic to amoxicillin.    Ankle Pain      Home Medications Prior to Admission medications   Medication Sig Start Date End Date Taking? Authorizing Provider  promethazine (PHENERGAN) 12.5 MG tablet Take 1 tablet (12.5 mg total) by mouth at bedtime. 04/13/19 12/23/19  Kooistra, Kathryn Lorraine, CNM      Allergies    Bee venom, Fire ant, and Amoxicillin    Review of Systems   Review of Systems  Musculoskeletal:  Positive for arthralgias.    Physical Exam Updated Vital Signs BP 114/66   Pulse 88   Temp 98.3 F (36.8 C) (Oral)   Resp 16   Ht 5' (1.524 m)   Wt 63.5 kg   LMP 09/05/2023   SpO2 96%   BMI 27.34 kg/m  Physical Exam Vitals and nursing note reviewed.  Constitutional:      General: She is not in acute distress.    Appearance: She is not ill-appearing or toxic-appearing.  Eyes:     General: No scleral icterus. Pulmonary:     Effort: Pulmonary effort is normal. No respiratory distress.  Musculoskeletal:        General: Tenderness present.     Right lower leg: No edema.     Left lower leg: No edema.     Comments: Tenderness to the lateral and medial malleoli on the left ankle. No tenderness into the lower leg or foot. Brisk cap refill. Palpable DP and PT pulses. Soft compartments. Coloration, temperature appear and feel symmetric bilaterally. Tattoo present to medial ankle, otherwise no overlying skin changes noted. Flexion and extension  present with some pain.   Skin:    General: Skin is warm and dry.  Neurological:     Mental Status: She is alert.     Motor: No weakness.     ED Results / Procedures / Treatments   Labs (all labs ordered are listed, but only abnormal results are displayed) Labs Reviewed - No data to display  EKG None  Radiology DG Ankle Complete Left Result Date: 09/17/2023 CLINICAL DATA:  Rolled ankle, pain EXAM: LEFT ANKLE COMPLETE - 3+ VIEW COMPARISON:  02/19/2010 FINDINGS: There is no evidence of fracture, dislocation, or joint effusion. There is no evidence of arthropathy or other focal bone abnormality. Soft tissues are unremarkable. IMPRESSION: Negative. Electronically Signed   By: Janeece Mechanic M.D.   On: 09/17/2023 19:19    Procedures Procedures   Medications Ordered in ED Medications - No data to display  ED Course/ Medical Decision Making/ A&P                               Medical Decision Making Amount and/or Complexity of Data Reviewed Radiology: ordered.   28 y.o. female presents to the ER for evaluation of ankle pain. Differential diagnosis includes but is not limited to trauma, sprain, strain, fracture, dislocation. Vital  signs unremarkable. Physical exam as noted above.   XR imaging shows negative. Per radiologist's interpretation.    Patient declined pain medication here. She is neurovascularly intact distally. No obvious deformity. No significantly swelling. Brisk cap refill. Palpable pulses. Compartments are soft. Good strength and sensation present in the foot/ankle. Will provide CAM boot, crutches, and sports medicine follow up.   We discussed the results of the labs/imaging. The plan is supportive care, CAM boot, ortho follow up. We discussed strict return precautions and red flag symptoms. The patient verbalized their understanding and agrees to the plan. The patient is stable and being discharged home in good condition.  Portions of this report may have been  transcribed using voice recognition software. Every effort was made to ensure accuracy; however, inadvertent computerized transcription errors may be present.   Final Clinical Impression(s) / ED Diagnoses Final diagnoses:  Acute left ankle pain    Rx / DC Orders ED Discharge Orders     None         Spence Dux, Kirby Peoples 09/17/23 2028    Scarlette Currier, MD 09/19/23 1335

## 2023-09-17 NOTE — Discharge Instructions (Signed)
 You were seen in the ER today for evaluation of your ankle pain. Your XR was unremarkable. This is likely a sprain. I recommend the RICE method that was discussed with you earlier. I have included more information into this discharge paperwork on this. If you continue to have pain, I have included the information on a sports medicine provider for you to follow up with as you may need further evaluation. For pain, I recommend taking 1000mg  of Tylenol and 600mg  of ibuprofen every 6 hours as needed for pain. Do not take ibuprofen if you are concerned for pregnancy. If you have any concerns, new or worsening symptoms, please return to the nearest ER for re-evaluation.   Contact a health care provider if: Your pain gets worse. Your pain does not get better with medicine. You have a fever or chills. You have more trouble walking. You have new symptoms. Your foot, leg, toes, or ankle tingles, becomes numb or swollen, or turns cold and blue.

## 2023-09-17 NOTE — ED Triage Notes (Signed)
 Pt reports twisting left ankle last night and it has been hurting since. Mild swelling. Pt able to ambulate independently with little difficulty.
